# Patient Record
Sex: Male | Born: 1940 | Race: White | Hispanic: No | Marital: Single | Smoking: Former smoker
Health system: Southern US, Community
[De-identification: ages and names within clinical notes are randomized; demographics above are authoritative.]

## PROBLEM LIST (undated history)

## (undated) DIAGNOSIS — N189 Chronic kidney disease, unspecified: Secondary | ICD-10-CM

## (undated) DIAGNOSIS — E1122 Type 2 diabetes mellitus with diabetic chronic kidney disease: Secondary | ICD-10-CM

## (undated) DIAGNOSIS — M109 Gout, unspecified: Secondary | ICD-10-CM

## (undated) DIAGNOSIS — I129 Hypertensive chronic kidney disease with stage 1 through stage 4 chronic kidney disease, or unspecified chronic kidney disease: Secondary | ICD-10-CM

## (undated) DIAGNOSIS — E119 Type 2 diabetes mellitus without complications: Secondary | ICD-10-CM

## (undated) DIAGNOSIS — I10 Essential (primary) hypertension: Secondary | ICD-10-CM

## (undated) HISTORY — DX: Chronic kidney disease, unspecified: N18.9

## (undated) HISTORY — PX: OTHER SURGICAL HISTORY: SHX169

## (undated) HISTORY — DX: Type 2 diabetes mellitus without complications: E11.9

## (undated) HISTORY — DX: Gout, unspecified: M10.9

## (undated) HISTORY — DX: Essential (primary) hypertension: I10

## (undated) HISTORY — PX: APPENDECTOMY: SHX54

---

## 2004-11-01 ENCOUNTER — Ambulatory Visit: Payer: Self-pay | Admitting: Internal Medicine

## 2008-07-30 ENCOUNTER — Inpatient Hospital Stay: Payer: Self-pay | Admitting: Internal Medicine

## 2011-05-31 DIAGNOSIS — E119 Type 2 diabetes mellitus without complications: Secondary | ICD-10-CM | POA: Diagnosis not present

## 2011-05-31 DIAGNOSIS — I10 Essential (primary) hypertension: Secondary | ICD-10-CM | POA: Diagnosis not present

## 2011-05-31 DIAGNOSIS — E7800 Pure hypercholesterolemia, unspecified: Secondary | ICD-10-CM | POA: Diagnosis not present

## 2011-05-31 DIAGNOSIS — M109 Gout, unspecified: Secondary | ICD-10-CM | POA: Diagnosis not present

## 2011-05-31 DIAGNOSIS — N189 Chronic kidney disease, unspecified: Secondary | ICD-10-CM | POA: Diagnosis not present

## 2011-05-31 DIAGNOSIS — Z125 Encounter for screening for malignant neoplasm of prostate: Secondary | ICD-10-CM | POA: Diagnosis not present

## 2011-06-29 DIAGNOSIS — H1030 Unspecified acute conjunctivitis, unspecified eye: Secondary | ICD-10-CM | POA: Diagnosis not present

## 2011-08-13 DIAGNOSIS — E119 Type 2 diabetes mellitus without complications: Secondary | ICD-10-CM | POA: Diagnosis not present

## 2011-08-13 DIAGNOSIS — E7800 Pure hypercholesterolemia, unspecified: Secondary | ICD-10-CM | POA: Diagnosis not present

## 2011-08-13 DIAGNOSIS — Z125 Encounter for screening for malignant neoplasm of prostate: Secondary | ICD-10-CM | POA: Diagnosis not present

## 2011-08-13 DIAGNOSIS — Z0000 Encounter for general adult medical examination without abnormal findings: Secondary | ICD-10-CM | POA: Diagnosis not present

## 2011-08-13 DIAGNOSIS — I10 Essential (primary) hypertension: Secondary | ICD-10-CM | POA: Diagnosis not present

## 2011-08-13 DIAGNOSIS — N189 Chronic kidney disease, unspecified: Secondary | ICD-10-CM | POA: Diagnosis not present

## 2012-02-12 DIAGNOSIS — I10 Essential (primary) hypertension: Secondary | ICD-10-CM | POA: Diagnosis not present

## 2012-02-12 DIAGNOSIS — E785 Hyperlipidemia, unspecified: Secondary | ICD-10-CM | POA: Diagnosis not present

## 2012-02-12 DIAGNOSIS — E7800 Pure hypercholesterolemia, unspecified: Secondary | ICD-10-CM | POA: Diagnosis not present

## 2012-02-12 DIAGNOSIS — E119 Type 2 diabetes mellitus without complications: Secondary | ICD-10-CM | POA: Diagnosis not present

## 2012-03-12 DIAGNOSIS — E7800 Pure hypercholesterolemia, unspecified: Secondary | ICD-10-CM | POA: Diagnosis not present

## 2012-03-12 DIAGNOSIS — I10 Essential (primary) hypertension: Secondary | ICD-10-CM | POA: Diagnosis not present

## 2014-11-01 DIAGNOSIS — E7800 Pure hypercholesterolemia, unspecified: Secondary | ICD-10-CM

## 2014-11-01 DIAGNOSIS — E118 Type 2 diabetes mellitus with unspecified complications: Secondary | ICD-10-CM

## 2014-11-01 DIAGNOSIS — I129 Hypertensive chronic kidney disease with stage 1 through stage 4 chronic kidney disease, or unspecified chronic kidney disease: Secondary | ICD-10-CM

## 2014-11-01 DIAGNOSIS — E1122 Type 2 diabetes mellitus with diabetic chronic kidney disease: Secondary | ICD-10-CM | POA: Insufficient documentation

## 2014-11-01 DIAGNOSIS — I10 Essential (primary) hypertension: Secondary | ICD-10-CM

## 2014-11-01 DIAGNOSIS — M109 Gout, unspecified: Secondary | ICD-10-CM

## 2014-11-01 DIAGNOSIS — N181 Chronic kidney disease, stage 1: Secondary | ICD-10-CM

## 2014-11-01 DIAGNOSIS — N182 Chronic kidney disease, stage 2 (mild): Secondary | ICD-10-CM

## 2014-11-01 DIAGNOSIS — Z7984 Long term (current) use of oral hypoglycemic drugs: Secondary | ICD-10-CM | POA: Insufficient documentation

## 2014-11-01 DIAGNOSIS — Z7985 Long-term (current) use of injectable non-insulin antidiabetic drugs: Secondary | ICD-10-CM | POA: Insufficient documentation

## 2014-11-01 DIAGNOSIS — E1159 Type 2 diabetes mellitus with other circulatory complications: Secondary | ICD-10-CM | POA: Insufficient documentation

## 2014-11-02 ENCOUNTER — Ambulatory Visit (INDEPENDENT_AMBULATORY_CARE_PROVIDER_SITE_OTHER): Payer: Medicare PPO | Admitting: Family Medicine

## 2014-11-02 ENCOUNTER — Encounter: Payer: Self-pay | Admitting: Family Medicine

## 2014-11-02 VITALS — BP 117/71 | HR 62 | Temp 97.8°F | Ht 68.0 in | Wt 220.0 lb

## 2014-11-02 DIAGNOSIS — I10 Essential (primary) hypertension: Secondary | ICD-10-CM | POA: Diagnosis not present

## 2014-11-02 DIAGNOSIS — E7800 Pure hypercholesterolemia, unspecified: Secondary | ICD-10-CM

## 2014-11-02 DIAGNOSIS — Z125 Encounter for screening for malignant neoplasm of prostate: Secondary | ICD-10-CM

## 2014-11-02 DIAGNOSIS — Z0000 Encounter for general adult medical examination without abnormal findings: Secondary | ICD-10-CM

## 2014-11-02 DIAGNOSIS — N182 Chronic kidney disease, stage 2 (mild): Secondary | ICD-10-CM

## 2014-11-02 DIAGNOSIS — M1000 Idiopathic gout, unspecified site: Secondary | ICD-10-CM

## 2014-11-02 DIAGNOSIS — E118 Type 2 diabetes mellitus with unspecified complications: Secondary | ICD-10-CM | POA: Diagnosis not present

## 2014-11-02 LAB — URINALYSIS, ROUTINE W REFLEX MICROSCOPIC
Bilirubin, UA: NEGATIVE
Leukocytes, UA: NEGATIVE
Nitrite, UA: NEGATIVE
PH UA: 6.5 (ref 5.0–7.5)
RBC, UA: NEGATIVE
Specific Gravity, UA: 1.02 (ref 1.005–1.030)
Urobilinogen, Ur: 1 mg/dL (ref 0.2–1.0)

## 2014-11-02 LAB — BAYER DCA HB A1C WAIVED: HB A1C (BAYER DCA - WAIVED): 7.7 % — ABNORMAL HIGH (ref <7.0)

## 2014-11-02 MED ORDER — METFORMIN HCL 500 MG PO TABS: 500.0000 mg | ORAL_TABLET | Freq: Two times a day (BID) | ORAL | Status: DC

## 2014-11-02 NOTE — Progress Notes (Signed)
BP 117/71 mmHg  Pulse 62  Temp(Src) 97.8 F (36.6 C)  Ht 5\' 8"  (1.727 m)  Wt 220 lb (99.791 kg)  BMI 33.46 kg/m2  SpO2 99%   Subjective:    [name], male    DOB: [birth year], 74 y.o.   MRN: [id number]  HPI: Jack Crane is a 74 y.o. male  Chief Complaint  Patient presents with  . Annual Exam  doing well No BP meds as has done well after stopped drinking Lipid meds only meds taken no side effects No DM meds diet controled but home checks AM fasting low 100s In PM high 100s with some excursions into low 200s  Relevant past medical, surgical, family and social history reviewed and updated as indicated. Interim medical history since our last visit reviewed. Allergies and medications reviewed and updated.  Review of Systems  Constitutional: Negative.   HENT: Negative.   Eyes: Negative.   Respiratory: Negative.   Cardiovascular: Negative.   Endocrine: Negative.   Musculoskeletal: Negative.   Skin: Negative.   Allergic/Immunologic: Negative.   Neurological: Negative.   Hematological: Negative.   Psychiatric/Behavioral: Negative.     Per HPI unless specifically indicated above     Objective:    BP 117/71 mmHg  Pulse 62  Temp(Src) 97.8 F (36.6 C)  Ht 5\' 8"  (1.727 m)  Wt 220 lb (99.791 kg)  BMI 33.46 kg/m2  SpO2 99%  Wt Readings from Last 3 Encounters:  11/02/14 220 lb (99.791 kg)  02/24/14 221 lb (100.245 kg)    Physical Exam  Constitutional: He is oriented to person, place, and time. He appears well-developed and well-nourished.  HENT:  Head: Normocephalic and atraumatic.  Right Ear: External ear normal.  Left Ear: External ear normal.  Eyes: Conjunctivae and EOM are normal. Pupils are equal, round, and reactive to light.  Neck: Normal range of motion. Neck supple.  Cardiovascular: Normal rate, regular rhythm, normal heart sounds and intact distal pulses.   Pulmonary/Chest: Effort normal and breath sounds normal.  Abdominal:  Soft. Bowel sounds are normal. There is no splenomegaly or hepatomegaly.  Genitourinary: Rectum normal, prostate normal and penis normal.  Musculoskeletal: Normal range of motion.  Neurological: He is alert and oriented to person, place, and time. He has normal reflexes.  Skin: No rash noted. No erythema.  Psychiatric: He has a normal mood and affect. His behavior is normal. Judgment and thought content normal.    No results found for this or any previous visit.    Assessment & Plan:   Problem List Items Addressed This Visit      Cardiovascular and Mediastinum   Hypertension - Primary    Diet and lifestyle controled      Relevant Orders   Comprehensive metabolic panel   CBC with Differential/Platelet   Urinalysis, Routine w reflex microscopic (not at [facility])   TSH   PSA     Endocrine   Type 2 diabetes mellitus   Relevant Medications   metFORMIN (GLUCOPHAGE) 500 MG tablet   Other Relevant Orders   Comprehensive metabolic panel   CBC with Differential/Platelet   Bayer DCA Hb A1c Waived   Urinalysis, Routine w reflex microscopic (not at [facility])   TSH   PSA     Genitourinary   RESOLVED: CKD (chronic kidney disease), stage II    resolved      Relevant Orders   Comprehensive metabolic panel   CBC with Differential/Platelet   Urinalysis, Routine w reflex  microscopic (not at [facility]   TSH   PSA     Other   Gout    No sx no meds      Relevant Orders   Comprehensive metabolic panel   CBC with Differential/Platelet   Urinalysis, Routine w reflex microscopic (not at [facility])   Uric acid   TSH   PSA   Pure hypercholesterolemia    The current medical regimen is effective;  continue present plan and medications.       Relevant Orders   Comprehensive metabolic panel   CBC with Differential/Platelet   Urinalysis, Routine w reflex microscopic (not at [facility])   TSH   PSA    Other Visit Diagnoses    PE (physical exam), annual        Relevant Orders    Comprehensive  metabolic panel    CBC with Differential/Platelet    Urinalysis, Routine w reflex microscopic (not at [facility])    TSH    PSA        Follow up plan: Return in about 3 months (around 02/02/2015) for DM check and A1C.

## 2014-11-02 NOTE — Assessment & Plan Note (Signed)
Diet and lifestyle controled

## 2014-11-02 NOTE — Assessment & Plan Note (Signed)
No sx no meds

## 2014-11-02 NOTE — Assessment & Plan Note (Signed)
The current medical regimen is effective;  continue present plan and medications.  

## 2014-11-02 NOTE — Assessment & Plan Note (Signed)
resolved 

## 2014-11-03 LAB — PSA: Prostate Specific Ag, Serum: 2.3 ng/mL (ref 0.0–4.0)

## 2014-11-03 LAB — COMPREHENSIVE METABOLIC PANEL
ALBUMIN: 4.1 g/dL (ref 3.5–4.8)
ALT: 13 IU/L (ref 0–44)
AST: 16 IU/L (ref 0–40)
Albumin/Globulin Ratio: 1.7 (ref 1.1–2.5)
Alkaline Phosphatase: 82 IU/L (ref 39–117)
BUN/Creatinine Ratio: 17 (ref 10–22)
BUN: 15 mg/dL (ref 8–27)
Bilirubin Total: 0.6 mg/dL (ref 0.0–1.2)
CALCIUM: 9 mg/dL (ref 8.6–10.2)
CO2: 23 mmol/L (ref 18–29)
Chloride: 98 mmol/L (ref 97–108)
Creatinine, Ser: 0.87 mg/dL (ref 0.76–1.27)
GFR, EST AFRICAN AMERICAN: 99 mL/min/{1.73_m2} (ref >59)
GFR, EST NON AFRICAN AMERICAN: 86 mL/min/{1.73_m2} (ref >59)
Globulin, Total: 2.4 g/dL (ref 1.5–4.5)
Glucose: 179 mg/dL — ABNORMAL HIGH (ref 65–99)
Potassium: 4.7 mmol/L (ref 3.5–5.2)
Sodium: 139 mmol/L (ref 134–144)
Total Protein: 6.5 g/dL (ref 6.0–8.5)

## 2014-11-04 LAB — CBC WITH DIFFERENTIAL/PLATELET
BASOS ABS: 0 10*3/uL (ref 0.0–0.2)
Basos: 0 %
EOS (ABSOLUTE): 0.1 10*3/uL (ref 0.0–0.4)
Eos: 2 %
Hematocrit: 44.5 % (ref 37.5–51.0)
Hemoglobin: 14.5 g/dL (ref 12.6–17.7)
Immature Grans (Abs): 0 10*3/uL (ref 0.0–0.1)
Immature Granulocytes: 0 %
Lymphocytes Absolute: 1.5 10*3/uL (ref 0.7–3.1)
Lymphs: 27 %
MCH: 30.8 pg (ref 26.6–33.0)
MCHC: 32.6 g/dL (ref 31.5–35.7)
MCV: 95 fL (ref 79–97)
MONOCYTES: 10 %
Monocytes Absolute: 0.5 10*3/uL (ref 0.1–0.9)
NEUTROS ABS: 3.3 10*3/uL (ref 1.4–7.0)
Neutrophils: 61 %
PLATELETS: 203 10*3/uL (ref 150–379)
RBC: 4.71 x10E6/uL (ref 4.14–5.80)
RDW: 13.3 % (ref 12.3–15.4)
WBC: 5.5 10*3/uL (ref 3.4–10.8)

## 2014-11-04 LAB — TSH: TSH: 2.41 u[IU]/mL (ref 0.450–4.500)

## 2014-11-04 LAB — URIC ACID: Uric Acid: 5.8 mg/dL (ref 3.7–8.6)

## 2014-12-16 DIAGNOSIS — L570 Actinic keratosis: Secondary | ICD-10-CM | POA: Diagnosis not present

## 2014-12-16 DIAGNOSIS — L578 Other skin changes due to chronic exposure to nonionizing radiation: Secondary | ICD-10-CM | POA: Diagnosis not present

## 2015-02-02 ENCOUNTER — Encounter: Payer: Self-pay | Admitting: Family Medicine

## 2015-02-02 ENCOUNTER — Ambulatory Visit (INDEPENDENT_AMBULATORY_CARE_PROVIDER_SITE_OTHER): Payer: Medicare PPO | Admitting: Family Medicine

## 2015-02-02 VITALS — BP 122/75 | HR 65 | Temp 97.8°F | Ht 68.1 in | Wt 215.0 lb

## 2015-02-02 DIAGNOSIS — I10 Essential (primary) hypertension: Secondary | ICD-10-CM | POA: Diagnosis not present

## 2015-02-02 DIAGNOSIS — E119 Type 2 diabetes mellitus without complications: Secondary | ICD-10-CM

## 2015-02-02 DIAGNOSIS — E7800 Pure hypercholesterolemia, unspecified: Secondary | ICD-10-CM | POA: Diagnosis not present

## 2015-02-02 LAB — MICROALBUMIN, URINE WAIVED
CREATININE, URINE WAIVED: 200 mg/dL (ref 10–300)
Microalb, Ur Waived: 30 mg/L — ABNORMAL HIGH (ref 0–19)

## 2015-02-02 LAB — BAYER DCA HB A1C WAIVED: HB A1C (BAYER DCA - WAIVED): 6.1 % (ref <7.0)

## 2015-02-02 MED ORDER — METFORMIN HCL 500 MG PO TABS: 500.0000 mg | ORAL_TABLET | Freq: Two times a day (BID) | ORAL | Status: DC

## 2015-02-02 NOTE — Assessment & Plan Note (Signed)
The current medical regimen is effective;  continue present plan and medications.  

## 2015-02-02 NOTE — Progress Notes (Signed)
BP 122/75 mmHg  Pulse 65  Temp(Src) 97.8 F (36.6 C)  Ht 5' 8.1" (1.73 m)  Wt 215 lb (97.523 kg)  BMI 32.58 kg/m2  SpO2 99%   Subjective:    [name], male    DOB: [birth year], 74 y.o.   MRN: [id number]  HPI: Jack Crane is a 74 y.o. male  Chief Complaint  Patient presents with  . Diabetes   patient's diabetes home glucose monitoring indicating excellent control A1c today of 6.1 patient with no low blood sugar spells doing very well. Taking Lipitor without problems no complaints. Takes medications every day without fail  Relevant past medical, surgical, family and social history reviewed and updated as indicated. Interim medical history since our last visit reviewed. Allergies and medications reviewed and updated.  Review of Systems  Constitutional: Negative.   Respiratory: Negative.   Cardiovascular: Negative.     Per HPI unless specifically indicated above     Objective:    BP 122/75 mmHg  Pulse 65  Temp(Src) 97.8 F (36.6 C)  Ht 5' 8.1" (1.73 m)  Wt 215 lb (97.523 kg)  BMI 32.58 kg/m2  SpO2 99%  Wt Readings from Last 3 Encounters:  02/02/15 215 lb (97.523 kg)  11/02/14 220 lb (99.791 kg)  02/24/14 221 lb (100.245 kg)    Physical Exam  Constitutional: He is oriented to person, place, and time. He appears well-developed and well-nourished. No distress.  HENT:  Head: Normocephalic and atraumatic.  Right Ear: Hearing normal.  Left Ear: Hearing normal.  Nose: Nose normal.  Eyes: Conjunctivae and lids are normal. Right eye exhibits no discharge. Left eye exhibits no discharge. No scleral icterus.  Cardiovascular: Normal rate, regular rhythm and normal heart sounds.   Pulmonary/Chest: Effort normal and breath sounds normal. No respiratory distress.  Musculoskeletal: Normal range of motion.  Neurological: He is alert and oriented to person, place, and time.  Skin: Skin is intact. No rash noted.  Psychiatric: He has a normal mood and  affect. His speech is normal and behavior is normal. Judgment and thought content normal. Cognition and memory are normal.    Results for orders placed or performed in visit on 11/02/14  Comprehensive metabolic panel  Result Value Ref Range   Glucose 179 (H) 65 - 99 mg/dL   BUN 15 8 - 27 mg/dL   Creatinine, Ser 0.87 0.76 - 1.27 mg/dL   GFR calc non Af Amer 86 >59 mL/min/1.73   GFR calc Af Amer 99 >59 mL/min/1.73   BUN/Creatinine Ratio 17 10 - 22   Sodium 139 134 - 144 mmol/L   Potassium 4.7 3.5 - 5.2 mmol/L   Chloride 98 97 - 108 mmol/L   CO2 23 18 - 29 mmol/L   Calcium 9.0 8.6 - 10.2 mg/dL   Total Protein 6.5 6.0 - 8.5 g/dL   Albumin 4.1 3.5 - 4.8 g/dL   Globulin, Total 2.4 1.5 - 4.5 g/dL   Albumin/Globulin Ratio 1.7 1.1 - 2.5   Bilirubin Total 0.6 0.0 - 1.2 mg/dL   Alkaline Phosphatase 82 39 - 117 IU/L   AST 16 0 - 40 IU/L   ALT 13 0 - 44 IU/L  CBC with Differential/Platelet  Result Value Ref Range   WBC 5.5 3.4 - 10.8 x10E3/uL   RBC 4.71 4.14 - 5.80 x10E6/uL   Hemoglobin 14.5 12.6 - 17.7 g/dL   Hematocrit 44.5 37.5 - 51.0 %   MCV 95 79 - 97 fL  MCH 30.8 26.6 - 33.0 pg   MCHC 32.6 31.5 - 35.7 g/dL   RDW 13.3 12.3 - 15.4 %   Platelets 203 150 - 379 x10E3/uL   Neutrophils 61 %   Lymphs 27 %   Monocytes 10 %   Eos 2 %   Basos 0 %   Neutrophils Absolute 3.3 1.4 - 7.0 x10E3/uL   Lymphocytes Absolute 1.5 0.7 - 3.1 x10E3/uL   Monocytes Absolute 0.5 0.1 - 0.9 x10E3/uL   EOS (ABSOLUTE) 0.1 0.0 - 0.4 x10E3/uL   Basophils Absolute 0.0 0.0 - 0.2 x10E3/uL   Immature Granulocytes 0 %   Immature Grans (Abs) 0.0 0.0 - 0.1 x10E3/uL  Bayer DCA Hb A1c Waived  Result Value Ref Range   Bayer DCA Hb A1c Waived 7.7 (H) <7.0 %  Urinalysis, Routine w reflex microscopic (not at [facility])  Result Value Ref Range   Specific Gravity, UA 1.020 1.005 - 1.030   pH, UA 6.5 5.0 - 7.5   Color, UA Yellow Yellow   Appearance Ur Clear Clear   Leukocytes, UA Negative Negative   Protein, UA Trace  Negative/Trace   Glucose, UA Trace (A) Negative   Ketones, UA Trace (A) Negative   RBC, UA Negative Negative   Bilirubin, UA Negative Negative   Urobilinogen, Ur 1.0 0.2 - 1.0 mg/dL   Nitrite, UA Negative Negative  Uric acid  Result Value Ref Range   Uric Acid 5.8 3.7 - 8.6 mg/dL  TSH  Result Value Ref Range   TSH 2.410 0.450 - 4.500 uIU/mL  PSA  Result Value Ref Range   Prostate Specific Ag, Serum 2.3 0.0 - 4.0 ng/mL      Assessment & Plan:   Problem List Items Addressed This Visit      Cardiovascular and Mediastinum   Hypertension    The current medical regimen is effective;  continue present plan and medications.         Endocrine   Type 2 diabetes mellitus ([place])    The current medical regimen is effective;  continue present plan and medications.       Relevant Medications   metFORMIN (GLUCOPHAGE) 500 MG tablet     Other   Pure hypercholesterolemia    The current medical regimen is effective;  continue present plan and medications.        Other Visit Diagnoses    Diabetes mellitus without complication ([place])    -  Primary    Relevant Medications    metFORMIN (GLUCOPHAGE) 500 MG tablet    Other Relevant Orders    Bayer DCA Hb A1c Waived    Microalbumin, Urine Waived        Follow up plan: Return in about 3 months (around 05/05/2015) for Recheck diabetes A1c, BMP, lipid panel, ALT, AST.

## 2015-02-03 ENCOUNTER — Telehealth: Payer: Self-pay | Admitting: Family Medicine

## 2015-02-03 NOTE — Telephone Encounter (Signed)
Documented in chart.

## 2015-02-03 NOTE — Telephone Encounter (Signed)
Pt came in stated he does not need a colonoscopy done. He had one 10/16/12 with Vella Kohler, MD. Pt brought record a copy of discharge instructions. Please call if this is still needed. Thanks.

## 2015-02-04 DIAGNOSIS — E119 Type 2 diabetes mellitus without complications: Secondary | ICD-10-CM | POA: Diagnosis not present

## 2015-02-04 LAB — HM DIABETES EYE EXAM

## 2015-04-22 DIAGNOSIS — J209 Acute bronchitis, unspecified: Secondary | ICD-10-CM | POA: Diagnosis not present

## 2015-05-26 ENCOUNTER — Ambulatory Visit (INDEPENDENT_AMBULATORY_CARE_PROVIDER_SITE_OTHER): Payer: Medicare PPO | Admitting: Family Medicine

## 2015-05-26 ENCOUNTER — Encounter: Payer: Self-pay | Admitting: Family Medicine

## 2015-05-26 VITALS — BP 107/70 | HR 66 | Temp 97.4°F | Ht 68.7 in | Wt 215.0 lb

## 2015-05-26 DIAGNOSIS — E119 Type 2 diabetes mellitus without complications: Secondary | ICD-10-CM | POA: Diagnosis not present

## 2015-05-26 DIAGNOSIS — I10 Essential (primary) hypertension: Secondary | ICD-10-CM

## 2015-05-26 DIAGNOSIS — E7800 Pure hypercholesterolemia, unspecified: Secondary | ICD-10-CM

## 2015-05-26 LAB — LP+ALT+AST PICCOLO, WAIVED
ALT (SGPT) Piccolo, Waived: 23 U/L (ref 10–47)
AST (SGOT) Piccolo, Waived: 26 U/L (ref 11–38)
CHOLESTEROL PICCOLO, WAIVED: 114 mg/dL (ref <200)
Chol/HDL Ratio Piccolo,Waive: 3.2 mg/dL
HDL CHOL PICCOLO, WAIVED: 36 mg/dL — AB (ref >59)
LDL CHOL CALC PICCOLO WAIVED: 55 mg/dL (ref <100)
TRIGLYCERIDES PICCOLO,WAIVED: 117 mg/dL (ref <150)
VLDL CHOL CALC PICCOLO,WAIVE: 23 mg/dL (ref <30)

## 2015-05-26 LAB — BAYER DCA HB A1C WAIVED: HB A1C (BAYER DCA - WAIVED): 6.4 % (ref <7.0)

## 2015-05-26 MED ORDER — TRIAMCINOLONE ACETONIDE 55 MCG/ACT NA AERO: 2.0000 | INHALATION_SPRAY | Freq: Every day | NASAL | Status: DC

## 2015-05-26 NOTE — Assessment & Plan Note (Signed)
The current medical regimen is effective;  continue present plan and medications.  

## 2015-05-26 NOTE — Assessment & Plan Note (Signed)
Diet controled 

## 2015-05-26 NOTE — Progress Notes (Signed)
BP 107/70 mmHg  Pulse 66  Temp(Src) 97.4 F (36.3 C)  Ht 5' 8.7" (1.745 m)  Wt 215 lb (97.523 kg)  BMI 32.03 kg/m2  SpO2 98%   Subjective:    [name], male    DOB: August 10, 1940, 76 y.o.   MRN: [id number]  HPI: Jack Crane is a 75 y.o. male  Chief Complaint  Patient presents with  . Diabetes  . Hypertension  . Hyperlipidemia   Patient all in all doing well had a cold earlier this year which is resolved but still has some nasal drip which is bothersome. Blood pressures remained good not taking medications Blood sugars doing well with no low blood sugar spells Taking cholesterol medicine without problems or issues No side effects and taking medications faithfully No gout signs or symptoms.  Relevant past medical, surgical, family and social history reviewed and updated as indicated. Interim medical history since our last visit reviewed. Allergies and medications reviewed and updated.  Review of Systems  Constitutional: Negative.   Respiratory: Negative.   Cardiovascular: Negative.     Per HPI unless specifically indicated above     Objective:    BP 107/70 mmHg  Pulse 66  Temp(Src) 97.4 F (36.3 C)  Ht 5' 8.7" (1.745 m)  Wt 215 lb (97.523 kg)  BMI 32.03 kg/m2  SpO2 98%  Wt Readings from Last 3 Encounters:  05/26/15 215 lb (97.523 kg)  02/02/15 215 lb (97.523 kg)  11/02/14 220 lb (99.791 kg)    Physical Exam  Constitutional: He is oriented to person, place, and time. He appears well-developed and well-nourished. No distress.  HENT:  Head: Normocephalic and atraumatic.  Right Ear: Hearing normal.  Left Ear: Hearing normal.  Nose: Nose normal.  Eyes: Conjunctivae and lids are normal. Right eye exhibits no discharge. Left eye exhibits no discharge. No scleral icterus.  Cardiovascular: Normal rate, regular rhythm and normal heart sounds.   Pulmonary/Chest: Effort normal and breath sounds normal. No respiratory distress.   Musculoskeletal: Normal range of motion.  Neurological: He is alert and oriented to person, place, and time.  Skin: Skin is intact. No rash noted.  Psychiatric: He has a normal mood and affect. His speech is normal and behavior is normal. Judgment and thought content normal. Cognition and memory are normal.    Results for orders placed or performed in visit on 02/02/15  Bayer DCA Hb A1c Waived  Result Value Ref Range   Bayer DCA Hb A1c Waived 6.1 <7.0 %  Microalbumin, Urine Waived  Result Value Ref Range   Microalb, Ur Waived 30 (H) 0 - 19 mg/L   Creatinine, Urine Waived 200 10 - 300 mg/dL   Microalb/Creat Ratio <30 <30 mg/g      Assessment & Plan:   Problem List Items Addressed This Visit      Cardiovascular and Mediastinum   Hypertension    Diet controled      Relevant Orders   Basic metabolic panel     Endocrine   Type 2 diabetes mellitus ([name]) - Primary    The current medical regimen is effective;  continue present plan and medications.       Relevant Orders   Bayer DCA Hb A1c Waived     Other   Pure hypercholesterolemia    The current medical regimen is effective;  continue present plan and medications.       Relevant Orders   [address], Waived  Follow up plan: Return in about 3 months (around 08/23/2015) for a1c.

## 2015-05-27 LAB — BASIC METABOLIC PANEL
BUN/Creatinine Ratio: 15 (ref 10–22)
BUN: 15 mg/dL (ref 8–27)
CALCIUM: 9.3 mg/dL (ref 8.6–10.2)
CO2: 25 mmol/L (ref 18–29)
Chloride: 103 mmol/L (ref 96–106)
Creatinine, Ser: 0.99 mg/dL (ref 0.76–1.27)
GFR, EST AFRICAN AMERICAN: 86 mL/min/{1.73_m2} (ref >59)
GFR, EST NON AFRICAN AMERICAN: 75 mL/min/{1.73_m2} (ref >59)
Glucose: 183 mg/dL — ABNORMAL HIGH (ref 65–99)
Potassium: 4.7 mmol/L (ref 3.5–5.2)
Sodium: 145 mmol/L — ABNORMAL HIGH (ref 134–144)

## 2015-05-28 ENCOUNTER — Encounter: Payer: Self-pay | Admitting: Family Medicine

## 2015-06-29 DIAGNOSIS — L820 Inflamed seborrheic keratosis: Secondary | ICD-10-CM | POA: Diagnosis not present

## 2015-06-29 DIAGNOSIS — L821 Other seborrheic keratosis: Secondary | ICD-10-CM | POA: Diagnosis not present

## 2015-06-29 DIAGNOSIS — L578 Other skin changes due to chronic exposure to nonionizing radiation: Secondary | ICD-10-CM | POA: Diagnosis not present

## 2015-06-29 DIAGNOSIS — D692 Other nonthrombocytopenic purpura: Secondary | ICD-10-CM | POA: Diagnosis not present

## 2015-06-29 DIAGNOSIS — L570 Actinic keratosis: Secondary | ICD-10-CM | POA: Diagnosis not present

## 2015-08-24 ENCOUNTER — Encounter: Payer: Self-pay | Admitting: Family Medicine

## 2015-08-24 ENCOUNTER — Ambulatory Visit (INDEPENDENT_AMBULATORY_CARE_PROVIDER_SITE_OTHER): Payer: Medicare PPO | Admitting: Family Medicine

## 2015-08-24 VITALS — BP 128/66 | HR 59 | Temp 97.7°F | Ht 68.7 in | Wt 222.0 lb

## 2015-08-24 DIAGNOSIS — M1000 Idiopathic gout, unspecified site: Secondary | ICD-10-CM

## 2015-08-24 DIAGNOSIS — E7800 Pure hypercholesterolemia, unspecified: Secondary | ICD-10-CM

## 2015-08-24 DIAGNOSIS — I10 Essential (primary) hypertension: Secondary | ICD-10-CM | POA: Diagnosis not present

## 2015-08-24 DIAGNOSIS — E119 Type 2 diabetes mellitus without complications: Secondary | ICD-10-CM | POA: Diagnosis not present

## 2015-08-24 LAB — HEMOGLOBIN A1C: HEMOGLOBIN A1C: 6

## 2015-08-24 LAB — BAYER DCA HB A1C WAIVED: HB A1C (BAYER DCA - WAIVED): 6.6 % (ref <7.0)

## 2015-08-24 NOTE — Progress Notes (Signed)
   BP 128/66 mmHg  Pulse 59  Temp(Src) 97.7 F (36.5 C)  Ht 5' 8.7" (1.745 m)  Wt 222 lb (100.699 kg)  BMI 33.07 kg/m2  SpO2 96%   Subjective:    [name], male    DOB: [birth year], 75 y.o.   MRN: [id number]  HPI: Jack Crane is a 75 y.o. male  Chief Complaint  Patient presents with  . Diabetes   Patient recheck doing well noted low blood sugar spells no issues with metformin taking faithfully without side effects Cholesterol doing well no complaints from medication taken faithfully Blood pressure remains to be well controlled not taking any medications Relevant past medical, surgical, family and social history reviewed and updated as indicated. Interim medical history since our last visit reviewed. Allergies and medications reviewed and updated.  Review of Systems  Constitutional: Negative.   Respiratory: Negative.   Cardiovascular: Negative.     Per HPI unless specifically indicated above     Objective:    BP 128/66 mmHg  Pulse 59  Temp(Src) 97.7 F (36.5 C)  Ht 5' 8.7" (1.745 m)  Wt 222 lb (100.699 kg)  BMI 33.07 kg/m2  SpO2 96%  Wt Readings from Last 3 Encounters:  08/24/15 222 lb (100.699 kg)  05/26/15 215 lb (97.523 kg)  02/02/15 215 lb (97.523 kg)    Physical Exam  Constitutional: He is oriented to person, place, and time. He appears well-developed and well-nourished. No distress.  HENT:  Head: Normocephalic and atraumatic.  Right Ear: Hearing normal.  Left Ear: Hearing normal.  Nose: Nose normal.  Eyes: Conjunctivae and lids are normal. Right eye exhibits no discharge. Left eye exhibits no discharge. No scleral icterus.  Cardiovascular: Normal rate, regular rhythm and normal heart sounds.   Pulmonary/Chest: Effort normal and breath sounds normal. No respiratory distress.  Musculoskeletal: Normal range of motion.  Neurological: He is alert and oriented to person, place, and time.  Skin: Skin is intact. No rash noted.   Psychiatric: He has a normal mood and affect. His speech is normal and behavior is normal. Judgment and thought content normal. Cognition and memory are normal.    Results for orders placed or performed in visit on 08/24/15  HM DIABETES EYE EXAM  Result Value Ref Range   HM Diabetic Eye Exam No Retinopathy No Retinopathy      Assessment & Plan:   Problem List Items Addressed This Visit      Cardiovascular and Mediastinum   Hypertension    controled        Endocrine   Type 2 diabetes mellitus ([place]) - Primary    The current medical regimen is effective;  continue present plan and medications.       Relevant Orders   HM DIABETES EYE EXAM (Completed)   Bayer DCA Hb A1c Waived     Other   Gout    No gout symptoms      Pure hypercholesterolemia    The current medical regimen is effective;  continue present plan and medications.           Follow up plan: Return in about 3 months (around 11/24/2015) for Physical Exam a1c, PE labs, uric acid.

## 2015-08-24 NOTE — Assessment & Plan Note (Signed)
The current medical regimen is effective;  continue present plan and medications.  

## 2015-08-24 NOTE — Assessment & Plan Note (Signed)
controled

## 2015-08-24 NOTE — Assessment & Plan Note (Signed)
No gout symptoms 

## 2015-10-29 ENCOUNTER — Other Ambulatory Visit: Payer: Self-pay | Admitting: Family Medicine

## 2015-11-03 DIAGNOSIS — L719 Rosacea, unspecified: Secondary | ICD-10-CM | POA: Diagnosis not present

## 2015-11-03 DIAGNOSIS — L578 Other skin changes due to chronic exposure to nonionizing radiation: Secondary | ICD-10-CM | POA: Diagnosis not present

## 2015-11-03 DIAGNOSIS — L820 Inflamed seborrheic keratosis: Secondary | ICD-10-CM | POA: Diagnosis not present

## 2015-11-03 DIAGNOSIS — L570 Actinic keratosis: Secondary | ICD-10-CM | POA: Diagnosis not present

## 2015-12-01 ENCOUNTER — Encounter: Payer: Self-pay | Admitting: Family Medicine

## 2015-12-01 ENCOUNTER — Ambulatory Visit (INDEPENDENT_AMBULATORY_CARE_PROVIDER_SITE_OTHER): Payer: Medicare PPO | Admitting: Family Medicine

## 2015-12-01 VITALS — BP 122/68 | HR 61 | Temp 97.8°F | Ht 68.7 in | Wt 222.0 lb

## 2015-12-01 DIAGNOSIS — Z0000 Encounter for general adult medical examination without abnormal findings: Secondary | ICD-10-CM | POA: Diagnosis not present

## 2015-12-01 DIAGNOSIS — E7800 Pure hypercholesterolemia, unspecified: Secondary | ICD-10-CM | POA: Diagnosis not present

## 2015-12-01 DIAGNOSIS — E119 Type 2 diabetes mellitus without complications: Secondary | ICD-10-CM

## 2015-12-01 DIAGNOSIS — I10 Essential (primary) hypertension: Secondary | ICD-10-CM

## 2015-12-01 DIAGNOSIS — M1000 Idiopathic gout, unspecified site: Secondary | ICD-10-CM | POA: Diagnosis not present

## 2015-12-01 LAB — URINALYSIS, ROUTINE W REFLEX MICROSCOPIC
Bilirubin, UA: NEGATIVE
Glucose, UA: NEGATIVE
Ketones, UA: NEGATIVE
LEUKOCYTES UA: NEGATIVE
NITRITE UA: NEGATIVE
PH UA: 5.5 (ref 5.0–7.5)
Specific Gravity, UA: 1.02 (ref 1.005–1.030)
Urobilinogen, Ur: 0.2 mg/dL (ref 0.2–1.0)

## 2015-12-01 LAB — MICROALBUMIN, URINE WAIVED
CREATININE, URINE WAIVED: 200 mg/dL (ref 10–300)
Microalb, Ur Waived: 30 mg/L — ABNORMAL HIGH (ref 0–19)

## 2015-12-01 LAB — MICROSCOPIC EXAMINATION
EPITHELIAL CELLS (NON RENAL): NONE SEEN /HPF (ref 0–10)
WBC, UA: NONE SEEN /hpf (ref 0–?)

## 2015-12-01 LAB — HEMOGLOBIN A1C: HEMOGLOBIN A1C: 7.4

## 2015-12-01 LAB — MICROALBUMIN, URINE: MICROALB UR: 30

## 2015-12-01 LAB — BAYER DCA HB A1C WAIVED: HB A1C (BAYER DCA - WAIVED): 7.4 % — ABNORMAL HIGH (ref <7.0)

## 2015-12-01 MED ORDER — ATORVASTATIN CALCIUM 40 MG PO TABS: 40.0000 mg | ORAL_TABLET | Freq: Every day | ORAL | 4 refills | Status: DC

## 2015-12-01 MED ORDER — METFORMIN HCL 500 MG PO TABS: 500.0000 mg | ORAL_TABLET | Freq: Two times a day (BID) | ORAL | 4 refills | Status: DC

## 2015-12-01 NOTE — Assessment & Plan Note (Signed)
The current medical regimen is effective;  continue present plan and medications.  

## 2015-12-01 NOTE — Progress Notes (Signed)
BP 122/68 (BP Location: Right Arm, Patient Position: Sitting, Cuff Size: Small)   Pulse 61   Temp 97.8 F (36.6 C)   Ht 5' 8.7" (1.745 m)   Wt 222 lb (100.7 kg)   SpO2 95%   BMI 33.07 kg/m    Subjective:    [name], male    DOB: 1940/07/11, 75 y.o.   MRN: [id number]  HPI: Jack Crane is a 75 y.o. male  Chief Complaint  Patient presents with  . Annual Exam  . Diabetes  . Gout   Patient all in all doing well noted low blood sugar spells no issues with medications. Home glucose checks generally in the evening sometimes of been as high as 200 mostly in the mid 100s. All in all doing well Cholesterol no issues with Lipitor No further gout symptoms Not taking any gout medicines Relevant past medical, surgical, family and social history reviewed and updated as indicated. Interim medical history since our last visit reviewed. Allergies and medications reviewed and updated.  Review of Systems  Constitutional: Negative.   HENT: Negative.   Eyes: Negative.   Respiratory: Negative.   Cardiovascular: Negative.   Gastrointestinal: Negative.   Endocrine: Negative.   Genitourinary: Negative.   Musculoskeletal: Negative.   Skin: Negative.   Allergic/Immunologic: Negative.   Neurological: Negative.   Hematological: Negative.   Psychiatric/Behavioral: Negative.     Per HPI unless specifically indicated above     Objective:    BP 122/68 (BP Location: Right Arm, Patient Position: Sitting, Cuff Size: Small)   Pulse 61   Temp 97.8 F (36.6 C)   Ht 5' 8.7" (1.745 m)   Wt 222 lb (100.7 kg)   SpO2 95%   BMI 33.07 kg/m   Wt Readings from Last 3 Encounters:  12/01/15 222 lb (100.7 kg)  08/24/15 222 lb (100.7 kg)  05/26/15 215 lb (97.5 kg)    Physical Exam  Constitutional: He is oriented to person, place, and time. He appears well-developed and well-nourished.  HENT:  Head: Normocephalic and atraumatic.  Right Ear: External ear normal.  Left Ear:  External ear normal.  Eyes: Conjunctivae and EOM are normal. Pupils are equal, round, and reactive to light.  Neck: Normal range of motion. Neck supple.  Cardiovascular: Normal rate, regular rhythm, normal heart sounds and intact distal pulses.   Pulmonary/Chest: Effort normal and breath sounds normal.  Abdominal: Soft. Bowel sounds are normal. There is no splenomegaly or hepatomegaly.  Genitourinary: Rectum normal, prostate normal and penis normal.  Musculoskeletal: Normal range of motion.  Neurological: He is alert and oriented to person, place, and time. He has normal reflexes.  Skin: No rash noted. No erythema.  Psychiatric: He has a normal mood and affect. His behavior is normal. Judgment and thought content normal.    Results for orders placed or performed in visit on 12/01/15  Microalbumin, urine  Result Value Ref Range   Microalb, Ur 30   Hemoglobin A1c  Result Value Ref Range   Hemoglobin A1C 7.4       Assessment & Plan:   Problem List Items Addressed This Visit      Cardiovascular and Mediastinum   Hypertension    The current medical regimen is effective;  continue present plan and medications.       Relevant Medications   atorvastatin (LIPITOR) 40 MG tablet     Endocrine   Type 2 diabetes mellitus ([facility])    A1c slightly elevated but  all in all doing well      Relevant Medications   metFORMIN (GLUCOPHAGE) 500 MG tablet   atorvastatin (LIPITOR) 40 MG tablet   Other Relevant Orders   Microalbumin, Urine Waived   Bayer DCA Hb A1c Waived     Other   Gout    No symptoms uric acid level pending      Relevant Orders   Uric acid   Pure hypercholesterolemia    The current medical regimen is effective;  continue present plan and medications.       Relevant Medications   atorvastatin (LIPITOR) 40 MG tablet    Other Visit Diagnoses    Well adult exam    -  Primary   Relevant Orders   CBC with Differential/Platelet   Comprehensive metabolic panel   Lipid  Panel w/o Chol/HDL Ratio   PSA   TSH   Urinalysis, Routine w reflex microscopic (not at [facility])       Follow up plan: Return in about 3 months (around 03/02/2016) for Hemoglobin A1c.

## 2015-12-01 NOTE — Assessment & Plan Note (Signed)
No symptoms uric acid level pending

## 2015-12-01 NOTE — Assessment & Plan Note (Signed)
A1c slightly elevated but all in all doing well

## 2015-12-02 LAB — CBC WITH DIFFERENTIAL/PLATELET
BASOS ABS: 0 10*3/uL (ref 0.0–0.2)
BASOS: 1 %
EOS (ABSOLUTE): 0.2 10*3/uL (ref 0.0–0.4)
EOS: 3 %
HEMATOCRIT: 44.1 % (ref 37.5–51.0)
HEMOGLOBIN: 14.8 g/dL (ref 12.6–17.7)
IMMATURE GRANS (ABS): 0 10*3/uL (ref 0.0–0.1)
Immature Granulocytes: 0 %
LYMPHS: 25 %
Lymphocytes Absolute: 1.3 10*3/uL (ref 0.7–3.1)
MCH: 31 pg (ref 26.6–33.0)
MCHC: 33.6 g/dL (ref 31.5–35.7)
MCV: 92 fL (ref 79–97)
MONOCYTES: 11 %
Monocytes Absolute: 0.6 10*3/uL (ref 0.1–0.9)
NEUTROS ABS: 3 10*3/uL (ref 1.4–7.0)
Neutrophils: 60 %
Platelets: 182 10*3/uL (ref 150–379)
RBC: 4.78 x10E6/uL (ref 4.14–5.80)
RDW: 13.8 % (ref 12.3–15.4)
WBC: 5.1 10*3/uL (ref 3.4–10.8)

## 2015-12-02 LAB — URIC ACID: URIC ACID: 6.8 mg/dL (ref 3.7–8.6)

## 2015-12-02 LAB — COMPREHENSIVE METABOLIC PANEL
ALBUMIN: 4.2 g/dL (ref 3.5–4.8)
ALK PHOS: 78 IU/L (ref 39–117)
ALT: 22 IU/L (ref 0–44)
AST: 26 IU/L (ref 0–40)
Albumin/Globulin Ratio: 1.5 (ref 1.2–2.2)
BUN / CREAT RATIO: 13 (ref 10–24)
BUN: 13 mg/dL (ref 8–27)
Bilirubin Total: 0.6 mg/dL (ref 0.0–1.2)
CO2: 28 mmol/L (ref 18–29)
CREATININE: 1 mg/dL (ref 0.76–1.27)
Calcium: 9.5 mg/dL (ref 8.6–10.2)
Chloride: 103 mmol/L (ref 96–106)
GFR calc Af Amer: 85 mL/min/{1.73_m2} (ref >59)
GFR, EST NON AFRICAN AMERICAN: 74 mL/min/{1.73_m2} (ref >59)
GLUCOSE: 115 mg/dL — AB (ref 65–99)
Globulin, Total: 2.8 g/dL (ref 1.5–4.5)
Potassium: 5.7 mmol/L — ABNORMAL HIGH (ref 3.5–5.2)
SODIUM: 145 mmol/L — AB (ref 134–144)
TOTAL PROTEIN: 7 g/dL (ref 6.0–8.5)

## 2015-12-02 LAB — LIPID PANEL W/O CHOL/HDL RATIO
CHOLESTEROL TOTAL: 127 mg/dL (ref 100–199)
HDL: 34 mg/dL — ABNORMAL LOW (ref >39)
LDL CALC: 71 mg/dL (ref 0–99)
TRIGLYCERIDES: 110 mg/dL (ref 0–149)
VLDL CHOLESTEROL CAL: 22 mg/dL (ref 5–40)

## 2015-12-02 LAB — TSH: TSH: 2.95 u[IU]/mL (ref 0.450–4.500)

## 2015-12-02 LAB — PSA: PROSTATE SPECIFIC AG, SERUM: 2.5 ng/mL (ref 0.0–4.0)

## 2015-12-05 ENCOUNTER — Telehealth: Payer: Self-pay | Admitting: Family Medicine

## 2015-12-05 DIAGNOSIS — E875 Hyperkalemia: Secondary | ICD-10-CM

## 2015-12-05 NOTE — Telephone Encounter (Signed)
Phone call Discussed with patient elevated potassium will recheck BMP at some point in the next week or so patient not doing next her potassium supplements.

## 2015-12-06 ENCOUNTER — Other Ambulatory Visit: Payer: Medicare PPO

## 2015-12-06 DIAGNOSIS — E875 Hyperkalemia: Secondary | ICD-10-CM | POA: Diagnosis not present

## 2015-12-07 ENCOUNTER — Encounter: Payer: Self-pay | Admitting: Family Medicine

## 2015-12-07 LAB — BASIC METABOLIC PANEL
BUN/Creatinine Ratio: 12 (ref 10–24)
BUN: 13 mg/dL (ref 8–27)
CALCIUM: 8.7 mg/dL (ref 8.6–10.2)
CHLORIDE: 102 mmol/L (ref 96–106)
CO2: 24 mmol/L (ref 18–29)
Creatinine, Ser: 1.06 mg/dL (ref 0.76–1.27)
GFR calc non Af Amer: 69 mL/min/{1.73_m2} (ref >59)
GFR, EST AFRICAN AMERICAN: 80 mL/min/{1.73_m2} (ref >59)
GLUCOSE: 162 mg/dL — AB (ref 65–99)
POTASSIUM: 4.2 mmol/L (ref 3.5–5.2)
Sodium: 143 mmol/L (ref 134–144)

## 2016-02-09 DIAGNOSIS — L718 Other rosacea: Secondary | ICD-10-CM | POA: Diagnosis not present

## 2016-02-09 DIAGNOSIS — L578 Other skin changes due to chronic exposure to nonionizing radiation: Secondary | ICD-10-CM | POA: Diagnosis not present

## 2016-02-09 DIAGNOSIS — L820 Inflamed seborrheic keratosis: Secondary | ICD-10-CM | POA: Diagnosis not present

## 2016-02-09 DIAGNOSIS — L570 Actinic keratosis: Secondary | ICD-10-CM | POA: Diagnosis not present

## 2016-02-09 DIAGNOSIS — L821 Other seborrheic keratosis: Secondary | ICD-10-CM | POA: Diagnosis not present

## 2016-03-07 ENCOUNTER — Ambulatory Visit (INDEPENDENT_AMBULATORY_CARE_PROVIDER_SITE_OTHER): Payer: Medicare PPO | Admitting: Family Medicine

## 2016-03-07 ENCOUNTER — Encounter: Payer: Self-pay | Admitting: Family Medicine

## 2016-03-07 VITALS — BP 128/80 | HR 66 | Temp 97.4°F | Wt 224.4 lb

## 2016-03-07 DIAGNOSIS — M1000 Idiopathic gout, unspecified site: Secondary | ICD-10-CM | POA: Diagnosis not present

## 2016-03-07 DIAGNOSIS — E7800 Pure hypercholesterolemia, unspecified: Secondary | ICD-10-CM | POA: Diagnosis not present

## 2016-03-07 DIAGNOSIS — E118 Type 2 diabetes mellitus with unspecified complications: Secondary | ICD-10-CM

## 2016-03-07 DIAGNOSIS — I10 Essential (primary) hypertension: Secondary | ICD-10-CM

## 2016-03-07 LAB — BAYER DCA HB A1C WAIVED: HB A1C (BAYER DCA - WAIVED): 7.7 % — ABNORMAL HIGH (ref <7.0)

## 2016-03-07 MED ORDER — BENAZEPRIL HCL 10 MG PO TABS: 10.0000 mg | ORAL_TABLET | Freq: Every day | ORAL | 1 refills | Status: DC

## 2016-03-07 NOTE — Addendum Note (Signed)
Addended byGolden Pop on: 03/07/2016 02:53 PM   Modules accepted: Orders

## 2016-03-07 NOTE — Assessment & Plan Note (Signed)
The current medical regimen is effective;  continue present plan and medications.  

## 2016-03-07 NOTE — Assessment & Plan Note (Signed)
Discuss renal function essentially normal but declining and with no intervention would certainly become abnormal. Will start Benzapril 10 mg reviewed with patient. Recheck BMP 1 month no office visit needed doesn't need to be fasting.

## 2016-03-07 NOTE — Progress Notes (Signed)
BP 128/80 (BP Location: Left Arm, Patient Position: Sitting, Cuff Size: Large)   Pulse 66   Temp 97.4 F (36.3 C)   Wt 224 lb 6.4 oz (101.8 kg)   SpO2 97%   BMI 33.43 kg/m    Subjective:    [name], male    DOB: 01-Jul-1940, 75 y.o.   MRN: [id number]  HPI: Jack Crane is a 75 y.o. male  Chief Complaint  Patient presents with  . Diabetes   Patient follow-up medical problems doing well diabetes well controlled no low blood sugar spells or issues with medications. Gout no symptoms not taking medicines  Hypertension, hypercholesterol doing well no issues with medications taken faithfully. Relevant past medical, surgical, family and social history reviewed and updated as indicated. Interim medical history since our last visit reviewed. Allergies and medications reviewed and updated.  Review of Systems  Constitutional: Negative.   Respiratory: Negative.   Cardiovascular: Negative.     Per HPI unless specifically indicated above     Objective:    BP 128/80 (BP Location: Left Arm, Patient Position: Sitting, Cuff Size: Large)   Pulse 66   Temp 97.4 F (36.3 C)   Wt 224 lb 6.4 oz (101.8 kg)   SpO2 97%   BMI 33.43 kg/m   Wt Readings from Last 3 Encounters:  03/07/16 224 lb 6.4 oz (101.8 kg)  12/01/15 222 lb (100.7 kg)  08/24/15 222 lb (100.7 kg)    Physical Exam  Constitutional: He is oriented to person, place, and time. He appears well-developed and well-nourished. No distress.  HENT:  Head: Normocephalic and atraumatic.  Right Ear: Hearing normal.  Left Ear: Hearing normal.  Nose: Nose normal.  Eyes: Conjunctivae and lids are normal. Right eye exhibits no discharge. Left eye exhibits no discharge. No scleral icterus.  Cardiovascular: Normal rate, regular rhythm and normal heart sounds.   Pulmonary/Chest: Effort normal and breath sounds normal. No respiratory distress.  Musculoskeletal: Normal range of motion.  Neurological: He is alert and  oriented to person, place, and time.  Skin: Skin is intact. No rash noted.  Psychiatric: He has a normal mood and affect. His speech is normal and behavior is normal. Judgment and thought content normal. Cognition and memory are normal.    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 Will start Benzapril 10 mg reviewed with patient. Recheck BMP 1 month no office visit needed doesn't need to be fasting.      Hypertension    The current medical regimen is effective;  continue  present plan and medications.         Other   Gout    The current medical regimen is effective;  continue present plan and medications.       Pure hypercholesterolemia    The current medical regimen is effective;  continue present plan and medications.           Follow up plan: Return in about 3 months (around 06/07/2016) for Hemoglobin A1c, BMP,  Lipids, ALT, AST.

## 2016-03-26 DIAGNOSIS — E119 Type 2 diabetes mellitus without complications: Secondary | ICD-10-CM | POA: Diagnosis not present

## 2016-03-26 LAB — HM DIABETES EYE EXAM

## 2016-04-09 ENCOUNTER — Telehealth: Payer: Self-pay | Admitting: Family Medicine

## 2016-04-09 ENCOUNTER — Other Ambulatory Visit: Payer: Medicare PPO

## 2016-04-09 DIAGNOSIS — E118 Type 2 diabetes mellitus with unspecified complications: Secondary | ICD-10-CM

## 2016-04-09 NOTE — Telephone Encounter (Signed)
Pt came in for labs stated he never received a call or letter about his test results from his last visit. Please call pt ASAP to follow up. Thanks.

## 2016-04-09 NOTE — Telephone Encounter (Signed)
Called and left a message letting patient know that his labs went to his my chart account.

## 2016-04-09 NOTE — Telephone Encounter (Signed)
Pt would like a call back

## 2016-04-10 ENCOUNTER — Encounter: Payer: Self-pay | Admitting: Family Medicine

## 2016-04-10 LAB — BASIC METABOLIC PANEL
BUN / CREAT RATIO: 13 (ref 10–24)
BUN: 13 mg/dL (ref 8–27)
CO2: 28 mmol/L (ref 18–29)
CREATININE: 0.98 mg/dL (ref 0.76–1.27)
Calcium: 9.2 mg/dL (ref 8.6–10.2)
Chloride: 102 mmol/L (ref 96–106)
GFR calc Af Amer: 87 mL/min/{1.73_m2} (ref >59)
GFR calc non Af Amer: 75 mL/min/{1.73_m2} (ref >59)
GLUCOSE: 126 mg/dL — AB (ref 65–99)
Potassium: 4.9 mmol/L (ref 3.5–5.2)
Sodium: 143 mmol/L (ref 134–144)

## 2016-06-07 ENCOUNTER — Ambulatory Visit (INDEPENDENT_AMBULATORY_CARE_PROVIDER_SITE_OTHER): Payer: Medicare Other | Admitting: Family Medicine

## 2016-06-07 ENCOUNTER — Encounter: Payer: Self-pay | Admitting: Family Medicine

## 2016-06-07 VITALS — BP 115/70 | HR 74 | Ht 69.0 in | Wt 222.0 lb

## 2016-06-07 DIAGNOSIS — N181 Chronic kidney disease, stage 1: Secondary | ICD-10-CM | POA: Diagnosis not present

## 2016-06-07 DIAGNOSIS — I129 Hypertensive chronic kidney disease with stage 1 through stage 4 chronic kidney disease, or unspecified chronic kidney disease: Secondary | ICD-10-CM | POA: Diagnosis not present

## 2016-06-07 DIAGNOSIS — I10 Essential (primary) hypertension: Secondary | ICD-10-CM

## 2016-06-07 DIAGNOSIS — E7800 Pure hypercholesterolemia, unspecified: Secondary | ICD-10-CM | POA: Diagnosis not present

## 2016-06-07 DIAGNOSIS — E1122 Type 2 diabetes mellitus with diabetic chronic kidney disease: Secondary | ICD-10-CM | POA: Diagnosis not present

## 2016-06-07 DIAGNOSIS — Z23 Encounter for immunization: Secondary | ICD-10-CM | POA: Diagnosis not present

## 2016-06-07 NOTE — Assessment & Plan Note (Signed)
The current medical regimen is effective;  continue present plan and medications.  

## 2016-06-07 NOTE — Progress Notes (Signed)
BP 115/70   Pulse 74   Ht 5\' 9"  (1.753 m)   Wt 222 lb (100.7 kg)   SpO2 98%   BMI 32.78 kg/m    Subjective:    [name], male    DOB: 1941/01/19, 76 y.o.   MRN: [id number]  HPI: Jack Crane is a 76 y.o. male  Chief Complaint  Patient presents with  . Follow-up  . Diabetes  . Hypertension  Patient follow-up doing well with medications cholesterol no issues takes faithfully same with blood pressure and good control. Takes metformin for diabetes without problems or issues good control no low blood sugar spells.  Relevant past medical, surgical, family and social history reviewed and updated as indicated. Interim medical history since our last visit reviewed. Allergies and medications reviewed and updated.  Review of Systems  Constitutional: Negative.   Respiratory: Negative.   Cardiovascular: Negative.     Per HPI unless specifically indicated above     Objective:    BP 115/70   Pulse 74   Ht 5\' 9"  (1.753 m)   Wt 222 lb (100.7 kg)   SpO2 98%   BMI 32.78 kg/m   Wt Readings from Last 3 Encounters:  06/07/16 222 lb (100.7 kg)  03/07/16 224 lb 6.4 oz (101.8 kg)  12/01/15 222 lb (100.7 kg)    Physical Exam  Constitutional: He is oriented to person, place, and time. He appears well-developed and well-nourished. No distress.  HENT:  Head: Normocephalic and atraumatic.  Right Ear: Hearing normal.  Left Ear: Hearing normal.  Nose: Nose normal.  Eyes: Conjunctivae and lids are normal. Right eye exhibits no discharge. Left eye exhibits no discharge. No scleral icterus.  Cardiovascular: Normal rate, regular rhythm and normal heart sounds.   Pulmonary/Chest: Effort normal and breath sounds normal. No respiratory distress.  Musculoskeletal: Normal range of motion.  Neurological: He is alert and oriented to person, place, and time.  Skin: Skin is intact. No rash noted.  Psychiatric: He has a normal mood and affect. His speech is normal and behavior  is normal. Judgment and thought content normal. Cognition and memory are normal.    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       Relevant Orders   Basic metabolic panel   Bayer DCA Hb A1c Waived   LP+ALT+AST Piccolo, Waived   Hypertension    The current medical regimen is effective;  continue present plan and medications.       Relevant Orders   Basic metabolic panel   Bayer DCA Hb  A1c Waived   LP+ALT+AST Piccolo, [place]     Other   Pure hypercholesterolemia    The current medical regimen is effective;  continue present plan and medications.       Relevant Orders   Basic metabolic panel   Bayer DCA Hb A1c Waived   LP+ALT+AST Piccolo, Waived       Follow up plan: Return in about 3 months (around 09/04/2016) for Hemoglobin A1c.

## 2016-06-07 NOTE — Addendum Note (Signed)
Addended by: Gerda Diss A on: 06/07/2016 03:05 PM   Modules accepted: Orders

## 2016-06-08 LAB — LP+ALT+AST PICCOLO, WAIVED
ALT (SGPT) Piccolo, Waived: 20 U/L (ref 10–47)
AST (SGOT) PICCOLO, WAIVED: 31 U/L (ref 11–38)
Chol/HDL Ratio Piccolo,Waive: 3.6 mg/dL
Cholesterol Piccolo, Waived: 125 mg/dL (ref <200)
HDL CHOL PICCOLO, WAIVED: 35 mg/dL — AB (ref >59)
LDL Chol Calc Piccolo Waived: 70 mg/dL (ref <100)
TRIGLYCERIDES PICCOLO,WAIVED: 103 mg/dL (ref <150)
VLDL Chol Calc Piccolo,Waive: 21 mg/dL (ref <30)

## 2016-06-08 LAB — BASIC METABOLIC PANEL
BUN/Creatinine Ratio: 13 (ref 10–24)
BUN: 12 mg/dL (ref 8–27)
CALCIUM: 9.2 mg/dL (ref 8.6–10.2)
CHLORIDE: 103 mmol/L (ref 96–106)
CO2: 26 mmol/L (ref 18–29)
Creatinine, Ser: 0.94 mg/dL (ref 0.76–1.27)
GFR calc Af Amer: 91 mL/min/{1.73_m2} (ref >59)
GFR calc non Af Amer: 79 mL/min/{1.73_m2} (ref >59)
GLUCOSE: 99 mg/dL (ref 65–99)
Potassium: 5.3 mmol/L — ABNORMAL HIGH (ref 3.5–5.2)
Sodium: 146 mmol/L — ABNORMAL HIGH (ref 134–144)

## 2016-06-08 LAB — BAYER DCA HB A1C WAIVED: HB A1C (BAYER DCA - WAIVED): 6.7 % (ref <7.0)

## 2016-06-11 ENCOUNTER — Telehealth: Payer: Self-pay | Admitting: Family Medicine

## 2016-06-11 ENCOUNTER — Encounter: Payer: Self-pay | Admitting: Family Medicine

## 2016-06-11 NOTE — Telephone Encounter (Signed)
Printed and placed in out-going mail.

## 2016-06-11 NOTE — Telephone Encounter (Signed)
Patient would like a copy of his labs from last week mailed out to him  Thanks

## 2016-08-23 ENCOUNTER — Other Ambulatory Visit: Payer: Self-pay | Admitting: Family Medicine

## 2016-08-23 NOTE — Telephone Encounter (Signed)
Last OV: 06/07/16 Next OV: 09/10/16  BMP Latest Ref Rng & Units 06/07/2016 04/09/2016 12/06/2015  Glucose 65 - 99 mg/dL 99 126(H) 162(H)  BUN 8 - 27 mg/dL 12 13 13   Creatinine 0.76 - 1.27 mg/dL 0.94 0.98 1.06  BUN/Creat Ratio 10 - 24 13 13 12   Sodium 134 - 144 mmol/L 146(H) 143 143  Potassium 3.5 - 5.2 mmol/L 5.3(H) 4.9 4.2  Chloride 96 - 106 mmol/L 103 102 102  CO2 18 - 29 mmol/L 26 28 24   Calcium 8.6 - 10.2 mg/dL 9.2 9.2 8.7

## 2016-09-10 ENCOUNTER — Ambulatory Visit (INDEPENDENT_AMBULATORY_CARE_PROVIDER_SITE_OTHER): Payer: Medicare Other | Admitting: Family Medicine

## 2016-09-10 ENCOUNTER — Encounter: Payer: Self-pay | Admitting: Family Medicine

## 2016-09-10 VITALS — BP 128/80 | HR 59 | Wt 221.0 lb

## 2016-09-10 DIAGNOSIS — D692 Other nonthrombocytopenic purpura: Secondary | ICD-10-CM | POA: Diagnosis not present

## 2016-09-10 DIAGNOSIS — N181 Chronic kidney disease, stage 1: Secondary | ICD-10-CM

## 2016-09-10 DIAGNOSIS — I129 Hypertensive chronic kidney disease with stage 1 through stage 4 chronic kidney disease, or unspecified chronic kidney disease: Secondary | ICD-10-CM

## 2016-09-10 DIAGNOSIS — I10 Essential (primary) hypertension: Secondary | ICD-10-CM

## 2016-09-10 DIAGNOSIS — E1122 Type 2 diabetes mellitus with diabetic chronic kidney disease: Secondary | ICD-10-CM

## 2016-09-10 DIAGNOSIS — E7800 Pure hypercholesterolemia, unspecified: Secondary | ICD-10-CM

## 2016-09-10 LAB — BAYER DCA HB A1C WAIVED: HB A1C: 6.5 % (ref <7.0)

## 2016-09-10 NOTE — Assessment & Plan Note (Signed)
Mostly on arms and stable

## 2016-09-10 NOTE — Progress Notes (Signed)
BP 128/80   Pulse (!) 59   Wt 221 lb (100.2 kg)   SpO2 98%   BMI 32.64 kg/m    Subjective:    [name], male    DOB: [birth year], 76 y.o.   MRN: [id number]  HPI: Jack Crane is a 76 y.o. male  Chief Complaint  Patient presents with  . Follow-up   Patient follow-up diabetes noted low blood sugar spells or issues taking metformin without problems and good control. Blood pressure good control taking Benzapril without problems. Cholesterol no issues no complaints. Relevant past medical, surgical, family and social history reviewed and updated as indicated. Interim medical history since our last visit reviewed. Allergies and medications reviewed and updated.  Review of Systems  Constitutional: Negative.   Respiratory: Negative.   Cardiovascular: Negative.     Per HPI unless specifically indicated above     Objective:    BP 128/80   Pulse (!) 59   Wt 221 lb (100.2 kg)   SpO2 98%   BMI 32.64 kg/m   Wt Readings from Last 3 Encounters:  09/10/16 221 lb (100.2 kg)  06/07/16 222 lb (100.7 kg)  03/07/16 224 lb 6.4 oz (101.8 kg)    Physical Exam  Constitutional: He is oriented to person, place, and time. He appears well-developed and well-nourished.  HENT:  Head: Normocephalic and atraumatic.  Eyes: Conjunctivae and EOM are normal.  Neck: Normal range of motion.  Cardiovascular: Normal rate, regular rhythm and normal heart sounds.   Pulmonary/Chest: Effort normal and breath sounds normal.  Musculoskeletal: Normal range of motion.  Neurological: He is alert and oriented to person, place, and time.  Skin: No erythema.  Psychiatric: He has a normal mood and affect. His behavior is normal. Judgment and thought content normal.    Results for orders placed or performed in visit on 35/59/74  Basic metabolic panel  Result Value Ref Range   Glucose 99 65 - 99 mg/dL   BUN 12 8 - 27 mg/dL   Creatinine, Ser 0.94 0.76 - 1.27 mg/dL   GFR calc non Af  Amer 79 >59 mL/min/1.73   GFR calc Af Amer 91 >59 mL/min/1.73   BUN/Creatinine Ratio 13 10 - 24   Sodium 146 (H) 134 - 144 mmol/L   Potassium 5.3 (H) 3.5 - 5.2 mmol/L   Chloride 103 96 - 106 mmol/L   CO2 26 18 - 29 mmol/L   Calcium 9.2 8.6 - 10.2 mg/dL  Bayer DCA Hb A1c Waived  Result Value Ref Range   Bayer DCA Hb A1c Waived 6.7 <7.0 %  LP+ALT+AST Piccolo, Waived  Result Value Ref Range   ALT (SGPT) Piccolo, Waived 20 10 - 47 U/L   AST (SGOT) Piccolo, Waived 31 11 - 38 U/L   Cholesterol Piccolo, Waived 125 <200 mg/dL   HDL Chol Piccolo, Waived 35 (L) >59 mg/dL   Triglycerides Piccolo,Waived 103 <150 mg/dL   Chol/HDL Ratio Piccolo,Waive 3.6 mg/dL   LDL Chol Calc Piccolo Waived 70 <100 mg/dL   VLDL Chol Calc Piccolo,Waive 21 <30 mg/dL      Assessment & Plan:   Problem List Items Addressed This Visit      Cardiovascular and Mediastinum   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

## 2016-09-27 DIAGNOSIS — L719 Rosacea, unspecified: Secondary | ICD-10-CM | POA: Diagnosis not present

## 2016-09-27 DIAGNOSIS — D692 Other nonthrombocytopenic purpura: Secondary | ICD-10-CM | POA: Diagnosis not present

## 2016-09-27 DIAGNOSIS — L570 Actinic keratosis: Secondary | ICD-10-CM | POA: Diagnosis not present

## 2016-09-27 DIAGNOSIS — L578 Other skin changes due to chronic exposure to nonionizing radiation: Secondary | ICD-10-CM | POA: Diagnosis not present

## 2016-09-27 DIAGNOSIS — L821 Other seborrheic keratosis: Secondary | ICD-10-CM | POA: Diagnosis not present

## 2016-10-04 ENCOUNTER — Telehealth: Payer: Self-pay | Admitting: Family Medicine

## 2016-10-04 NOTE — Telephone Encounter (Signed)
Called pt to schedule Annual Wellness Visit with [facility]  - knb  °

## 2016-10-05 NOTE — Telephone Encounter (Signed)
Pt returned call did not lm, c/b and left direct # knb

## 2016-11-14 ENCOUNTER — Telehealth: Payer: Self-pay | Admitting: Family Medicine

## 2016-11-14 NOTE — Telephone Encounter (Signed)
Pt cb sched for 8/10

## 2016-11-18 ENCOUNTER — Other Ambulatory Visit: Payer: Self-pay | Admitting: Family Medicine

## 2016-11-19 NOTE — Telephone Encounter (Signed)
Last OV: 09/10/16 Next OV: 11/30/16  BMP Latest Ref Rng & Units 06/07/2016 04/09/2016 12/06/2015  Glucose 65 - 99 mg/dL 99 126(H) 162(H)  BUN 8 - 27 mg/dL 12 13 13   Creatinine 0.76 - 1.27 mg/dL 0.94 0.98 1.06  BUN/Creat Ratio 10 - 24 13 13 12   Sodium 134 - 144 mmol/L 146(H) 143 143  Potassium 3.5 - 5.2 mmol/L 5.3(H) 4.9 4.2  Chloride 96 - 106 mmol/L 103 102 102  CO2 18 - 29 mmol/L 26 28 24   Calcium 8.6 - 10.2 mg/dL 9.2 9.2 8.7

## 2016-11-30 ENCOUNTER — Ambulatory Visit (INDEPENDENT_AMBULATORY_CARE_PROVIDER_SITE_OTHER): Payer: Medicare Other

## 2016-11-30 VITALS — BP 108/71 | HR 89 | Temp 97.5°F | Resp 16 | Ht 69.0 in | Wt 208.5 lb

## 2016-11-30 DIAGNOSIS — Z0000 Encounter for general adult medical examination without abnormal findings: Secondary | ICD-10-CM

## 2016-11-30 NOTE — Patient Instructions (Signed)
[name] , Thank you for taking time to come for your Medicare Wellness Visit. I appreciate your ongoing commitment to your health goals. Please review the following plan we discussed and let me know if I can assist you in the future.   Screening recommendations/referrals: Colonoscopy: completed 10/16/2012, no longer required Recommended yearly ophthalmology/optometry visit for glaucoma screening and checkup Recommended yearly dental visit for hygiene and checkup  Vaccinations: Influenza vaccine: up to date Pneumococcal vaccine: up to date Tdap vaccine: up to date, due 01/2017 Shingles vaccine: up to date  Advanced directives: Advance directive discussed with you today. I have provided a copy for you to complete at home and have notarized. Once this is complete please bring a copy in to our office so we can scan it into your chart.  Conditions/risks identified: discussed tobacco cessation   Next appointment: Follow up on 12/12/2016 at 8:30am with Jack Crane. Follow up in one year for your annual wellness exam.   Preventive Care 65 Years and Older, Male Preventive care refers to lifestyle choices and visits with your health care provider that can promote health and wellness. What does preventive care include?  A yearly physical exam. This is also called an annual well check.  Dental exams once or twice a year.  Routine eye exams. Ask your health care provider how often you should have your eyes checked.  Personal lifestyle choices, including:  Daily care of your teeth and gums.  Regular physical activity.  Eating a healthy diet.  Avoiding tobacco and drug use.  Limiting alcohol use.  Practicing safe sex.  Taking low doses of aspirin every day.  Taking vitamin and mineral supplements as recommended by your health care provider. What happens during an annual well check? The services and screenings done by your health care provider during your annual well check will  depend on your age, overall health, lifestyle risk factors, and family history of disease. Counseling  Your health care provider may ask you questions about your:  Alcohol use.  Tobacco use.  Drug use.  Emotional well-being.  Home and relationship well-being.  Sexual activity.  Eating habits.  History of falls.  Memory and ability to understand (cognition).  Work and work Statistician. Screening  You may have the following tests or measurements:  Height, weight, and BMI.  Blood pressure.  Lipid and cholesterol levels. These may be checked every 5 years, or more frequently if you are over 49 years old.  Skin check.  Lung cancer screening. You may have this screening every year starting at age 64 if you have a 30-pack-year history of smoking and currently smoke or have quit within the past 15 years.  Fecal occult blood test (FOBT) of the stool. You may have this test every year starting at age 51.  Flexible sigmoidoscopy or colonoscopy. You may have a sigmoidoscopy every 5 years or a colonoscopy every 10 years starting at age 46.  Prostate cancer screening. Recommendations will vary depending on your family history and other risks.  Hepatitis C blood test.  Hepatitis B blood test.  Sexually transmitted disease (STD) testing.  Diabetes screening. This is done by checking your blood sugar (glucose) after you have not eaten for a while (fasting). You may have this done every 1-3 years.  Abdominal aortic aneurysm (AAA) screening. You may need this if you are a current or former smoker.  Osteoporosis. You may be screened starting at age 25 if you are at high risk. Talk with your health  care provider about your test results, treatment options, and if necessary, the need for more tests. Vaccines  Your health care provider may recommend certain vaccines, such as:  Influenza vaccine. This is recommended every year.  Tetanus, diphtheria, and acellular pertussis (Tdap,  Td) vaccine. You may need a Td booster every 10 years.  Zoster vaccine. You may need this after age 65.  Pneumococcal 13-valent conjugate (PCV13) vaccine. One dose is recommended after age 63.  Pneumococcal polysaccharide (PPSV23) vaccine. One dose is recommended after age 46. Talk to your health care provider about which screenings and vaccines you need and how often you need them. This information is not intended to replace advice given to you by your health care provider. Make sure you discuss any questions you have with your health care provider. Document Released: 05/06/2015 Document Revised: 12/28/2015 Document Reviewed: 02/08/2015 Elsevier Interactive Patient Education  2017 [place] Prevention in the Home Falls can cause injuries. They can happen to people of all ages. There are many things you can do to make your home safe and to help prevent falls. What can I do on the outside of my home?  Regularly fix the edges of walkways and driveways and fix any cracks.  Remove anything that might make you trip as you walk through a door, such as a raised step or threshold.  Trim any bushes or trees on the path to your home.  Use bright outdoor lighting.  Clear any walking paths of anything that might make someone trip, such as rocks or tools.  Regularly check to see if handrails are loose or broken. Make sure that both sides of any steps have handrails.  Any raised decks and porches should have guardrails on the edges.  Have any leaves, snow, or ice cleared regularly.  Use sand or salt on walking paths during winter.  Clean up any spills in your garage right away. This includes oil or grease spills. What can I do in the bathroom?  Use night lights.  Install grab bars by the toilet and in the tub and shower. Do not use towel bars as grab bars.  Use non-skid mats or decals in the tub or shower.  If you need to sit down in the shower, use a plastic, non-slip  stool.  Keep the floor dry. Clean up any water that spills on the floor as soon as it happens.  Remove soap buildup in the tub or shower regularly.  Attach bath mats securely with double-sided non-slip rug tape.  Do not have throw rugs and other things on the floor that can make you trip. What can I do in the bedroom?  Use night lights.  Make sure that you have a light by your bed that is easy to reach.  Do not use any sheets or blankets that are too big for your bed. They should not hang down onto the floor.  Have a firm chair that has side arms. You can use this for support while you get dressed.  Do not have throw rugs and other things on the floor that can make you trip. What can I do in the kitchen?  Clean up any spills right away.  Avoid walking on wet floors.  Keep items that you use a lot in easy-to-reach places.  If you need to reach something above you, use a strong step stool that has a grab bar.  Keep electrical cords out of the way.  Do not use floor  polish or wax that makes floors slippery. If you must use wax, use non-skid floor wax.  Do not have throw rugs and other things on the floor that can make you trip. What can I do with my stairs?  Do not leave any items on the stairs.  Make sure that there are handrails on both sides of the stairs and use them. Fix handrails that are broken or loose. Make sure that handrails are as long as the stairways.  Check any carpeting to make sure that it is firmly attached to the stairs. Fix any carpet that is loose or worn.  Avoid having throw rugs at the top or bottom of the stairs. If you do have throw rugs, attach them to the floor with carpet tape.  Make sure that you have a light switch at the top of the stairs and the bottom of the stairs. If you do not have them, ask someone to add them for you. What else can I do to help prevent falls?  Wear shoes that:  Do not have high heels.  Have rubber bottoms.  Are  comfortable and fit you well.  Are closed at the toe. Do not wear sandals.  If you use a stepladder:  Make sure that it is fully opened. Do not climb a closed stepladder.  Make sure that both sides of the stepladder are locked into place.  Ask someone to hold it for you, if possible.  Clearly mark and make sure that you can see:  Any grab bars or handrails.  First and last steps.  Where the edge of each step is.  Use tools that help you move around (mobility aids) if they are needed. These include:  Canes.  Walkers.  Scooters.  Crutches.  Turn on the lights when you go into a dark area. Replace any light bulbs as soon as they burn out.  Set up your furniture so you have a clear path. Avoid moving your furniture around.  If any of your floors are uneven, fix them.  If there are any pets around you, be aware of where they are.  Review your medicines with your doctor. Some medicines can make you feel dizzy. This can increase your chance of falling. Ask your doctor what other things that you can do to help prevent falls. This information is not intended to replace advice given to you by your health care provider. Make sure you discuss any questions you have with your health care provider. Document Released: 02/03/2009 Document Revised: 09/15/2015 Document Reviewed: 05/14/2014 Elsevier Interactive Patient Education  2017 [employer].

## 2016-11-30 NOTE — Progress Notes (Signed)
Subjective:   Jack Crane is a 76 y.o. male who presents for Medicare Annual/Subsequent preventive examination.  Review of Systems:   Cardiac Risk Factors include: male gender;advanced age (>23men, >79 women);hypertension;diabetes mellitus;obesity (BMI >30kg/m2);dyslipidemia     Objective:    Vitals: BP 108/71 (BP Location: Left Arm, Patient Position: Sitting)   Pulse 89   Temp (!) 97.5 F (36.4 C)   Resp 16   Ht 5\' 9"  (1.753 m)   Wt 208 lb 8 oz (94.6 kg)   BMI 30.79 kg/m   Body mass index is 30.79 kg/m.  Tobacco History  Smoking Status  . Former Smoker  . Types: Cigarettes  . Quit date: 11/02/1974  Smokeless Tobacco  . Current User  . Types: Chew     Ready to quit: Not Answered Counseling given: Not Answered   Past Medical History:  Diagnosis Date  . Chronic kidney disease   . Diabetes mellitus without complication ([place])   . Gout   . Hypertension    Past Surgical History:  Procedure Laterality Date  . APPENDECTOMY    . stomach abcess     Family History  Problem Relation Age of Onset  . Diabetes Mother   . Cancer Sister        lung  . Diabetes Brother   . Cancer Brother        lung  . Diabetes Brother   . Diabetes Brother    History  Sexual Activity  . Sexual activity: Not on file    Outpatient Encounter Prescriptions as of 11/30/2016  Medication Sig  . aspirin 81 MG tablet Take 81 mg by mouth daily.  Marland Kitchen atorvastatin (LIPITOR) 40 MG tablet Take 1 tablet (40 mg total) by mouth daily.  . benazepril (LOTENSIN) 10 MG tablet TAKE 1 TABLET BY MOUTH EVERY DAY  . glucose blood test strip 1 each by Other route as needed for other. Use as instructed  . metFORMIN (GLUCOPHAGE) 500 MG tablet Take 1 tablet (500 mg total) by mouth 2 (two) times daily with a meal. 1st week take 1 in PM then BID   No facility-administered encounter medications on file as of 11/30/2016.     Activities of Daily Living In your present state of health, do you have any  difficulty performing the following activities: 11/30/2016 12/01/2015  Hearing? N N  Vision? N N  Difficulty concentrating or making decisions? Y N  Walking or climbing stairs? N N  Dressing or bathing? N N  Doing errands, shopping? N N  Preparing Food and eating ? N -  Using the Toilet? N -  In the past six months, have you accidently leaked urine? N -  Do you have problems with loss of bowel control? N -  Managing your Medications? N -  Managing your Finances? N -  Housekeeping or managing your Housekeeping? N -  Some recent data might be hidden    Patient Care Team: Guadalupe Maple, MD as PCP - General (Family Medicine) Ralene Bathe, MD (Dermatology) Leandrew Koyanagi, MD as Referring Physician (Ophthalmology)   Assessment:     Exercise Activities and Dietary recommendations Current Exercise Habits: The patient does not participate in regular exercise at present, Exercise limited by: None identified  Goals    . Quit smoking / using tobacco      Fall Risk Fall Risk  11/30/2016 09/10/2016 06/07/2016 12/01/2015 08/24/2015  Falls in the past year? No No No No No   Depression Screen  PHQ 2/9 Scores 11/30/2016 09/10/2016 06/07/2016 12/01/2015  PHQ - 2 Score 0 0 0 0    Cognitive Function     6CIT Screen 11/30/2016  What Year? 0 points  What month? 0 points  What time? 0 points  Count back from 20 0 points  Months in reverse 0 points  Repeat phrase 2 points  Total Score 2    Immunization History  Administered Date(s) Administered  . Influenza,inj,Quad PF,36+ Mos 06/07/2016  . Pneumococcal Conjugate-13 10/26/2013  . Pneumococcal Polysaccharide-23 01/16/2007  . Td 02/13/2007  . Zoster 06/16/2009   Screening Tests Health Maintenance  Topic Date Due  . FOOT EXAM  05/25/2016  . INFLUENZA VACCINE  11/21/2016  . TETANUS/TDAP  02/12/2017  . HEMOGLOBIN A1C  03/13/2017  . OPHTHALMOLOGY EXAM  03/26/2017  . COLONOSCOPY  10/17/2022  . PNA vac Low Risk Adult  Completed        Plan:     I have personally reviewed and addressed the Medicare Annual Wellness questionnaire and have noted the following in the patient's chart:  A. Medical and social history B. Use of alcohol, tobacco or illicit drugs  C. Current medications and supplements D. Functional ability and status E.  Nutritional status F.  Physical activity G. Advance directives H. List of other physicians I.  Hospitalizations, surgeries, and ER visits in previous 12 months J.  [place] such as hearing and vision if needed, cognitive and depression L. Referrals and appointments   In addition, I have reviewed and discussed with patient certain preventive protocols, quality metrics, and best practice recommendations. A written personalized care plan for preventive services as well as general preventive health recommendations were provided to patient.   Signed,  Tyler Aas, LPN Nurse Health Advisor   MD Recommendations: due for diabetic foot exam at next visit.

## 2016-12-12 ENCOUNTER — Encounter: Payer: Self-pay | Admitting: Family Medicine

## 2016-12-12 ENCOUNTER — Ambulatory Visit (INDEPENDENT_AMBULATORY_CARE_PROVIDER_SITE_OTHER): Payer: Medicare Other | Admitting: Family Medicine

## 2016-12-12 VITALS — BP 103/69 | HR 66 | Wt 211.0 lb

## 2016-12-12 DIAGNOSIS — D692 Other nonthrombocytopenic purpura: Secondary | ICD-10-CM | POA: Diagnosis not present

## 2016-12-12 DIAGNOSIS — E7800 Pure hypercholesterolemia, unspecified: Secondary | ICD-10-CM

## 2016-12-12 DIAGNOSIS — I10 Essential (primary) hypertension: Secondary | ICD-10-CM

## 2016-12-12 DIAGNOSIS — M1000 Idiopathic gout, unspecified site: Secondary | ICD-10-CM

## 2016-12-12 DIAGNOSIS — E119 Type 2 diabetes mellitus without complications: Secondary | ICD-10-CM | POA: Diagnosis not present

## 2016-12-12 DIAGNOSIS — K4020 Bilateral inguinal hernia, without obstruction or gangrene, not specified as recurrent: Secondary | ICD-10-CM

## 2016-12-12 DIAGNOSIS — N181 Chronic kidney disease, stage 1: Secondary | ICD-10-CM

## 2016-12-12 DIAGNOSIS — Z7189 Other specified counseling: Secondary | ICD-10-CM | POA: Diagnosis not present

## 2016-12-12 DIAGNOSIS — Z0000 Encounter for general adult medical examination without abnormal findings: Secondary | ICD-10-CM | POA: Diagnosis not present

## 2016-12-12 DIAGNOSIS — Z1329 Encounter for screening for other suspected endocrine disorder: Secondary | ICD-10-CM

## 2016-12-12 DIAGNOSIS — I129 Hypertensive chronic kidney disease with stage 1 through stage 4 chronic kidney disease, or unspecified chronic kidney disease: Secondary | ICD-10-CM

## 2016-12-12 DIAGNOSIS — K4090 Unilateral inguinal hernia, without obstruction or gangrene, not specified as recurrent: Secondary | ICD-10-CM | POA: Insufficient documentation

## 2016-12-12 DIAGNOSIS — N400 Enlarged prostate without lower urinary tract symptoms: Secondary | ICD-10-CM

## 2016-12-12 DIAGNOSIS — E1122 Type 2 diabetes mellitus with diabetic chronic kidney disease: Secondary | ICD-10-CM

## 2016-12-12 DIAGNOSIS — Z125 Encounter for screening for malignant neoplasm of prostate: Secondary | ICD-10-CM | POA: Diagnosis not present

## 2016-12-12 LAB — MICROSCOPIC EXAMINATION
Bacteria, UA: NONE SEEN
RBC MICROSCOPIC, UA: NONE SEEN /HPF (ref 0–?)

## 2016-12-12 LAB — URINALYSIS, ROUTINE W REFLEX MICROSCOPIC
Bilirubin, UA: NEGATIVE
GLUCOSE, UA: NEGATIVE
Ketones, UA: NEGATIVE
Leukocytes, UA: NEGATIVE
NITRITE UA: NEGATIVE
RBC UA: NEGATIVE
SPEC GRAV UA: 1.025 (ref 1.005–1.030)
UUROB: 0.2 mg/dL (ref 0.2–1.0)
pH, UA: 5 (ref 5.0–7.5)

## 2016-12-12 LAB — BAYER DCA HB A1C WAIVED: HB A1C: 6.4 % (ref <7.0)

## 2016-12-12 MED ORDER — BENAZEPRIL HCL 10 MG PO TABS: 10.0000 mg | ORAL_TABLET | Freq: Every day | ORAL | 4 refills | Status: DC

## 2016-12-12 MED ORDER — METFORMIN HCL 500 MG PO TABS: 500.0000 mg | ORAL_TABLET | Freq: Two times a day (BID) | ORAL | 4 refills | Status: DC

## 2016-12-12 MED ORDER — ATORVASTATIN CALCIUM 40 MG PO TABS: 40.0000 mg | ORAL_TABLET | Freq: Every day | ORAL | 4 refills | Status: DC

## 2016-12-12 NOTE — Assessment & Plan Note (Signed)
No tx needed

## 2016-12-12 NOTE — Progress Notes (Signed)
BP 103/69   Pulse 66   Wt 211 lb (95.7 kg)   SpO2 98%   BMI 31.16 kg/m    Subjective:    [name], male    DOB: [birth year], 76 y.o.   MRN: [id number]  HPI: Jack Crane is a 76 y.o. male  Annual Exam  Patient all in all doing well from diabetes noted low blood sugar spells taking medicines without problems on glucose monitoring in the low 100s Blood pressure cluster all also doing well with medications no real issues taking medicines faithfully Patient also has some occasional cramps in hands and feet but they come and go without much problems. Relevant past medical, surgical, family and social history reviewed and updated as indicated. Interim medical history since our last visit reviewed. Allergies and medications reviewed and updated.  Review of Systems  Constitutional: Negative.   HENT: Negative.   Eyes: Negative.   Respiratory: Negative.   Cardiovascular: Negative.   Gastrointestinal: Negative.   Endocrine: Negative.   Genitourinary: Negative.   Musculoskeletal: Negative.   Skin: Negative.   Allergic/Immunologic: Negative.   Neurological: Negative.   Hematological: Negative.   Psychiatric/Behavioral: Negative.     Per HPI unless specifically indicated above     Objective:    BP 103/69   Pulse 66   Wt 211 lb (95.7 kg)   SpO2 98%   BMI 31.16 kg/m   Wt Readings from Last 3 Encounters:  12/12/16 211 lb (95.7 kg)  11/30/16 208 lb 8 oz (94.6 kg)  09/10/16 221 lb (100.2 kg)    Physical Exam  Constitutional: He is oriented to person, place, and time. He appears well-developed and well-nourished.  HENT:  Head: Normocephalic and atraumatic.  Right Ear: External ear normal.  Left Ear: External ear normal.  Eyes: Pupils are equal, round, and reactive to light. Conjunctivae and EOM are normal.  Neck: Normal range of motion. Neck supple.  Cardiovascular: Normal rate, regular rhythm, normal heart sounds and intact distal pulses.     Pulmonary/Chest: Effort normal and breath sounds normal.  Abdominal: Soft. Bowel sounds are normal. There is no splenomegaly or hepatomegaly.  Genitourinary: Rectum normal and penis normal.  Genitourinary Comments: BPH changes Bilateral large hernias  Musculoskeletal: Normal range of motion.  Neurological: He is alert and oriented to person, place, and time. He has normal reflexes.  Skin: No rash noted. No erythema.  Psychiatric: He has a normal mood and affect. His behavior is normal. Judgment and thought content normal.    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       Relevant Medications   atorvastatin (LIPITOR) 40 MG tablet   benazepril (LOTENSIN) 10 MG tablet    Other Relevant Orders   CBC with Differential/Platelet   Lipid panel   Urinalysis, Routine w reflex microscopic   Bayer DCA Hb A1c Waived   Advanced care planning/counseling discussion    A voluntary discussion about advance care planning including the explanation and discussion of advance directives was extensively discussed  with the patient.  Explanation about the health care proxy and Living will was reviewed and packet with forms with explanation of how to fill them out was given. Will bring in paper work.       Inguinal hernia    Stable not presenting any problem to patient       Other Visit Diagnoses    Annual physical exam       Prostate cancer screening       Thyroid disorder screen       Relevant Orders   TSH   Type 2 diabetes mellitus without complication, without long-term current use of insulin ([facility])       Relevant Medications   atorvastatin (LIPITOR) 40 MG tablet   benazepril (LOTENSIN) 10 MG tablet   metFORMIN (GLUCOPHAGE) 500 MG tablet      Discussed and patient wants to have PSA testing done  Follow up plan: Return in about 6 months (around 06/14/2017) for Hemoglobin A1c, BMP,  Lipids, ALT, AST.

## 2016-12-12 NOTE — Assessment & Plan Note (Signed)
The current medical regimen is effective;  continue present plan and medications.  

## 2016-12-12 NOTE — Assessment & Plan Note (Signed)
A voluntary discussion about advance care planning including the explanation and discussion of advance directives was extensively discussed  with the patient.  Explanation about the health care proxy and Living will was reviewed and packet with forms with explanation of how to fill them out was given. Will bring in paper work.

## 2016-12-12 NOTE — Assessment & Plan Note (Signed)
No meds no sx

## 2016-12-12 NOTE — Assessment & Plan Note (Signed)
Stable not presenting any problem to patient

## 2016-12-12 NOTE — Assessment & Plan Note (Signed)
Extensive spots on arms but otherwise stable

## 2016-12-13 ENCOUNTER — Encounter: Payer: Self-pay | Admitting: Family Medicine

## 2016-12-13 ENCOUNTER — Ambulatory Visit: Payer: Self-pay

## 2016-12-13 LAB — CBC WITH DIFFERENTIAL/PLATELET
BASOS ABS: 0 10*3/uL (ref 0.0–0.2)
Basos: 0 %
EOS (ABSOLUTE): 0.2 10*3/uL (ref 0.0–0.4)
Eos: 2 %
HEMOGLOBIN: 13.7 g/dL (ref 13.0–17.7)
Hematocrit: 39.4 % (ref 37.5–51.0)
Immature Grans (Abs): 0 10*3/uL (ref 0.0–0.1)
Immature Granulocytes: 0 %
LYMPHS ABS: 1.4 10*3/uL (ref 0.7–3.1)
Lymphs: 19 %
MCH: 32 pg (ref 26.6–33.0)
MCHC: 34.8 g/dL (ref 31.5–35.7)
MCV: 92 fL (ref 79–97)
MONOS ABS: 0.8 10*3/uL (ref 0.1–0.9)
Monocytes: 10 %
NEUTROS ABS: 5.3 10*3/uL (ref 1.4–7.0)
Neutrophils: 69 %
PLATELETS: 225 10*3/uL (ref 150–379)
RBC: 4.28 x10E6/uL (ref 4.14–5.80)
RDW: 13.2 % (ref 12.3–15.4)
WBC: 7.7 10*3/uL (ref 3.4–10.8)

## 2016-12-13 LAB — COMPREHENSIVE METABOLIC PANEL
A/G RATIO: 1.5 (ref 1.2–2.2)
ALK PHOS: 72 IU/L (ref 39–117)
ALT: 13 IU/L (ref 0–44)
AST: 22 IU/L (ref 0–40)
Albumin: 4.1 g/dL (ref 3.5–4.8)
BUN / CREAT RATIO: 16 (ref 10–24)
BUN: 19 mg/dL (ref 8–27)
Bilirubin Total: 0.7 mg/dL (ref 0.0–1.2)
CALCIUM: 9 mg/dL (ref 8.6–10.2)
CHLORIDE: 102 mmol/L (ref 96–106)
CO2: 23 mmol/L (ref 20–29)
Creatinine, Ser: 1.16 mg/dL (ref 0.76–1.27)
GFR calc non Af Amer: 61 mL/min/{1.73_m2} (ref >59)
GFR, EST AFRICAN AMERICAN: 71 mL/min/{1.73_m2} (ref >59)
GLOBULIN, TOTAL: 2.8 g/dL (ref 1.5–4.5)
Glucose: 109 mg/dL — ABNORMAL HIGH (ref 65–99)
POTASSIUM: 4.8 mmol/L (ref 3.5–5.2)
SODIUM: 140 mmol/L (ref 134–144)
Total Protein: 6.9 g/dL (ref 6.0–8.5)

## 2016-12-13 LAB — TSH: TSH: 3.04 u[IU]/mL (ref 0.450–4.500)

## 2016-12-13 LAB — LIPID PANEL
CHOLESTEROL TOTAL: 115 mg/dL (ref 100–199)
Chol/HDL Ratio: 3.3 ratio (ref 0.0–5.0)
HDL: 35 mg/dL — ABNORMAL LOW (ref >39)
LDL CALC: 66 mg/dL (ref 0–99)
Triglycerides: 70 mg/dL (ref 0–149)
VLDL Cholesterol Cal: 14 mg/dL (ref 5–40)

## 2016-12-13 LAB — PSA: PROSTATE SPECIFIC AG, SERUM: 3 ng/mL (ref 0.0–4.0)

## 2017-01-08 ENCOUNTER — Other Ambulatory Visit: Payer: Self-pay | Admitting: Family Medicine

## 2017-01-08 DIAGNOSIS — E119 Type 2 diabetes mellitus without complications: Secondary | ICD-10-CM

## 2017-02-13 ENCOUNTER — Other Ambulatory Visit: Payer: Self-pay | Admitting: Family Medicine

## 2017-02-14 ENCOUNTER — Other Ambulatory Visit: Payer: Self-pay | Admitting: Family Medicine

## 2017-02-14 DIAGNOSIS — E7800 Pure hypercholesterolemia, unspecified: Secondary | ICD-10-CM

## 2017-02-22 ENCOUNTER — Other Ambulatory Visit: Payer: Self-pay | Admitting: Family Medicine

## 2017-03-25 ENCOUNTER — Ambulatory Visit (INDEPENDENT_AMBULATORY_CARE_PROVIDER_SITE_OTHER): Payer: Medicare Other

## 2017-03-25 DIAGNOSIS — Z23 Encounter for immunization: Secondary | ICD-10-CM | POA: Diagnosis not present

## 2017-05-08 LAB — HM DIABETES EYE EXAM

## 2017-05-14 ENCOUNTER — Other Ambulatory Visit: Payer: Self-pay | Admitting: Family Medicine

## 2017-06-11 ENCOUNTER — Ambulatory Visit: Payer: Medicare Other | Admitting: Family Medicine

## 2017-06-11 ENCOUNTER — Encounter: Payer: Self-pay | Admitting: Family Medicine

## 2017-06-11 VITALS — BP 125/80 | HR 62 | Wt 225.0 lb

## 2017-06-11 DIAGNOSIS — E7800 Pure hypercholesterolemia, unspecified: Secondary | ICD-10-CM | POA: Diagnosis not present

## 2017-06-11 DIAGNOSIS — N181 Chronic kidney disease, stage 1: Secondary | ICD-10-CM | POA: Diagnosis not present

## 2017-06-11 DIAGNOSIS — E1122 Type 2 diabetes mellitus with diabetic chronic kidney disease: Secondary | ICD-10-CM | POA: Diagnosis not present

## 2017-06-11 DIAGNOSIS — I129 Hypertensive chronic kidney disease with stage 1 through stage 4 chronic kidney disease, or unspecified chronic kidney disease: Secondary | ICD-10-CM | POA: Diagnosis not present

## 2017-06-11 DIAGNOSIS — S61402A Unspecified open wound of left hand, initial encounter: Secondary | ICD-10-CM | POA: Diagnosis not present

## 2017-06-11 DIAGNOSIS — D692 Other nonthrombocytopenic purpura: Secondary | ICD-10-CM

## 2017-06-11 DIAGNOSIS — Z23 Encounter for immunization: Secondary | ICD-10-CM | POA: Diagnosis not present

## 2017-06-11 DIAGNOSIS — I10 Essential (primary) hypertension: Secondary | ICD-10-CM | POA: Diagnosis not present

## 2017-06-11 DIAGNOSIS — M1000 Idiopathic gout, unspecified site: Secondary | ICD-10-CM

## 2017-06-11 LAB — BAYER DCA HB A1C WAIVED: HB A1C: 6.4 % (ref <7.0)

## 2017-06-11 LAB — LP+ALT+AST PICCOLO, WAIVED
ALT (SGPT) PICCOLO, WAIVED: 21 U/L (ref 10–47)
AST (SGOT) Piccolo, Waived: 23 U/L (ref 11–38)
Chol/HDL Ratio Piccolo,Waive: 3.3 mg/dL
Cholesterol Piccolo, Waived: 137 mg/dL (ref <200)
HDL Chol Piccolo, Waived: 41 mg/dL — ABNORMAL LOW (ref >59)
LDL CHOL CALC PICCOLO WAIVED: 80 mg/dL (ref <100)
Triglycerides Piccolo,Waived: 80 mg/dL (ref <150)
VLDL CHOL CALC PICCOLO,WAIVE: 16 mg/dL (ref <30)

## 2017-06-11 NOTE — Assessment & Plan Note (Signed)
stable °

## 2017-06-11 NOTE — Progress Notes (Signed)
   BP 125/80   Pulse 62   Wt 225 lb (102.1 kg)   SpO2 98%   BMI 33.23 kg/m    Subjective:    [name], male    DOB: May 09, 1940, 77 y.o.   MRN: [id number]  HPI: Jack Crane is a 77 y.o. male  Chief Complaint  Patient presents with  . Follow-up  . Diabetes  . Hypertension  . Hyperlipidemia  Wound lt hand and rt hand healing well will give Td. Patient all in all doing well is been to dermatology had multiple skin cryo sessions. Diabetes doing well no low blood sugar spells or issues medications. Blood pressure cholesterol also well doing well taking medications faithfully with no issues.   Relevant past medical, surgical, family and social history reviewed and updated as indicated. Interim medical history since our last visit reviewed. Allergies and medications reviewed and updated.  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       Relevant Orders   Bayer DCA Hb A1c Waived   Basic metabolic panel   LP+ALT+AST Piccolo, Waived   Hypertension    The current medical regimen is effective;  continue present plan and medications.       Relevant Orders   Bayer DCA Hb A1c Waived   Basic metabolic panel   LP+ALT+AST Piccolo, Waived   [name] ([place])    stable        Other   Gout    The current medical regimen is effective;  continue present plan and medications.       Pure hypercholesterolemia    The current medical regimen is effective;  continue present plan and medications.       Relevant Orders   Bayer DCA Hb A1c Waived   Basic metabolic panel   LP+ALT+AST Piccolo, [place]    Other Visit Diagnoses    Need for Tdap vaccination       Relevant Orders   Td : Tetanus/diphtheria >7yo Preservative  free (Completed)   Open wound of left hand without foreign body, unspecified wound type, initial encounter       Dt given healing well       Follow up plan: Return in about 6 months (around 12/09/2017) for Physical Exam, Hemoglobin A1c, uric acid.

## 2017-06-11 NOTE — Assessment & Plan Note (Signed)
The current medical regimen is effective;  continue present plan and medications.  

## 2017-06-12 ENCOUNTER — Encounter: Payer: Self-pay | Admitting: Family Medicine

## 2017-06-12 LAB — BASIC METABOLIC PANEL
BUN / CREAT RATIO: 14 (ref 10–24)
BUN: 13 mg/dL (ref 8–27)
CALCIUM: 9 mg/dL (ref 8.6–10.2)
CO2: 24 mmol/L (ref 20–29)
CREATININE: 0.96 mg/dL (ref 0.76–1.27)
Chloride: 103 mmol/L (ref 96–106)
GFR calc Af Amer: 88 mL/min/{1.73_m2} (ref >59)
GFR calc non Af Amer: 76 mL/min/{1.73_m2} (ref >59)
GLUCOSE: 147 mg/dL — AB (ref 65–99)
Potassium: 4.2 mmol/L (ref 3.5–5.2)
Sodium: 143 mmol/L (ref 134–144)

## 2017-06-18 ENCOUNTER — Ambulatory Visit: Payer: Medicare Other | Admitting: Family Medicine

## 2017-08-06 ENCOUNTER — Other Ambulatory Visit: Payer: Self-pay | Admitting: Family Medicine

## 2017-09-22 ENCOUNTER — Other Ambulatory Visit: Payer: Self-pay | Admitting: Family Medicine

## 2017-09-22 DIAGNOSIS — E119 Type 2 diabetes mellitus without complications: Secondary | ICD-10-CM

## 2017-09-24 NOTE — Telephone Encounter (Signed)
Metformin refill Last Refill:12/12/16  # 180 4 RF Last OV: 06/11/17 PCP: Dr. Jeananne Rama Pharmacy:[address]  HgbA1C: 06/11/17 = 6.4

## 2017-10-15 ENCOUNTER — Encounter: Payer: Self-pay | Admitting: Family Medicine

## 2017-10-29 ENCOUNTER — Other Ambulatory Visit: Payer: Self-pay | Admitting: Unknown Physician Specialty

## 2017-10-30 NOTE — Telephone Encounter (Signed)
Benazepril refill Last Refill:07/27/17 # 90 Last OV: 06/11/17 PCP: Dr Jeananne Rama Pharmacy:[facility] [address]

## 2017-12-06 ENCOUNTER — Ambulatory Visit (INDEPENDENT_AMBULATORY_CARE_PROVIDER_SITE_OTHER): Payer: Medicare Other

## 2017-12-06 VITALS — BP 118/60 | HR 68 | Temp 98.8°F | Resp 16 | Ht 69.0 in | Wt 213.6 lb

## 2017-12-06 DIAGNOSIS — Z0000 Encounter for general adult medical examination without abnormal findings: Secondary | ICD-10-CM

## 2017-12-06 NOTE — Progress Notes (Signed)
Subjective:   Jack Crane is a 77 y.o. male who presents for Medicare Annual/Subsequent preventive examination.  Review of Systems:   Cardiac Risk Factors include: advanced age (>69men, >76 women);dyslipidemia;diabetes mellitus;hypertension;obesity (BMI >30kg/m2)     Objective:    Vitals: BP 118/60 (BP Location: Left Arm, Patient Position: Sitting)   Pulse 68   Temp 98.8 F (37.1 C) (Temporal)   Resp 16   Ht 5\' 9"  (1.753 m)   Wt 213 lb 9.6 oz (96.9 kg)   BMI 31.54 kg/m   Body mass index is 31.54 kg/m.  Advanced Directives 12/06/2017 11/30/2016  Does Patient Have a Medical Advance Directive? Yes No  Type of Advance Directive Living will;Healthcare Power of Attorney -  [place] [facility] in Chart? No - copy requested -  Would patient like information on creating a medical advance directive? - Yes (MAU/Ambulatory/Procedural Areas - Information given)    Tobacco Social History   Tobacco Use  Smoking Status Former Smoker  . Types: Cigarettes  . Last attempt to quit: 11/02/1974  . Years since quitting: 43.1  Smokeless Tobacco Current User  . Types: Chew     Ready to quit: No Counseling given: Yes   Clinical Intake:  Pre-visit preparation completed: Yes  Pain : No/denies pain     Nutritional Status: BMI > 30  Obese Nutritional Risks: None Diabetes: Yes CBG done?: No Did pt. bring in CBG monitor from home?: No  How often do you need to have someone help you when you read instructions, pamphlets, or other written materials from your doctor or pharmacy?: 1 - Never What is the last grade level you completed in school?: 9th grade  Interpreter Needed?: No  Information entered by :: Tiffany Hill,LPN   Past Medical History:  Diagnosis Date  . Chronic kidney disease   . Diabetes mellitus without complication ([place])   . Gout   . Hypertension    Past Surgical History:  Procedure Laterality Date  . APPENDECTOMY    . stomach abcess      Family History  Problem Relation Age of Onset  . Diabetes Mother   . Cancer Sister        lung  . Diabetes Brother   . Cancer Brother        lung  . Diabetes Brother   . Diabetes Brother    Social History   Socioeconomic History  . Marital status: Single    Spouse name: Not on file  . Number of children: Not on file  . Years of education: Not on file  . Highest education level: 9th grade  Occupational History  . Not on file  Social Needs  . Financial resource strain: Not hard at all  . Food insecurity:    Worry: Never true    Inability: Never true  . Transportation needs:    Medical: No    Non-medical: No  Tobacco Use  . Smoking status: Former Smoker    Types: Cigarettes    Last attempt to quit: 11/02/1974    Years since quitting: 43.1  . Smokeless tobacco: Current User    Types: Chew  Substance and Sexual Activity  . Alcohol use: No    Alcohol/week: 0.0 standard drinks  . Drug use: No  . Sexual activity: Not on file  Lifestyle  . Physical activity:    Days per week: 0 days    Minutes per session: 0 min  . Stress: Not at all  Relationships  . Social connections:    Talks on phone: Once a week    Gets together: More than three times a week    Attends religious service: Never    Active member of club or organization: No    Attends meetings of clubs or organizations: Never    Relationship status: Never married  Other Topics Concern  . Not on file  Social History Narrative  . Not on file    Outpatient Encounter Medications as of 12/06/2017  Medication Sig  . aspirin 81 MG tablet Take 81 mg by mouth daily.  Marland Kitchen atorvastatin (LIPITOR) 40 MG tablet Take 1 tablet (40 mg total) by mouth daily.  . benazepril (LOTENSIN) 10 MG tablet Take 1 tablet (10 mg total) by mouth daily.  Marland Kitchen glucose blood test strip 1 each by Other route as needed for other. Use as instructed  . metFORMIN (GLUCOPHAGE) 500 MG tablet Take 1 tablet (500 mg total) by mouth 2 (two) times daily  with a meal. 1st week take 1 in PM then BID  . benazepril (LOTENSIN) 10 MG tablet TAKE 1 TABLET BY MOUTH EVERY DAY (Patient not taking: Reported on 12/06/2017)  . metFORMIN (GLUCOPHAGE) 500 MG tablet TAKE 1 TABLET BY MOUTH EVERY EVENING FOR 1 WEEK THEN INCREASE TO 2 TIMES A DAY WITH MEALS (Patient not taking: Reported on 12/06/2017)   No facility-administered encounter medications on file as of 12/06/2017.     Activities of Daily Living In your present state of health, do you have any difficulty performing the following activities: 12/06/2017  Hearing? N  Vision? N  Difficulty concentrating or making decisions? N  Walking or climbing stairs? N  Dressing or bathing? N  Doing errands, shopping? N  Preparing Food and eating ? N  Using the Toilet? N  In the past six months, have you accidently leaked urine? N  Do you have problems with loss of bowel control? N  Managing your Medications? N  Managing your Finances? N  Housekeeping or managing your Housekeeping? N  Some recent data might be hidden    Patient Care Team: Guadalupe Maple, MD as PCP - General (Family Medicine) Ralene Bathe, MD (Dermatology) Leandrew Koyanagi, MD as Referring Physician (Ophthalmology)   Assessment:   This is a routine wellness examination for Chas.  Exercise Activities and Dietary recommendations Current Exercise Habits: The patient does not participate in regular exercise at present, Exercise limited by: None identified  Goals    . DIET - INCREASE WATER INTAKE     Recommend drinking at least 6-8 glasses of water a day     . Quit smoking / using tobacco       Fall Risk Fall Risk  12/06/2017 06/11/2017 12/12/2016 11/30/2016 09/10/2016  Falls in the past year? No No No No No   Is the patient's home free of loose throw rugs in walkways, pet beds, electrical cords, etc?   yes      Grab bars in the bathroom? no      Handrails on the stairs?   yes      Adequate lighting?   yes  Timed Get Up and Go  Performed: Completed in 8 seconds with no use of assistive devices, steady gait. No intervention needed at this time.   Depression Screen PHQ 2/9 Scores 12/06/2017 06/11/2017 12/12/2016 11/30/2016  PHQ - 2 Score 0 0 0 0    Cognitive Function     6CIT Screen 12/06/2017 11/30/2016  What Year?  0 points 0 points  What month? 0 points 0 points  What time? 0 points 0 points  Count back from 20 0 points 0 points  Months in reverse 0 points 0 points  Repeat phrase 0 points 2 points  Total Score 0 2    Immunization History  Administered Date(s) Administered  . Influenza, High Dose Seasonal PF 03/25/2017  . Influenza,inj,Quad PF,6+ Mos 06/07/2016  . Pneumococcal Conjugate-13 10/26/2013  . Pneumococcal Polysaccharide-23 01/16/2007  . Td 02/13/2007, 06/11/2017  . Zoster 06/16/2009    Qualifies for Shingles Vaccine? Yes, discussed shingrix vaccine   Screening Tests Health Maintenance  Topic Date Due  . FOOT EXAM  05/25/2016  . INFLUENZA VACCINE  11/21/2017  . HEMOGLOBIN A1C  12/09/2017  . OPHTHALMOLOGY EXAM  05/08/2018  . TETANUS/TDAP  06/12/2027  . PNA vac Low Risk Adult  Completed   Cancer Screenings: Lung: Low Dose CT Chest recommended if Age 7-80 years, 30 pack-year currently smoking OR have quit w/in 15years. Patient does not qualify. Colorectal: no longer required  Additional Screenings:  Hepatitis C Screening: not indicated       Plan:    I have personally reviewed and addressed the Medicare Annual Wellness questionnaire and have noted the following in the patient's chart:  A. Medical and social history B. Use of alcohol, tobacco or illicit drugs  C. Current medications and supplements D. Functional ability and status E.  Nutritional status F.  Physical activity G. Advance directives H. List of other physicians I.  Hospitalizations, surgeries, and ER visits in previous 12 months J.  [place] such as hearing and vision if needed, cognitive and  depression L. Referrals and appointments   In addition, I have reviewed and discussed with patient certain preventive protocols, quality metrics, and best practice recommendations. A written personalized care plan for preventive services as well as general preventive health recommendations were provided to patient.   Signed,  Tyler Aas, LPN Nurse Health Advisor   Nurse Notes: due for diabetic foot exam, CPE on 03/13/2018 with Dr.Crissman.

## 2017-12-06 NOTE — Patient Instructions (Addendum)
Jack Crane , Thank you for taking time to come for your Medicare Wellness Visit. I appreciate your ongoing commitment to your health goals. Please review the following plan we discussed and let me know if I can assist you in the future.   Screening recommendations/referrals: Colonoscopy: no longer required  Recommended yearly ophthalmology/optometry visit for glaucoma screening and checkup Recommended yearly dental visit for hygiene and checkup  Vaccinations: Influenza vaccine: due 12/2017 Pneumococcal vaccine: completed series Tdap vaccine: up to date  Shingles vaccine: shingrix eligible, check with your insurance company for coverage     Advanced directives: Please bring a copy of your health care power of attorney and living will to the office at your convenience.  Conditions/risks identified: Recommend drinking at least 6-8 glasses of water a day   Next appointment:  Follow up in on 03/13/2018 at 10:00am with Dr.Crissman. Follow up in one year for your annual wellness exam.   Preventive Care 65 Years and Older, Male Preventive care refers to lifestyle choices and visits with your health care provider that can promote health and wellness. What does preventive care include?  A yearly physical exam. This is also called an annual well check.  Dental exams once or twice a year.  Routine eye exams. Ask your health care provider how often you should have your eyes checked.  Personal lifestyle choices, including:  Daily care of your teeth and gums.  Regular physical activity.  Eating a healthy diet.  Avoiding tobacco and drug use.  Limiting alcohol use.  Practicing safe sex.  Taking low doses of aspirin every day.  Taking vitamin and mineral supplements as recommended by your health care provider. What happens during an annual well check? The services and screenings done by your health care provider during your annual well check will depend on your age, overall health,  lifestyle risk factors, and family history of disease. Counseling  Your health care provider may ask you questions about your:  Alcohol use.  Tobacco use.  Drug use.  Emotional well-being.  Home and relationship well-being.  Sexual activity.  Eating habits.  History of falls.  Memory and ability to understand (cognition).  Work and work Statistician. Screening  You may have the following tests or measurements:  Height, weight, and BMI.  Blood pressure.  Lipid and cholesterol levels. These may be checked every 5 years, or more frequently if you are over 49 years old.  Skin check.  Lung cancer screening. You may have this screening every year starting at age 77 if you have a 30-pack-year history of smoking and currently smoke or have quit within the past 15 years.  Fecal occult blood test (FOBT) of the stool. You may have this test every year starting at age 77.  Flexible sigmoidoscopy or colonoscopy. You may have a sigmoidoscopy every 5 years or a colonoscopy every 10 years starting at age 77.  Prostate cancer screening. Recommendations will vary depending on your family history and other risks.  Hepatitis C blood test.  Hepatitis B blood test.  Sexually transmitted disease (STD) testing.  Diabetes screening. This is done by checking your blood sugar (glucose) after you have not eaten for a while (fasting). You may have this done every 1-3 years.  Abdominal aortic aneurysm (AAA) screening. You may need this if you are a current or former smoker.  Osteoporosis. You may be screened starting at age 5 if you are at high risk. Talk with your health care provider about your test results,  treatment options, and if necessary, the need for more tests. Vaccines  Your health care provider may recommend certain vaccines, such as:  Influenza vaccine. This is recommended every year.  Tetanus, diphtheria, and acellular pertussis (Tdap, Td) vaccine. You may need a Td booster  every 10 years.  Zoster vaccine. You may need this after age 77.  Pneumococcal 13-valent conjugate (PCV13) vaccine. One dose is recommended after age 77.  Pneumococcal polysaccharide (PPSV23) vaccine. One dose is recommended after age 61. Talk to your health care provider about which screenings and vaccines you need and how often you need them. This information is not intended to replace advice given to you by your health care provider. Make sure you discuss any questions you have with your health care provider. Document Released: 05/06/2015 Document Revised: 12/28/2015 Document Reviewed: 02/08/2015 Elsevier Interactive Patient Education  2017 [place] Prevention in the Home Falls can cause injuries. They can happen to people of all ages. There are many things you can do to make your home safe and to help prevent falls. What can I do on the outside of my home?  Regularly fix the edges of walkways and driveways and fix any cracks.  Remove anything that might make you trip as you walk through a door, such as a raised step or threshold.  Trim any bushes or trees on the path to your home.  Use bright outdoor lighting.  Clear any walking paths of anything that might make someone trip, such as rocks or tools.  Regularly check to see if handrails are loose or broken. Make sure that both sides of any steps have handrails.  Any raised decks and porches should have guardrails on the edges.  Have any leaves, snow, or ice cleared regularly.  Use sand or salt on walking paths during winter.  Clean up any spills in your garage right away. This includes oil or grease spills. What can I do in the bathroom?  Use night lights.  Install grab bars by the toilet and in the tub and shower. Do not use towel bars as grab bars.  Use non-skid mats or decals in the tub or shower.  If you need to sit down in the shower, use a plastic, non-slip stool.  Keep the floor dry. Clean up any  water that spills on the floor as soon as it happens.  Remove soap buildup in the tub or shower regularly.  Attach bath mats securely with double-sided non-slip rug tape.  Do not have throw rugs and other things on the floor that can make you trip. What can I do in the bedroom?  Use night lights.  Make sure that you have a light by your bed that is easy to reach.  Do not use any sheets or blankets that are too big for your bed. They should not hang down onto the floor.  Have a firm chair that has side arms. You can use this for support while you get dressed.  Do not have throw rugs and other things on the floor that can make you trip. What can I do in the kitchen?  Clean up any spills right away.  Avoid walking on wet floors.  Keep items that you use a lot in easy-to-reach places.  If you need to reach something above you, use a strong step stool that has a grab bar.  Keep electrical cords out of the way.  Do not use floor polish or wax that makes floors  slippery. If you must use wax, use non-skid floor wax.  Do not have throw rugs and other things on the floor that can make you trip. What can I do with my stairs?  Do not leave any items on the stairs.  Make sure that there are handrails on both sides of the stairs and use them. Fix handrails that are broken or loose. Make sure that handrails are as long as the stairways.  Check any carpeting to make sure that it is firmly attached to the stairs. Fix any carpet that is loose or worn.  Avoid having throw rugs at the top or bottom of the stairs. If you do have throw rugs, attach them to the floor with carpet tape.  Make sure that you have a light switch at the top of the stairs and the bottom of the stairs. If you do not have them, ask someone to add them for you. What else can I do to help prevent falls?  Wear shoes that:  Do not have high heels.  Have rubber bottoms.  Are comfortable and fit you well.  Are closed  at the toe. Do not wear sandals.  If you use a stepladder:  Make sure that it is fully opened. Do not climb a closed stepladder.  Make sure that both sides of the stepladder are locked into place.  Ask someone to hold it for you, if possible.  Clearly mark and make sure that you can see:  Any grab bars or handrails.  First and last steps.  Where the edge of each step is.  Use tools that help you move around (mobility aids) if they are needed. These include:  Canes.  Walkers.  Scooters.  Crutches.  Turn on the lights when you go into a dark area. Replace any light bulbs as soon as they burn out.  Set up your furniture so you have a clear path. Avoid moving your furniture around.  If any of your floors are uneven, fix them.  If there are any pets around you, be aware of where they are.  Review your medicines with your doctor. Some medicines can make you feel dizzy. This can increase your chance of falling. Ask your doctor what other things that you can do to help prevent falls. This information is not intended to replace advice given to you by your health care provider. Make sure you discuss any questions you have with your health care provider. Document Released: 02/03/2009 Document Revised: 09/15/2015 Document Reviewed: 05/14/2014 Elsevier Interactive Patient Education  2017 [employer].

## 2017-12-19 ENCOUNTER — Encounter: Payer: Medicare Other | Admitting: Family Medicine

## 2018-01-08 ENCOUNTER — Other Ambulatory Visit: Payer: Self-pay | Admitting: Family Medicine

## 2018-01-08 DIAGNOSIS — E7800 Pure hypercholesterolemia, unspecified: Secondary | ICD-10-CM

## 2018-01-23 ENCOUNTER — Other Ambulatory Visit: Payer: Self-pay | Admitting: Family Medicine

## 2018-01-23 NOTE — Telephone Encounter (Signed)
Requested medication (s) are due for refill today:   Not sure.   Can't tell if he is still taking this.  Reported he is not taking on 12/06/17.  Requested medication (s) are on the active medication list:   Yes  Future visit scheduled:   Yes   Last ordered: 10/30/17   Requested Prescriptions  Pending Prescriptions Disp Refills   benazepril (LOTENSIN) 10 MG tablet [Pharmacy Med Name: BENAZEPRIL HCL 10 MG TABLET] 90 tablet 0    Sig: TAKE 1 TABLET BY MOUTH EVERY DAY     Cardiovascular:  ACE Inhibitors Failed - 01/23/2018  2:07 AM      Failed - Cr in normal range and within 180 days    Creatinine, Ser  Date Value Ref Range Status  06/11/2017 0.96 0.76 - 1.27 mg/dL Final         Failed - K in normal range and within 180 days    Potassium  Date Value Ref Range Status  06/11/2017 4.2 3.5 - 5.2 mmol/L Final         Failed - Valid encounter within last 6 months    Recent Outpatient Visits          7 months ago 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 ([place])   [facility] Crissman, Jeannette How, MD   1 year ago Essential hypertension   [place] Crissman, Jeannette How, MD   1 year ago 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

## 2018-03-13 ENCOUNTER — Ambulatory Visit (INDEPENDENT_AMBULATORY_CARE_PROVIDER_SITE_OTHER): Payer: Medicare Other | Admitting: Family Medicine

## 2018-03-13 ENCOUNTER — Encounter: Payer: Self-pay | Admitting: Family Medicine

## 2018-03-13 VITALS — BP 116/75 | HR 65 | Temp 97.3°F | Ht 69.69 in | Wt 213.2 lb

## 2018-03-13 DIAGNOSIS — N181 Chronic kidney disease, stage 1: Secondary | ICD-10-CM

## 2018-03-13 DIAGNOSIS — Z7189 Other specified counseling: Secondary | ICD-10-CM

## 2018-03-13 DIAGNOSIS — E7800 Pure hypercholesterolemia, unspecified: Secondary | ICD-10-CM

## 2018-03-13 DIAGNOSIS — I10 Essential (primary) hypertension: Secondary | ICD-10-CM | POA: Diagnosis not present

## 2018-03-13 DIAGNOSIS — I129 Hypertensive chronic kidney disease with stage 1 through stage 4 chronic kidney disease, or unspecified chronic kidney disease: Secondary | ICD-10-CM

## 2018-03-13 DIAGNOSIS — E1122 Type 2 diabetes mellitus with diabetic chronic kidney disease: Secondary | ICD-10-CM | POA: Diagnosis not present

## 2018-03-13 DIAGNOSIS — M1000 Idiopathic gout, unspecified site: Secondary | ICD-10-CM

## 2018-03-13 DIAGNOSIS — E119 Type 2 diabetes mellitus without complications: Secondary | ICD-10-CM

## 2018-03-13 DIAGNOSIS — Z1329 Encounter for screening for other suspected endocrine disorder: Secondary | ICD-10-CM

## 2018-03-13 DIAGNOSIS — N400 Enlarged prostate without lower urinary tract symptoms: Secondary | ICD-10-CM | POA: Diagnosis not present

## 2018-03-13 LAB — MICROSCOPIC EXAMINATION: BACTERIA UA: NONE SEEN

## 2018-03-13 LAB — URINALYSIS, ROUTINE W REFLEX MICROSCOPIC
BILIRUBIN UA: NEGATIVE
Glucose, UA: NEGATIVE
Ketones, UA: NEGATIVE
Leukocytes, UA: NEGATIVE
NITRITE UA: NEGATIVE
PH UA: 5.5 (ref 5.0–7.5)
Specific Gravity, UA: 1.02 (ref 1.005–1.030)
UUROB: 1 mg/dL (ref 0.2–1.0)

## 2018-03-13 LAB — BAYER DCA HB A1C WAIVED: HB A1C: 6 % (ref <7.0)

## 2018-03-13 MED ORDER — BENAZEPRIL HCL 10 MG PO TABS: 10.0000 mg | ORAL_TABLET | Freq: Every day | ORAL | 4 refills | Status: DC

## 2018-03-13 MED ORDER — ATORVASTATIN CALCIUM 40 MG PO TABS: 40.0000 mg | ORAL_TABLET | Freq: Every day | ORAL | 4 refills | Status: DC

## 2018-03-13 MED ORDER — METFORMIN HCL 500 MG PO TABS: 500.0000 mg | ORAL_TABLET | Freq: Two times a day (BID) | ORAL | 4 refills | Status: DC

## 2018-03-13 NOTE — Assessment & Plan Note (Signed)
The current medical regimen is effective;  continue present plan and medications.  

## 2018-03-13 NOTE — Progress Notes (Signed)
BP 116/75 (BP Location: Left Arm, Patient Position: Sitting, Cuff Size: Normal)   Pulse 65   Temp (!) 97.3 F (36.3 C)   Ht 5' 9.69" (1.77 m)   Wt 213 lb 4 oz (96.7 kg)   SpO2 96%   BMI 30.88 kg/m    Subjective:    [name], male    DOB: 12-13-1940, 77 y.o.   MRN: [id number]  HPI: Jack Crane is a 77 y.o. male  Chief Complaint  Patient presents with  . Annual Exam  Patient all in all doing well maybe had some low blood sugar spells has done a little better with snacking and trying to eat a little something from time to time. Blood pressure cholesterol medicines all doing well without problems or issues taking medications faithfully with good control. Hand cramping issues have resolved.  Relevant past medical, surgical, family and social history reviewed and updated as indicated. Interim medical history since our last visit reviewed. Allergies and medications reviewed and updated.  Review of Systems  Constitutional: Negative.   HENT: Negative.   Eyes: Negative.   Respiratory: Negative.   Cardiovascular: Negative.   Gastrointestinal: Negative.   Endocrine: Negative.   Genitourinary: Negative.   Musculoskeletal: Negative.   Skin: Negative.   Allergic/Immunologic: Negative.   Neurological: Negative.   Hematological: Negative.   Psychiatric/Behavioral: Negative.     Per HPI unless specifically indicated above     Objective:    BP 116/75 (BP Location: Left Arm, Patient Position: Sitting, Cuff Size: Normal)   Pulse 65   Temp (!) 97.3 F (36.3 C)   Ht 5' 9.69" (1.77 m)   Wt 213 lb 4 oz (96.7 kg)   SpO2 96%   BMI 30.88 kg/m   Wt Readings from Last 3 Encounters:  03/13/18 213 lb 4 oz (96.7 kg)  12/06/17 213 lb 9.6 oz (96.9 kg)  06/11/17 225 lb (102.1 kg)    Physical Exam  Constitutional: He is oriented to person, place, and time. He appears well-developed and well-nourished.  HENT:  Head: Normocephalic and atraumatic.  Right Ear:  External ear normal.  Left Ear: External ear normal.  Eyes: Pupils are equal, round, and reactive to light. Conjunctivae and EOM are normal.  Neck: Normal range of motion. Neck supple.  Cardiovascular: Normal rate, regular rhythm, normal heart sounds and intact distal pulses.  Pulmonary/Chest: Effort normal and breath sounds normal.  Abdominal: Soft. Bowel sounds are normal. There is no splenomegaly or hepatomegaly.  Genitourinary: Rectum normal and penis normal.  Genitourinary Comments: BPH changes   Musculoskeletal: Normal range of motion.  Neurological: He is alert and oriented to person, place, and time. He has normal reflexes.  Skin: No rash noted. No erythema.  Psychiatric: He has a normal mood and affect. His behavior is normal. Judgment and thought content normal.    Results for orders placed or performed in visit on 06/11/17  Bayer DCA Hb A1c Waived  Result Value Ref Range   HB A1C (BAYER DCA - WAIVED) 6.4 <1.0 %  Basic metabolic panel  Result Value Ref Range   Glucose 147 (H) 65 - 99 mg/dL   BUN 13 8 - 27 mg/dL   Creatinine, Ser 0.96 0.76 - 1.27 mg/dL   GFR calc non Af Amer 76 >59 mL/min/1.73   GFR calc Af Amer 88 >59 mL/min/1.73   BUN/Creatinine Ratio 14 10 - 24   Sodium 143 134 - 144 mmol/L   Potassium 4.2 3.5 -  5.2 mmol/L   Chloride 103 96 - 106 mmol/L   CO2 24 20 - 29 mmol/L   Calcium 9.0 8.6 - 10.2 mg/dL  LP+ALT+AST Piccolo, Waived  Result Value Ref Range   ALT (SGPT) Piccolo, Waived 21 10 - 47 U/L   AST (SGOT) Piccolo, Waived 23 11 - 38 U/L   Cholesterol Piccolo, Waived 137 <200 mg/dL   HDL Chol Piccolo, Waived 41 (L) >59 mg/dL   Triglycerides Piccolo,Waived 80 <150 mg/dL   Chol/HDL Ratio Piccolo,Waive 3.3 mg/dL   LDL Chol Calc Piccolo Waived 80 <100 mg/dL   VLDL Chol Calc Piccolo,Waive 16 <30 mg/dL      Assessment & Plan:   Problem List Items Addressed This Visit      Cardiovascular and Mediastinum   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       Relevant Medications   metFORMIN (GLUCOPHAGE) 500 MG tablet   benazepril (LOTENSIN) 10 MG tablet   atorvastatin (LIPITOR) 40 MG tablet   Other Relevant Orders   Bayer DCA Hb A1c Waived   Hypertension    The current medical regimen is effective;  continue present plan and medications.       Relevant Medications   benazepril (LOTENSIN) 10 MG tablet   atorvastatin (LIPITOR) 40 MG tablet   Other Relevant Orders   CBC with Differential/Platelet   Comprehensive metabolic panel   Urinalysis, Routine w reflex microscopic     Genitourinary   BPH (benign prostatic hyperplasia)    Stable       Relevant Orders   PSA     Other   Gout   Relevant Orders   Uric acid   Pure hypercholesterolemia    The current medical regimen is effective;  continue present plan and medications.       Relevant Medications   benazepril (LOTENSIN) 10 MG tablet   atorvastatin (LIPITOR) 40 MG tablet   Other Relevant Orders   Lipid Panel w/o Chol/HDL Ratio   Advanced care planning/counseling discussion    A voluntary discussion about advanced care planning including explanation and discussion of advanced directives was extentively discussed with the patient.  Explained about the healthcare proxy and living will was reviewed and packet with forms with expiration of how to fill them out was given.  Time spent: Encounter 16+ min individuals present: Patient       Other Visit Diagnoses    Thyroid disorder screen       Relevant Orders   TSH   Type 2 diabetes mellitus without complication, without long-term current use of insulin ([facility])       Relevant Medications   metFORMIN (GLUCOPHAGE) 500 MG tablet   benazepril (LOTENSIN) 10 MG tablet   atorvastatin (LIPITOR) 40 MG tablet       Follow up plan: Return in about 6 months (around 09/11/2018) for Hemoglobin A1c, BMP,  Lipids, ALT, AST.

## 2018-03-13 NOTE — Assessment & Plan Note (Signed)
A voluntary discussion about advanced care planning including explanation and discussion of advanced directives was extentively discussed with the patient.  Explained about the healthcare proxy and living will was reviewed and packet with forms with expiration of how to fill them out was given.  Time spent: Encounter 16+ min individuals present: Patient 

## 2018-03-13 NOTE — Assessment & Plan Note (Signed)
Stable

## 2018-03-14 LAB — CBC WITH DIFFERENTIAL/PLATELET
BASOS ABS: 0.1 10*3/uL (ref 0.0–0.2)
Basos: 1 %
EOS (ABSOLUTE): 0.2 10*3/uL (ref 0.0–0.4)
Eos: 3 %
HEMOGLOBIN: 14.2 g/dL (ref 13.0–17.7)
Hematocrit: 42.4 % (ref 37.5–51.0)
IMMATURE GRANS (ABS): 0 10*3/uL (ref 0.0–0.1)
Immature Granulocytes: 0 %
Lymphocytes Absolute: 1.2 10*3/uL (ref 0.7–3.1)
Lymphs: 19 %
MCH: 31.6 pg (ref 26.6–33.0)
MCHC: 33.5 g/dL (ref 31.5–35.7)
MCV: 94 fL (ref 79–97)
Monocytes Absolute: 0.5 10*3/uL (ref 0.1–0.9)
Monocytes: 9 %
Neutrophils Absolute: 4.2 10*3/uL (ref 1.4–7.0)
Neutrophils: 68 %
PLATELETS: 208 10*3/uL (ref 150–450)
RBC: 4.49 x10E6/uL (ref 4.14–5.80)
RDW: 12 % — ABNORMAL LOW (ref 12.3–15.4)
WBC: 6.2 10*3/uL (ref 3.4–10.8)

## 2018-03-14 LAB — COMPREHENSIVE METABOLIC PANEL
A/G RATIO: 1.5 (ref 1.2–2.2)
ALT: 16 IU/L (ref 0–44)
AST: 20 IU/L (ref 0–40)
Albumin: 4.1 g/dL (ref 3.5–4.8)
Alkaline Phosphatase: 80 IU/L (ref 39–117)
BUN/Creatinine Ratio: 17 (ref 10–24)
BUN: 17 mg/dL (ref 8–27)
Bilirubin Total: 0.7 mg/dL (ref 0.0–1.2)
CALCIUM: 9.1 mg/dL (ref 8.6–10.2)
CO2: 27 mmol/L (ref 20–29)
Chloride: 103 mmol/L (ref 96–106)
Creatinine, Ser: 1.03 mg/dL (ref 0.76–1.27)
GFR calc Af Amer: 81 mL/min/{1.73_m2} (ref >59)
GFR, EST NON AFRICAN AMERICAN: 70 mL/min/{1.73_m2} (ref >59)
Globulin, Total: 2.7 g/dL (ref 1.5–4.5)
Glucose: 145 mg/dL — ABNORMAL HIGH (ref 65–99)
POTASSIUM: 4.6 mmol/L (ref 3.5–5.2)
Sodium: 143 mmol/L (ref 134–144)
Total Protein: 6.8 g/dL (ref 6.0–8.5)

## 2018-03-14 LAB — LIPID PANEL W/O CHOL/HDL RATIO
CHOLESTEROL TOTAL: 137 mg/dL (ref 100–199)
HDL: 38 mg/dL — AB (ref >39)
LDL CALC: 83 mg/dL (ref 0–99)
TRIGLYCERIDES: 78 mg/dL (ref 0–149)
VLDL Cholesterol Cal: 16 mg/dL (ref 5–40)

## 2018-03-14 LAB — TSH: TSH: 2.93 u[IU]/mL (ref 0.450–4.500)

## 2018-03-14 LAB — PSA: Prostate Specific Ag, Serum: 3.7 ng/mL (ref 0.0–4.0)

## 2018-03-14 LAB — URIC ACID: URIC ACID: 5.8 mg/dL (ref 3.7–8.6)

## 2018-03-17 ENCOUNTER — Encounter: Payer: Self-pay | Admitting: Family Medicine

## 2018-05-26 NOTE — Telephone Encounter (Signed)
closing encounter

## 2018-09-30 ENCOUNTER — Ambulatory Visit: Payer: Medicare Other | Admitting: Family Medicine

## 2018-09-30 ENCOUNTER — Other Ambulatory Visit: Payer: Self-pay

## 2018-09-30 DIAGNOSIS — I10 Essential (primary) hypertension: Secondary | ICD-10-CM

## 2018-09-30 DIAGNOSIS — E1122 Type 2 diabetes mellitus with diabetic chronic kidney disease: Secondary | ICD-10-CM

## 2018-09-30 DIAGNOSIS — E7800 Pure hypercholesterolemia, unspecified: Secondary | ICD-10-CM

## 2018-09-30 DIAGNOSIS — I129 Hypertensive chronic kidney disease with stage 1 through stage 4 chronic kidney disease, or unspecified chronic kidney disease: Secondary | ICD-10-CM

## 2018-10-01 ENCOUNTER — Encounter: Payer: Self-pay | Admitting: Family Medicine

## 2018-10-23 ENCOUNTER — Ambulatory Visit: Payer: Medicare Other | Admitting: Family Medicine

## 2018-10-23 ENCOUNTER — Encounter: Payer: Self-pay | Admitting: Family Medicine

## 2018-10-23 ENCOUNTER — Other Ambulatory Visit: Payer: Self-pay

## 2018-10-23 VITALS — BP 90/55 | HR 81 | Temp 97.8°F

## 2018-10-23 DIAGNOSIS — R42 Dizziness and giddiness: Secondary | ICD-10-CM

## 2018-10-23 DIAGNOSIS — I10 Essential (primary) hypertension: Secondary | ICD-10-CM | POA: Diagnosis not present

## 2018-10-23 MED ORDER — BENAZEPRIL HCL 5 MG PO TABS: 5.0000 mg | ORAL_TABLET | Freq: Every day | ORAL | 0 refills | Status: DC

## 2018-10-23 NOTE — Patient Instructions (Addendum)
Try to drink 6-8 glasses of water daily Stop taking 10 mg benazepril and start taking the 5 mg benazepril I just sent to the drugstore

## 2018-10-23 NOTE — Progress Notes (Signed)
BP (!) 90/55   Pulse 81   Temp 97.8 F (36.6 C) (Oral)   SpO2 94%    Subjective:    [name], male    DOB: [birth year], 78 y.o.   MRN: [id number]  HPI: Jack Crane is a 78 y.o. male  Chief Complaint  Patient presents with  . Hypotension    pt states he has been having low BP since he was seen at the dentist 3 months ago.   Here today for intermittent dizzy spells for 2-3 years now. Worse the last few months. Seems to mostly happen when he first gets up out of bed in the mornings or when he gets up to walk around. No syncope, falls, CP, SOB, sweats. Resolved after a few moments typically. Sxs worse the last few weeks. Went to the dentist and was told his BP was very low. Has not really been checking at home. Notes he doesn't drink water often. Currently on 10 mg benazepril for HTN. Does not check home BSs either, but does not feel like these spells are related to his BS.   Relevant past medical, surgical, family and social history reviewed and updated as indicated. Interim medical history since our last visit reviewed. Allergies and medications reviewed and updated.  Review of Systems  Per HPI unless specifically indicated above     Objective:    BP (!) 90/55   Pulse 81   Temp 97.8 F (36.6 C) (Oral)   SpO2 94%   Wt Readings from Last 3 Encounters:  03/13/18 213 lb 4 oz (96.7 kg)  12/06/17 213 lb 9.6 oz (96.9 kg)  06/11/17 225 lb (102.1 kg)    Physical Exam Vitals signs and nursing note reviewed.  Constitutional:      Appearance: Normal appearance.  HENT:     Head: Atraumatic.  Eyes:     Extraocular Movements: Extraocular movements intact.     Conjunctiva/sclera: Conjunctivae normal.  Neck:     Musculoskeletal: Normal range of motion and neck supple.  Cardiovascular:     Rate and Rhythm: Normal rate and regular rhythm.  Pulmonary:     Effort: Pulmonary effort is normal.     Breath sounds: Normal breath sounds.  Musculoskeletal: Normal  range of motion.  Skin:    General: Skin is warm and dry.  Neurological:     General: No focal deficit present.     Mental Status: He is oriented to person, place, and time.     Coordination: Coordination normal.     Gait: Gait normal.  Psychiatric:        Mood and Affect: Mood normal.        Thought Content: Thought content normal.        Judgment: Judgment normal.     Results for orders placed or performed in visit on 03/13/18  Microscopic Examination   BLD  Result Value Ref Range   WBC, UA 0-5 0 - 5 /hpf   RBC, UA 0-2 0 - 2 /hpf   Epithelial Cells (non renal) CANCELED    Bacteria, UA None seen None seen/Few  Bayer DCA Hb A1c Waived  Result Value Ref Range   HB A1C (BAYER DCA - WAIVED) 6.0 <7.0 %  Uric acid  Result Value Ref Range   Uric Acid 5.8 3.7 - 8.6 mg/dL  CBC with Differential/Platelet  Result Value Ref Range   WBC 6.2 3.4 - 10.8 x10E3/uL   RBC 4.49 4.14 - 5.80  x10E6/uL   Hemoglobin 14.2 13.0 - 17.7 g/dL   Hematocrit 42.4 37.5 - 51.0 %   MCV 94 79 - 97 fL   MCH 31.6 26.6 - 33.0 pg   MCHC 33.5 31.5 - 35.7 g/dL   RDW 12.0 (L) 12.3 - 15.4 %   Platelets 208 150 - 450 x10E3/uL   Neutrophils 68 Not Estab. %   Lymphs 19 Not Estab. %   Monocytes 9 Not Estab. %   Eos 3 Not Estab. %   Basos 1 Not Estab. %   Neutrophils Absolute 4.2 1.4 - 7.0 x10E3/uL   Lymphocytes Absolute 1.2 0.7 - 3.1 x10E3/uL   Monocytes Absolute 0.5 0.1 - 0.9 x10E3/uL   EOS (ABSOLUTE) 0.2 0.0 - 0.4 x10E3/uL   Basophils Absolute 0.1 0.0 - 0.2 x10E3/uL   Immature Granulocytes 0 Not Estab. %   Immature Grans (Abs) 0.0 0.0 - 0.1 x10E3/uL  Comprehensive metabolic panel  Result Value Ref Range   Glucose 145 (H) 65 - 99 mg/dL   BUN 17 8 - 27 mg/dL   Creatinine, Ser 1.03 0.76 - 1.27 mg/dL   GFR calc non Af Amer 70 >59 mL/min/1.73   GFR calc Af Amer 81 >59 mL/min/1.73   BUN/Creatinine Ratio 17 10 - 24   Sodium 143 134 - 144 mmol/L   Potassium 4.6 3.5 - 5.2 mmol/L   Chloride 103 96 - 106 mmol/L    CO2 27 20 - 29 mmol/L   Calcium 9.1 8.6 - 10.2 mg/dL   Total Protein 6.8 6.0 - 8.5 g/dL   Albumin 4.1 3.5 - 4.8 g/dL   Globulin, Total 2.7 1.5 - 4.5 g/dL   Albumin/Globulin Ratio 1.5 1.2 - 2.2   Bilirubin Total 0.7 0.0 - 1.2 mg/dL   Alkaline Phosphatase 80 39 - 117 IU/L   AST 20 0 - 40 IU/L   ALT 16 0 - 44 IU/L  Lipid Panel w/o Chol/HDL Ratio  Result Value Ref Range   Cholesterol, Total 137 100 - 199 mg/dL   Triglycerides 78 0 - 149 mg/dL   HDL 38 (L) >39 mg/dL   VLDL Cholesterol Cal 16 5 - 40 mg/dL   LDL Calculated 83 0 - 99 mg/dL  PSA  Result Value Ref Range   Prostate Specific Ag, Serum 3.7 0.0 - 4.0 ng/mL  TSH  Result Value Ref Range   TSH 2.930 0.450 - 4.500 uIU/mL  Urinalysis, Routine w reflex microscopic  Result Value Ref Range   Specific Gravity, UA 1.020 1.005 - 1.030   pH, UA 5.5 5.0 - 7.5   Color, UA Orange Yellow   Appearance Ur Clear Clear   Leukocytes, UA Negative Negative   Protein, UA Trace (A) Negative/Trace   Glucose, UA Negative Negative   Ketones, UA Negative Negative   RBC, UA Trace (A) Negative   Bilirubin, UA Negative Negative   Urobilinogen, Ur 1.0 0.2 - 1.0 mg/dL   Nitrite, UA Negative Negative   Microscopic Examination See below:       Assessment & Plan:   Problem List Items Addressed This Visit      Cardiovascular and Mediastinum   Hypertension    Will reduce benazepril to 5 mg given low BPs and suspected hypotensive dizziness. Orthostatic vital signs today without significant change positionally but BPs consistently low. Start monitoring pressures at home, call with continued sxs or persistent abnormal readings. Declines EKG today. Recheck in 1 weeks      Relevant Medications   benazepril (LOTENSIN)  5 MG tablet    Other Visit Diagnoses    Dizziness    -  Primary   Suspect related to hypotension. Reduce benazepril dose, drink plenty of fluids. F/u for persistent sxs       Follow up plan: Return in about 1 week (around 10/30/2018)  for BP f/u.

## 2018-10-28 NOTE — Assessment & Plan Note (Addendum)
Will reduce benazepril to 5 mg given low BPs and suspected hypotensive dizziness. Orthostatic vital signs today without significant change positionally but BPs consistently low. Start monitoring pressures at home, call with continued sxs or persistent abnormal readings. Declines EKG today. Recheck in 1 weeks

## 2018-10-30 ENCOUNTER — Ambulatory Visit (INDEPENDENT_AMBULATORY_CARE_PROVIDER_SITE_OTHER): Payer: Medicare Other | Admitting: Family Medicine

## 2018-10-30 ENCOUNTER — Encounter: Payer: Self-pay | Admitting: Family Medicine

## 2018-10-30 ENCOUNTER — Other Ambulatory Visit: Payer: Self-pay

## 2018-10-30 VITALS — BP 89/58 | HR 77 | Temp 97.8°F | Ht 69.6 in | Wt 207.0 lb

## 2018-10-30 DIAGNOSIS — E7800 Pure hypercholesterolemia, unspecified: Secondary | ICD-10-CM | POA: Diagnosis not present

## 2018-10-30 DIAGNOSIS — E119 Type 2 diabetes mellitus without complications: Secondary | ICD-10-CM | POA: Diagnosis not present

## 2018-10-30 DIAGNOSIS — I10 Essential (primary) hypertension: Secondary | ICD-10-CM | POA: Diagnosis not present

## 2018-10-30 LAB — LIPID PANEL PICCOLO, WAIVED
Chol/HDL Ratio Piccolo,Waive: 3.7 mg/dL
Cholesterol Piccolo, Waived: 137 mg/dL (ref <200)
HDL Chol Piccolo, Waived: 37 mg/dL — ABNORMAL LOW (ref >59)
LDL Chol Calc Piccolo Waived: 75 mg/dL (ref <100)
Triglycerides Piccolo,Waived: 129 mg/dL (ref <150)
VLDL Chol Calc Piccolo,Waive: 26 mg/dL (ref <30)

## 2018-10-30 LAB — BAYER DCA HB A1C WAIVED: HB A1C (BAYER DCA - WAIVED): 6.7 % (ref <7.0)

## 2018-10-30 NOTE — Progress Notes (Signed)
BP (!) 89/58   Pulse 77   Temp 97.8 F (36.6 C) (Oral)   Ht 5' 9.6" (1.768 m)   Wt 207 lb (93.9 kg)   BMI 30.04 kg/m    Subjective:    [name], male    DOB: 1940/10/17, 78 y.o.   MRN: [id number]  HPI: Jack Crane is a 78 y.o. male  Chief Complaint  Patient presents with  . Hypertension    1w f/u   Patient here today for 1 week f/u hypotension after cutting benazepril dose in half to 5 mg. Not checking home blood pressures as he does not have a monitor. Trying to drink more water. Does not tend to eat much sodium in his diet. Has not noticed an improvement in his dizziness with standing since cutting back his BP dose. No falls, syncope, visual changes, confusion, CP, SOB, palpitations.   Relevant past medical, surgical, family and social history reviewed and updated as indicated. Interim medical history since our last visit reviewed. Allergies and medications reviewed and updated.  Review of Systems  Per HPI unless specifically indicated above     Objective:    BP (!) 89/58   Pulse 77   Temp 97.8 F (36.6 C) (Oral)   Ht 5' 9.6" (1.768 m)   Wt 207 lb (93.9 kg)   BMI 30.04 kg/m   Wt Readings from Last 3 Encounters:  10/30/18 207 lb (93.9 kg)  03/13/18 213 lb 4 oz (96.7 kg)  12/06/17 213 lb 9.6 oz (96.9 kg)    Physical Exam Vitals signs and nursing note reviewed.  Constitutional:      Appearance: Normal appearance.  HENT:     Head: Atraumatic.  Eyes:     Extraocular Movements: Extraocular movements intact.     Conjunctiva/sclera: Conjunctivae normal.  Neck:     Musculoskeletal: Normal range of motion and neck supple.  Cardiovascular:     Rate and Rhythm: Normal rate and regular rhythm.  Pulmonary:     Effort: Pulmonary effort is normal.     Breath sounds: Normal breath sounds.  Musculoskeletal: Normal range of motion.  Skin:    General: Skin is warm and dry.  Neurological:     General: No focal deficit present.     Mental  Status: He is oriented to person, place, and time.     Motor: No weakness.     Gait: Gait normal.  Psychiatric:        Mood and Affect: Mood normal.        Thought Content: Thought content normal.        Judgment: Judgment normal.     Results for orders placed or performed in visit on 10/30/18  Lipid Panel Piccolo, [employer]  Result Value Ref Range   Cholesterol Piccolo, Waived 137 <200 mg/dL   HDL Chol Piccolo, Waived 37 (L) >59 mg/dL   Triglycerides Piccolo,Waived 129 <150 mg/dL   Chol/HDL Ratio Piccolo,Waive 3.7 mg/dL   LDL Chol Calc Piccolo Waived 75 <100 mg/dL   VLDL Chol Calc Piccolo,Waive 26 <30 mg/dL  Bayer DCA Hb A1c Waived  Result Value Ref Range   HB A1C (BAYER DCA - WAIVED) 6.7 <6.2 %  Basic metabolic panel  Result Value Ref Range   Glucose 126 (H) 65 - 99 mg/dL   BUN 68 (H) 8 - 27 mg/dL   Creatinine, Ser 1.79 (H) 0.76 - 1.27 mg/dL   GFR calc non Af Amer 36 (L) >59 mL/min/1.73  GFR calc Af Amer 41 (L) >59 mL/min/1.73   BUN/Creatinine Ratio 38 (H) 10 - 24   Sodium 140 134 - 144 mmol/L   Potassium 6.3 (H) 3.5 - 5.2 mmol/L   Chloride 106 96 - 106 mmol/L   CO2 18 (L) 20 - 29 mmol/L   Calcium 9.4 8.6 - 10.2 mg/dL      Assessment & Plan:   Problem List Items Addressed This Visit      Cardiovascular and Mediastinum   Hypertension - Primary    No improvement in BP or orthostatic hypotensive sxs since decreasing benazepril dose and increasing fluid intake. Will obtain BMP and d/c benazepril altogether. Script given for BP monitor and encouraged to start logging home readings. Return precautions given for worsening sxs. F/u in 1 week for recheck      Relevant Orders   Basic metabolic panel (Completed)     Other   Pure hypercholesterolemia   Relevant Orders   Lipid Panel Piccolo, Waived (Completed)    Other Visit Diagnoses    Type 2 diabetes mellitus without complication, without long-term current use of insulin ([place])       Relevant Orders   Bayer DCA Hb A1c  Waived (Completed)       Follow up plan: Return in about 1 week (around 11/06/2018) for BP f/u.

## 2018-10-30 NOTE — Patient Instructions (Addendum)
Stop taking benazepril - your blood pressure medicine Drink more water, eat more salt Call me if you're getting more dizzy You can take your prescription for a blood pressure monitor to a medical supply store so you can check your blood pressures at home.   Come back in about a week so we can recheck things

## 2018-10-31 ENCOUNTER — Emergency Department
Admission: EM | Admit: 2018-10-31 | Discharge: 2018-10-31 | Disposition: A | Discharge status: Home / Self Care | Payer: Medicare Other | Attending: Emergency Medicine | Admitting: Emergency Medicine

## 2018-10-31 ENCOUNTER — Other Ambulatory Visit: Payer: Self-pay

## 2018-10-31 ENCOUNTER — Encounter: Payer: Self-pay | Admitting: Emergency Medicine

## 2018-10-31 DIAGNOSIS — R799 Abnormal finding of blood chemistry, unspecified: Secondary | ICD-10-CM | POA: Diagnosis present

## 2018-10-31 DIAGNOSIS — Z7982 Long term (current) use of aspirin: Secondary | ICD-10-CM | POA: Diagnosis not present

## 2018-10-31 DIAGNOSIS — Z87891 Personal history of nicotine dependence: Secondary | ICD-10-CM | POA: Diagnosis not present

## 2018-10-31 DIAGNOSIS — E1122 Type 2 diabetes mellitus with diabetic chronic kidney disease: Secondary | ICD-10-CM | POA: Insufficient documentation

## 2018-10-31 DIAGNOSIS — N189 Chronic kidney disease, unspecified: Secondary | ICD-10-CM | POA: Diagnosis not present

## 2018-10-31 DIAGNOSIS — I129 Hypertensive chronic kidney disease with stage 1 through stage 4 chronic kidney disease, or unspecified chronic kidney disease: Secondary | ICD-10-CM | POA: Diagnosis not present

## 2018-10-31 DIAGNOSIS — Z79899 Other long term (current) drug therapy: Secondary | ICD-10-CM | POA: Insufficient documentation

## 2018-10-31 DIAGNOSIS — I10 Essential (primary) hypertension: Secondary | ICD-10-CM

## 2018-10-31 LAB — BASIC METABOLIC PANEL
Anion gap: 8 (ref 5–15)
BUN/Creatinine Ratio: 38 — ABNORMAL HIGH (ref 10–24)
BUN: 68 mg/dL — ABNORMAL HIGH (ref 8–27)
BUN: 68 mg/dL — ABNORMAL HIGH (ref 8–23)
CO2: 18 mmol/L — ABNORMAL LOW (ref 20–29)
CO2: 17 mmol/L — ABNORMAL LOW (ref 22–32)
Calcium: 9.4 mg/dL (ref 8.6–10.2)
Calcium: 8.6 mg/dL — ABNORMAL LOW (ref 8.9–10.3)
Chloride: 106 mmol/L (ref 96–106)
Chloride: 113 mmol/L — ABNORMAL HIGH (ref 98–111)
Creatinine, Ser: 1.79 mg/dL — ABNORMAL HIGH (ref 0.76–1.27)
Creatinine, Ser: 1.43 mg/dL — ABNORMAL HIGH (ref 0.61–1.24)
GFR calc Af Amer: 41 mL/min/{1.73_m2} — ABNORMAL LOW (ref >59)
GFR calc Af Amer: 54 mL/min — ABNORMAL LOW (ref >60)
GFR calc non Af Amer: 36 mL/min/{1.73_m2} — ABNORMAL LOW (ref >59)
GFR calc non Af Amer: 47 mL/min — ABNORMAL LOW (ref >60)
Glucose, Bld: 149 mg/dL — ABNORMAL HIGH (ref 70–99)
Glucose: 126 mg/dL — ABNORMAL HIGH (ref 65–99)
Potassium: 6.3 mmol/L — ABNORMAL HIGH (ref 3.5–5.2)
Potassium: 4.8 mmol/L (ref 3.5–5.1)
Sodium: 140 mmol/L (ref 134–144)
Sodium: 138 mmol/L (ref 135–145)

## 2018-10-31 LAB — CBC
HCT: 42.2 % (ref 39.0–52.0)
Hemoglobin: 13.9 g/dL (ref 13.0–17.0)
MCH: 30.4 pg (ref 26.0–34.0)
MCHC: 32.9 g/dL (ref 30.0–36.0)
MCV: 92.3 fL (ref 80.0–100.0)
Platelets: 185 10*3/uL (ref 150–400)
RBC: 4.57 MIL/uL (ref 4.22–5.81)
RDW: 12.5 % (ref 11.5–15.5)
WBC: 7.6 10*3/uL (ref 4.0–10.5)
nRBC: 0 % (ref 0.0–0.2)

## 2018-10-31 LAB — TROPONIN I (HIGH SENSITIVITY): Troponin I (High Sensitivity): 3 ng/L (ref <18)

## 2018-10-31 NOTE — Assessment & Plan Note (Signed)
No improvement in BP or orthostatic hypotensive sxs since decreasing benazepril dose and increasing fluid intake. Will obtain BMP and d/c benazepril altogether. Script given for BP monitor and encouraged to start logging home readings. Return precautions given for worsening sxs. F/u in 1 week for recheck

## 2018-10-31 NOTE — ED Triage Notes (Signed)
FIRST NURSE NOTE-here for K 6.3 from PCP.  Ambulatory. NAD.  Pulled next for EKG.

## 2018-10-31 NOTE — ED Provider Notes (Signed)
[facility] Emergency Department Provider Note   ____________________________________________   First MD Initiated Contact with Patient 10/31/18 1327     (approximate)  I have reviewed the triage vital signs and the nursing notes.   HISTORY  Chief Complaint Abnormal Labs   HPI Jack Crane is a 78 y.o. male presents to the ED stating that he was sent here by his PCP because he has abnormal potassium levels.  Patient states that he had labs drawn at his doctor's office in [place].  He states that his blood pressure medication was changed and he started the decreased dosage on his blood pressure medication today.  He denies any symptoms today.     Past Medical History:  Diagnosis Date  . Chronic kidney disease   . Diabetes mellitus without complication ([place])   . Gout   . Hypertension     Patient Active Problem List   Diagnosis Date Noted  . Advanced care planning/counseling discussion 12/12/2016  . BPH (benign prostatic hyperplasia) 12/12/2016  . Inguinal hernia 12/12/2016  . 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 Gout 11/01/2014  . 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 Hypertension 11/01/2014  . Pure hypercholesterolemia 11/01/2014    Past Surgical History:  Procedure Laterality Date  . APPENDECTOMY    . stomach abcess      Prior to Admission medications   Medication Sig Start Date End Date Taking? Authorizing Provider  aspirin 81 MG tablet Take 81 mg by mouth daily.    [provider]  atorvastatin (LIPITOR) 40 MG tablet Take 1 tablet (40 mg total) by mouth daily. 03/13/18   Guadalupe Maple, MD  glucose blood test strip 1 each by Other route as needed for other. Use as instructed    [provider]  metFORMIN (GLUCOPHAGE) 500 MG tablet Take 1 tablet (500 mg total) by mouth 2 (two) times daily with a meal. 03/13/18   Crissman, Jeannette How, MD    Allergies Patient has no known allergies.  Family  History  Problem Relation Age of Onset  . Diabetes Mother   . Cancer Sister        lung  . Diabetes Brother   . Cancer Brother        lung  . Diabetes Brother   . Diabetes Brother     Social History Social History   Tobacco Use  . Smoking status: Former Smoker    Types: Cigarettes    Quit date: 11/02/1974    Years since quitting: 44.0  . Smokeless tobacco: Current User    Types: Chew  Substance Use Topics  . Alcohol use: No    Alcohol/week: 0.0 standard drinks  . Drug use: No    Review of Systems Constitutional: No fever/chills Eyes: No visual changes. Cardiovascular: Denies chest pain. Respiratory: Denies shortness of breath. Gastrointestinal: No abdominal pain.  No nausea, no vomiting.  Genitourinary: Negative for dysuria. Musculoskeletal: Negative for muscle aches. Skin: Negative for rash. Neurological: Negative for headaches, focal weakness or numbness. ____________________________________________   PHYSICAL EXAM:  VITAL SIGNS: ED Triage Vitals  Enc Vitals Group     BP 10/31/18 1209 102/68     Pulse Rate 10/31/18 1208 81     Resp 10/31/18 1208 15     Temp 10/31/18 1208 99.1 F (37.3 C)     Temp Source 10/31/18 1208 Oral     SpO2 10/31/18 1208 96 %     Weight  10/31/18 1209 215 lb (97.5 kg)     Height 10/31/18 1209 5\' 6"  (1.676 m)     Head Circumference --      Peak Flow --      Pain Score 10/31/18 1208 0     Pain Loc --      Pain Edu? --      Excl. in [place]? --    Constitutional: Alert and oriented. Well appearing and in no acute distress. Eyes: Conjunctivae are normal.  Head: Atraumatic. Neck: No stridor.   Cardiovascular: Normal rate, regular rhythm. Grossly normal heart sounds.  Good peripheral circulation. Respiratory: Normal respiratory effort.  No retractions. Lungs CTAB. Musculoskeletal: Patient moves upper and lower extremities without any difficulty.  Patient was noted to be ambulatory in the hallway and in the room without any difficulty or  assistance. Neurologic:  Normal speech and language. No gross focal neurologic deficits are appreciated. No gait instability. Skin:  Skin is warm, dry and intact. No rash noted. Psychiatric: Mood and affect are normal. Speech and behavior are normal.  ____________________________________________   LABS (all labs ordered are listed, but only abnormal results are displayed)  Labs Reviewed  BASIC METABOLIC PANEL - Abnormal; Notable for the following components:      Result Value   Chloride 113 (*)    CO2 17 (*)    Glucose, Bld 149 (*)    BUN 68 (*)    Creatinine, Ser 1.43 (*)    Calcium 8.6 (*)    GFR calc non Af Amer 47 (*)    GFR calc Af Amer 54 (*)    All other components within normal limits  CBC  TROPONIN I (HIGH SENSITIVITY)  TROPONIN I (HIGH SENSITIVITY)   EKG Reviewed by Dr. Corky Downs Shows sinus rhythm with PVCs and a ventricular rate of 86.  Right bundle branch block.  PR interval 142, QRS duration 128 PROCEDURES  Procedure(s) performed (including Critical Care):  Procedures  ___________________________________________   INITIAL IMPRESSION / ASSESSMENT AND PLAN / ED COURSE  As part of my medical decision making, I reviewed the following data within the electronic MEDICAL RECORD NUMBER Notes from prior ED visits and [place] Controlled Substance Database  78 year old male presents to the ED for abnormal labs from his PCPs office.  He was called and told to come to the ED for a reported potassium of 6.3.  EKG was done and showed sinus rhythm with PVCs with a ventricular rate of 86 and a right bundle branch block.  Potassium in the ED was 4.3.  Patient has an appointment with his PCP Thursday of next week.  He states that his blood pressure medication was reduced and today is the first day of the reduced dose.  He denies any dizziness, chest pain, shortness of breath or any other symptoms.  ____________________________________________   FINAL CLINICAL IMPRESSION(S) / ED DIAGNOSES   Final diagnoses:  Essential hypertension     ED Discharge Orders    None       Note:  This document was prepared using Dragon voice recognition software and may include unintentional dictation errors.    Johnn Hai, PA-C 10/31/18 1527    Lavonia Drafts, MD 10/31/18 [phone]

## 2018-10-31 NOTE — ED Triage Notes (Signed)
Pt here from [facility] for abnormal potassium levels.

## 2018-10-31 NOTE — Discharge Instructions (Addendum)
Keep your appointment with Dr. Dwana Melena office next week.  Continue taking your medication as you were told with the 5 mg.  Remain out of the heat and stay hydrated.  Return to the emergency department if any urgent concerns.  Today in the lab at the hospital your potassium was 4.3.

## 2018-11-06 ENCOUNTER — Encounter: Payer: Self-pay | Admitting: Family Medicine

## 2018-11-06 ENCOUNTER — Ambulatory Visit: Payer: Medicare Other | Admitting: Family Medicine

## 2018-11-06 ENCOUNTER — Other Ambulatory Visit: Payer: Self-pay

## 2018-11-06 VITALS — BP 130/79 | HR 61 | Temp 98.0°F

## 2018-11-06 DIAGNOSIS — I10 Essential (primary) hypertension: Secondary | ICD-10-CM

## 2018-11-06 NOTE — Assessment & Plan Note (Signed)
BP significantly improved since d/c of benazepril. Continue off medication, drink plenty of fluids. Call if dizziness returns or if home BPs persistently abnormal

## 2018-11-06 NOTE — Progress Notes (Signed)
BP 130/79   Pulse 61   Temp 98 F (36.7 C) (Oral)   SpO2 90%    Subjective:    [name], male    DOB: [birth year], 78 y.o.   MRN: [id number]  HPI: Jack Crane is a 78 y.o. male  Chief Complaint  Patient presents with  . Hypertension   Here today for 1 week HTN f/u. D/c'd benazepril as decreasing dose did not improving his hypotension or dizziness. Also went to ER less than a week ago as BMP last done here showed a potassium of 6.3. Levels had normalized when checked in ER. Since d/c of benazepril patient has been feeling better without dizzy spells. Not checking home BPs. Has been drinking more fluids, 4-6 glasses per day and trying to eat a little more salt. Denies CP, SOB, HAs.   Relevant past medical, surgical, family and social history reviewed and updated as indicated. Interim medical history since our last visit reviewed. Allergies and medications reviewed and updated.  Review of Systems  Per HPI unless specifically indicated above     Objective:    BP 130/79   Pulse 61   Temp 98 F (36.7 C) (Oral)   SpO2 90%   Wt Readings from Last 3 Encounters:  10/31/18 215 lb (97.5 kg)  10/30/18 207 lb (93.9 kg)  03/13/18 213 lb 4 oz (96.7 kg)    Physical Exam Vitals signs and nursing note reviewed.  Constitutional:      Appearance: Normal appearance.  HENT:     Head: Atraumatic.  Eyes:     Extraocular Movements: Extraocular movements intact.     Conjunctiva/sclera: Conjunctivae normal.  Neck:     Musculoskeletal: Normal range of motion and neck supple.  Cardiovascular:     Rate and Rhythm: Normal rate and regular rhythm.  Pulmonary:     Effort: Pulmonary effort is normal.     Breath sounds: Normal breath sounds.  Musculoskeletal: Normal range of motion.  Skin:    General: Skin is warm and dry.  Neurological:     General: No focal deficit present.     Mental Status: He is oriented to person, place, and time.  Psychiatric:        Mood and  Affect: Mood normal.        Thought Content: Thought content normal.        Judgment: Judgment normal.     Results for orders placed or performed during the hospital encounter of 38/18/29  Basic metabolic panel  Result Value Ref Range   Sodium 138 135 - 145 mmol/L   Potassium 4.8 3.5 - 5.1 mmol/L   Chloride 113 (H) 98 - 111 mmol/L   CO2 17 (L) 22 - 32 mmol/L   Glucose, Bld 149 (H) 70 - 99 mg/dL   BUN 68 (H) 8 - 23 mg/dL   Creatinine, Ser 1.43 (H) 0.61 - 1.24 mg/dL   Calcium 8.6 (L) 8.9 - 10.3 mg/dL   GFR calc non Af Amer 47 (L) >60 mL/min   GFR calc Af Amer 54 (L) >60 mL/min   Anion gap 8 5 - 15  CBC  Result Value Ref Range   WBC 7.6 4.0 - 10.5 K/uL   RBC 4.57 4.22 - 5.81 MIL/uL   Hemoglobin 13.9 13.0 - 17.0 g/dL   HCT 42.2 39.0 - 52.0 %   MCV 92.3 80.0 - 100.0 fL   MCH 30.4 26.0 - 34.0 pg   MCHC 32.9  30.0 - 36.0 g/dL   RDW 12.5 11.5 - 15.5 %   Platelets 185 150 - 400 K/uL   nRBC 0.0 0.0 - 0.2 %  Troponin I (High Sensitivity)  Result Value Ref Range   Troponin I (High Sensitivity) 3 <18 ng/L      Assessment & Plan:   Problem List Items Addressed This Visit      Cardiovascular and Mediastinum   Hypertension - Primary    BP significantly improved since d/c of benazepril. Continue off medication, drink plenty of fluids. Call if dizziness returns or if home BPs persistently abnormal      Relevant Orders   Basic metabolic panel       Follow up plan: Return for as scheduled for 6 month f/u.

## 2018-11-07 LAB — BASIC METABOLIC PANEL
BUN/Creatinine Ratio: 16 (ref 10–24)
BUN: 16 mg/dL (ref 8–27)
CO2: 23 mmol/L (ref 20–29)
Calcium: 9.4 mg/dL (ref 8.6–10.2)
Chloride: 106 mmol/L (ref 96–106)
Creatinine, Ser: 1 mg/dL (ref 0.76–1.27)
GFR calc Af Amer: 84 mL/min/{1.73_m2} (ref >59)
GFR calc non Af Amer: 72 mL/min/{1.73_m2} (ref >59)
Glucose: 105 mg/dL — ABNORMAL HIGH (ref 65–99)
Potassium: 5.5 mmol/L — ABNORMAL HIGH (ref 3.5–5.2)
Sodium: 142 mmol/L (ref 134–144)

## 2018-11-11 ENCOUNTER — Telehealth: Payer: Self-pay | Admitting: Family Medicine

## 2018-11-11 DIAGNOSIS — E875 Hyperkalemia: Secondary | ICD-10-CM

## 2018-11-11 NOTE — Telephone Encounter (Signed)
Patient was notified earlier this morning of results per lab message  Copied from [place] [phone]. Topic: General - Other >> Nov 10, 2018  5:07 PM Percell Belt A wrote: Reason for CRM:  pt called back about this labs results, it was after 5.

## 2018-11-14 ENCOUNTER — Other Ambulatory Visit: Payer: Self-pay | Admitting: Family Medicine

## 2018-11-18 ENCOUNTER — Other Ambulatory Visit: Payer: Self-pay

## 2018-11-18 ENCOUNTER — Other Ambulatory Visit: Payer: Medicare Other

## 2018-11-18 DIAGNOSIS — E875 Hyperkalemia: Secondary | ICD-10-CM

## 2018-11-19 LAB — BASIC METABOLIC PANEL
BUN/Creatinine Ratio: 14 (ref 10–24)
BUN: 14 mg/dL (ref 8–27)
CO2: 25 mmol/L (ref 20–29)
Calcium: 8.8 mg/dL (ref 8.6–10.2)
Chloride: 103 mmol/L (ref 96–106)
Creatinine, Ser: 1.01 mg/dL (ref 0.76–1.27)
GFR calc Af Amer: 83 mL/min/{1.73_m2} (ref >59)
GFR calc non Af Amer: 71 mL/min/{1.73_m2} (ref >59)
Glucose: 124 mg/dL — ABNORMAL HIGH (ref 65–99)
Potassium: 4.3 mmol/L (ref 3.5–5.2)
Sodium: 143 mmol/L (ref 134–144)

## 2018-12-11 ENCOUNTER — Ambulatory Visit (INDEPENDENT_AMBULATORY_CARE_PROVIDER_SITE_OTHER): Payer: Medicare Other

## 2018-12-11 ENCOUNTER — Other Ambulatory Visit: Payer: Self-pay

## 2018-12-11 VITALS — BP 110/62 | HR 58 | Temp 97.6°F | Ht 69.0 in | Wt 212.7 lb

## 2018-12-11 DIAGNOSIS — Z0000 Encounter for general adult medical examination without abnormal findings: Secondary | ICD-10-CM | POA: Diagnosis not present

## 2018-12-11 NOTE — Progress Notes (Signed)
Subjective:   Jack Crane is a 78 y.o. male who presents for Medicare Annual/Subsequent preventive examination.  Review of Systems:   Cardiac Risk Factors include: advanced age (>22men, >59 women);male gender;dyslipidemia;diabetes mellitus;hypertension     Objective:    Vitals: BP 110/62 (BP Location: Right Arm, Patient Position: Sitting, Cuff Size: Normal)   Pulse (!) 58   Temp 97.6 F (36.4 C) (Temporal)   Ht 5\' 9"  (1.753 m)   Wt 212 lb 11.2 oz (96.5 kg)   BMI 31.41 kg/m   Body mass index is 31.41 kg/m.  Advanced Directives 10/31/2018 12/06/2017 11/30/2016  Does Patient Have a Medical Advance Directive? No Yes No  Type of Advance Directive - Living will;Healthcare Power of Attorney -  [place] in Chart? - No - copy requested -  Would patient like information on creating a medical advance directive? No - Patient declined - Yes (MAU/Ambulatory/Procedural Areas - Information given)    Tobacco Social History   Tobacco Use  Smoking Status Former Smoker  . Types: Cigarettes  . Quit date: 11/02/1974  . Years since quitting: 44.1  Smokeless Tobacco Current User  . Types: Chew     Ready to quit: Not Answered Counseling given: Not Answered   Clinical Intake:  Pre-visit preparation completed: Yes  Pain : No/denies pain     Nutritional Status: BMI > 30  Obese Nutritional Risks: None Diabetes: Yes CBG done?: No Did pt. bring in CBG monitor from home?: No  How often do you need to have someone help you when you read instructions, pamphlets, or other written materials from your doctor or pharmacy?: 1 - Never  Nutrition Risk Assessment:  Has the patient had any N/V/D within the last 2 months?  No  Does the patient have any non-healing wounds?  No  Has the patient had any unintentional weight loss or weight gain?  No   Diabetes:  Is the patient diabetic?  Yes  If diabetic, was a CBG obtained today?  No  Did the patient bring in  their glucometer from home?  No  How often do you monitor your CBG's? Every evening, 2 hours after eating    Financial Strains and Diabetes Management:  Are you having any financial strains with the device, your supplies or your medication? No .  Does the patient want to be seen by Chronic Care Management for management of their diabetes?  No  Would the patient like to be referred to a Nutritionist or for Diabetic Management?  No   Diabetic Exams:  Diabetic Eye Exam: Completed 05/08/2017, Overdue for diabetic eye exam. Pt has been advised about the importance in completing this exam. Patient will call and schedule with [facility]   Diabetic Foot Exam:  Pt has been advised about the importance in completing this exam.   Interpreter Needed?: No  Information entered by :: Tiffany Hill,LPN  Past Medical History:  Diagnosis Date  . Chronic kidney disease   . Diabetes mellitus without complication ([place])   . Gout   . Hypertension    Past Surgical History:  Procedure Laterality Date  . APPENDECTOMY    . stomach abcess     Family History  Problem Relation Age of Onset  . Diabetes Mother   . Cancer Sister        lung  . Diabetes Brother   . Cancer Brother        lung  . Diabetes Brother   .  Diabetes Brother    Social History   Socioeconomic History  . Marital status: Single    Spouse name: Not on file  . Number of children: Not on file  . Years of education: Not on file  . Highest education level: 9th grade  Occupational History  . Not on file  Social Needs  . Financial resource strain: Not hard at all  . Food insecurity    Worry: Never true    Inability: Never true  . Transportation needs    Medical: No    Non-medical: No  Tobacco Use  . Smoking status: Former Smoker    Types: Cigarettes    Quit date: 11/02/1974    Years since quitting: 44.1  . Smokeless tobacco: Current User    Types: Chew  Substance and Sexual Activity  . Alcohol use: No     Alcohol/week: 0.0 standard drinks  . Drug use: No  . Sexual activity: Not on file  Lifestyle  . Physical activity    Days per week: 0 days    Minutes per session: 0 min  . Stress: Not at all  Relationships  . Social Herbalist on phone: Once a week    Gets together: More than three times a week    Attends religious service: Never    Active member of club or organization: No    Attends meetings of clubs or organizations: Never    Relationship status: Never married  Other Topics Concern  . Not on file  Social History Narrative  . Not on file    Outpatient Encounter Medications as of 12/11/2018  Medication Sig  . aspirin 81 MG tablet Take 81 mg by mouth daily.  Marland Kitchen atorvastatin (LIPITOR) 40 MG tablet Take 1 tablet (40 mg total) by mouth daily.  Marland Kitchen glucose blood test strip 1 each by Other route as needed for other. Use as instructed  . metFORMIN (GLUCOPHAGE) 500 MG tablet Take 1 tablet (500 mg total) by mouth 2 (two) times daily with a meal.   No facility-administered encounter medications on file as of 12/11/2018.     Activities of Daily Living In your present state of health, do you have any difficulty performing the following activities: 12/11/2018  Hearing? N  Comment no hearing aids  Vision? N  Comment eyeglasses, [facility]  Difficulty concentrating or making decisions? Y  Walking or climbing stairs? N  Dressing or bathing? N  Doing errands, shopping? N  Preparing Food and eating ? N  Using the Toilet? N  In the past six months, have you accidently leaked urine? N  Do you have problems with loss of bowel control? N  Managing your Medications? N  Managing your Finances? N  Housekeeping or managing your Housekeeping? N  Some recent data might be hidden    Patient Care Team: Volney American, PA-C as PCP - General (Family Medicine) Ralene Bathe, MD (Dermatology) Leandrew Koyanagi, MD as Referring Physician (Ophthalmology)   Assessment:    This is a routine wellness examination for Jack Crane.  Exercise Activities and Dietary recommendations Current Exercise Habits: The patient does not participate in regular exercise at present, Type of exercise: walking  Goals    . DIET - INCREASE WATER INTAKE     Recommend drinking at least 6-8 glasses of water a day     . Quit smoking / using tobacco       Fall Risk Fall Risk  12/11/2018 12/06/2017 06/11/2017 12/12/2016  11/30/2016  Falls in the past year? 0 No No No No   FALL RISK PREVENTION PERTAINING TO THE HOME:  Any stairs in or around the home? Yes  If so, are there any without handrails? No   Home free of loose throw rugs in walkways, pet beds, electrical cords, etc? Yes  Adequate lighting in your home to reduce risk of falls? Yes   ASSISTIVE DEVICES UTILIZED TO PREVENT FALLS:  Life alert? No  Use of a cane, walker or w/c? No  Grab bars in the bathroom? No  Shower chair or bench in shower? No  Elevated toilet seat or a handicapped toilet? No    TIMED UP AND GO:  Was the test performed? Yes .  Length of time to ambulate 10 feet: 10 sec.   GAIT:  Appearance of gait: Gait steady and fast without the use of an assistive device.  Education: Fall risk prevention has been discussed.  Intervention(s) required? Yes   DME/home health order needed?  No    Depression Screen PHQ 2/9 Scores 12/11/2018 12/06/2017 06/11/2017 12/12/2016  PHQ - 2 Score 0 0 0 0    Cognitive Function     6CIT Screen 12/11/2018 12/06/2017 11/30/2016  What Year? 0 points 0 points 0 points  What month? 0 points 0 points 0 points  What time? 0 points 0 points 0 points  Count back from 20 0 points 0 points 0 points  Months in reverse 0 points 0 points 0 points  Repeat phrase 2 points 0 points 2 points  Total Score 2 0 2    Immunization History  Administered Date(s) Administered  . Influenza, High Dose Seasonal PF 03/25/2017  . Influenza,inj,Quad PF,6+ Mos 06/07/2016  . Influenza,inj,quad, With  Preservative 02/21/2018  . Pneumococcal Conjugate-13 10/26/2013  . Pneumococcal Polysaccharide-23 01/16/2007  . Td 02/13/2007, 06/11/2017  . Zoster 06/16/2009    Qualifies for Shingles Vaccine? Yes  Zostavax completed 2011. Due for Shingrix. Education has been provided regarding the importance of this vaccine. Pt has been advised to call insurance company to determine out of pocket expense. Advised may also receive vaccine at local pharmacy or Health Dept. Verbalized acceptance and understanding.  Tdap: up to date   Flu Vaccine: Due 12/2018  Pneumococcal Vaccine: up to date   Screening Tests Health Maintenance  Topic Date Due  . FOOT EXAM  05/25/2016  . URINE MICROALBUMIN  11/30/2016  . OPHTHALMOLOGY EXAM  05/08/2018  . INFLUENZA VACCINE  11/22/2018  . HEMOGLOBIN A1C  05/02/2019  . TETANUS/TDAP  06/12/2027  . PNA vac Low Risk Adult  Completed   Cancer Screenings:  Colorectal Screening: no longer required   Lung Cancer Screening: (Low Dose CT Chest recommended if Age 61-80 years, 30 pack-year currently smoking OR have quit w/in 15years.) does not qualify.    Additional Screening:  Hepatitis C Screening: does not qualify  Dental Screening: Recommended annual dental exams for proper oral hygiene  Community Resource Referral:  CRR required this visit?  No        Plan:  I have personally reviewed and addressed the Medicare Annual Wellness questionnaire and have noted the following in the patient's chart:  A. Medical and social history B. Use of alcohol, tobacco or illicit drugs  C. Current medications and supplements D. Functional ability and status E.  Nutritional status F.  Physical activity G. Advance directives H. List of other physicians I.  Hospitalizations, surgeries, and ER visits in previous 12 months J.  Vitals  K. Screenings such as hearing and vision if needed, cognitive and depression L. Referrals and appointments   In addition, I have reviewed and  discussed with patient certain preventive protocols, quality metrics, and best practice recommendations. A written personalized care plan for preventive services as well as general preventive health recommendations were provided to patient.   Signed,   Bevelyn Ngo, LPN  1/58/7276 Nurse Health Advisor  Nurse Notes: none

## 2018-12-11 NOTE — Patient Instructions (Addendum)
Jack Crane , Thank you for taking time to come for your Medicare Wellness Visit. I appreciate your ongoing commitment to your health goals. Please review the following plan we discussed and let me know if I can assist you in the future.   Screening recommendations/referrals: Colonoscopy: no longer required Recommended yearly ophthalmology/optometry visit for glaucoma screening and checkup Recommended yearly dental visit for hygiene and checkup  Vaccinations: Influenza vaccine: due 12/2018 Pneumococcal vaccine: up to date Tdap vaccine: up to date Shingles vaccine: shingrix eligible, check with your insurance for coverage     Advanced directives: copy on file  Conditions/risks identified: diabetic- discussed chronic care management team  Next appointment: Follow up in one year for your annual wellness visit.   Preventive Care 78 Years and Older, Male Preventive care refers to lifestyle choices and visits with your health care provider that can promote health and wellness. What does preventive care include?  A yearly physical exam. This is also called an annual well check.  Dental exams once or twice a year.  Routine eye exams. Ask your health care provider how often you should have your eyes checked.  Personal lifestyle choices, including:  Daily care of your teeth and gums.  Regular physical activity.  Eating a healthy diet.  Avoiding tobacco and drug use.  Limiting alcohol use.  Practicing safe sex.  Taking low doses of aspirin every day.  Taking vitamin and mineral supplements as recommended by your health care provider. What happens during an annual well check? The services and screenings done by your health care provider during your annual well check will depend on your age, overall health, lifestyle risk factors, and family history of disease. Counseling  Your health care provider may ask you questions about your:  Alcohol use.  Tobacco use.  Drug use.   Emotional well-being.  Home and relationship well-being.  Sexual activity.  Eating habits.  History of falls.  Memory and ability to understand (cognition).  Work and work Statistician. Screening  You may have the following tests or measurements:  Height, weight, and BMI.  Blood pressure.  Lipid and cholesterol levels. These may be checked every 5 years, or more frequently if you are over 78 years old.  Skin check.  Lung cancer screening. You may have this screening every year starting at age 78 if you have a 30-pack-year history of smoking and currently smoke or have quit within the past 15 years.  Fecal occult blood test (FOBT) of the stool. You may have this test every year starting at age 78.  Flexible sigmoidoscopy or colonoscopy. You may have a sigmoidoscopy every 5 years or a colonoscopy every 10 years starting at age 78.  Prostate cancer screening. Recommendations will vary depending on your family history and other risks.  Hepatitis C blood test.  Hepatitis B blood test.  Sexually transmitted disease (STD) testing.  Diabetes screening. This is done by checking your blood sugar (glucose) after you have not eaten for a while (fasting). You may have this done every 1-3 years.  Abdominal aortic aneurysm (AAA) screening. You may need this if you are a current or former smoker.  Osteoporosis. You may be screened starting at age 78 if you are at high risk. Talk with your health care provider about your test results, treatment options, and if necessary, the need for more tests. Vaccines  Your health care provider may recommend certain vaccines, such as:  Influenza vaccine. This is recommended every year.  Tetanus, diphtheria, and  acellular pertussis (Tdap, Td) vaccine. You may need a Td booster every 10 years.  Zoster vaccine. You may need this after age 78.  Pneumococcal 13-valent conjugate (PCV13) vaccine. One dose is recommended after age 78.  Pneumococcal  polysaccharide (PPSV23) vaccine. One dose is recommended after age 78. Talk to your health care provider about which screenings and vaccines you need and how often you need them. This information is not intended to replace advice given to you by your health care provider. Make sure you discuss any questions you have with your health care provider. Document Released: 05/06/2015 Document Revised: 12/28/2015 Document Reviewed: 02/08/2015 Elsevier Interactive Patient Education  2017 Jack Crane Prevention in the Home Falls can cause injuries. They can happen to people of all ages. There are many things you can do to make your home safe and to help prevent falls. What can I do on the outside of my home?  Regularly fix the edges of walkways and driveways and fix any cracks.  Remove anything that might make you trip as you walk through a door, such as a raised step or threshold.  Trim any bushes or trees on the path to your home.  Use bright outdoor lighting.  Clear any walking paths of anything that might make someone trip, such as rocks or tools.  Regularly check to see if handrails are loose or broken. Make sure that both sides of any steps have handrails.  Any raised decks and porches should have guardrails on the edges.  Have any leaves, snow, or ice cleared regularly.  Use sand or salt on walking paths during winter.  Clean up any spills in your garage right away. This includes oil or grease spills. What can I do in the bathroom?  Use night lights.  Install grab bars by the toilet and in the tub and shower. Do not use towel bars as grab bars.  Use non-skid mats or decals in the tub or shower.  If you need to sit down in the shower, use a plastic, non-slip stool.  Keep the floor dry. Clean up any water that spills on the floor as soon as it happens.  Remove soap buildup in the tub or shower regularly.  Attach bath mats securely with double-sided non-slip rug tape.   Do not have throw rugs and other things on the floor that can make you trip. What can I do in the bedroom?  Use night lights.  Make sure that you have a light by your bed that is easy to reach.  Do not use any sheets or blankets that are too big for your bed. They should not hang down onto the floor.  Have a firm chair that has side arms. You can use this for support while you get dressed.  Do not have throw rugs and other things on the floor that can make you trip. What can I do in the kitchen?  Clean up any spills right away.  Avoid walking on wet floors.  Keep items that you use a lot in easy-to-reach places.  If you need to reach something above you, use a strong step stool that has a grab bar.  Keep electrical cords out of the way.  Do not use floor polish or wax that makes floors slippery. If you must use wax, use non-skid floor wax.  Do not have throw rugs and other things on the floor that can make you trip. What can I do with my stairs?  Do not leave any items on the stairs.  Make sure that there are handrails on both sides of the stairs and use them. Fix handrails that are broken or loose. Make sure that handrails are as long as the stairways.  Check any carpeting to make sure that it is firmly attached to the stairs. Fix any carpet that is loose or worn.  Avoid having throw rugs at the top or bottom of the stairs. If you do have throw rugs, attach them to the floor with carpet tape.  Make sure that you have a light switch at the top of the stairs and the bottom of the stairs. If you do not have them, ask someone to add them for you. What else can I do to help prevent falls?  Wear shoes that:  Do not have high heels.  Have rubber bottoms.  Are comfortable and fit you well.  Are closed at the toe. Do not wear sandals.  If you use a stepladder:  Make sure that it is fully opened. Do not climb a closed stepladder.  Make sure that both sides of the  stepladder are locked into place.  Ask someone to hold it for you, if possible.  Clearly mark and make sure that you can see:  Any grab bars or handrails.  First and last steps.  Where the edge of each step is.  Use tools that help you move around (mobility aids) if they are needed. These include:  Canes.  Walkers.  Scooters.  Crutches.  Turn on the lights when you go into a dark area. Replace any light bulbs as soon as they burn out.  Set up your furniture so you have a clear path. Avoid moving your furniture around.  If any of your floors are uneven, fix them.  If there are any pets around you, be aware of where they are.  Review your medicines with your doctor. Some medicines can make you feel dizzy. This can increase your chance of falling. Ask your doctor what other things that you can do to help prevent falls. This information is not intended to replace advice given to you by your health care provider. Make sure you discuss any questions you have with your health care provider. Document Released: 02/03/2009 Document Revised: 09/15/2015 Document Reviewed: 05/14/2014 Elsevier Interactive Patient Education  2017 [employer].

## 2019-02-25 ENCOUNTER — Ambulatory Visit (INDEPENDENT_AMBULATORY_CARE_PROVIDER_SITE_OTHER): Payer: Medicare Other | Admitting: Family Medicine

## 2019-02-25 ENCOUNTER — Other Ambulatory Visit: Payer: Self-pay

## 2019-02-25 ENCOUNTER — Encounter: Payer: Self-pay | Admitting: Family Medicine

## 2019-02-25 VITALS — BP 119/75 | HR 77 | Temp 98.5°F

## 2019-02-25 DIAGNOSIS — L989 Disorder of the skin and subcutaneous tissue, unspecified: Secondary | ICD-10-CM | POA: Diagnosis not present

## 2019-02-25 DIAGNOSIS — L723 Sebaceous cyst: Secondary | ICD-10-CM | POA: Diagnosis not present

## 2019-02-25 NOTE — Progress Notes (Signed)
BP 119/75   Pulse 77   Temp 98.5 F (36.9 C) (Oral)   SpO2 98%    Subjective:    [name], male    DOB: 27-Jan-1941, 78 y.o.   MRN: [id number]  HPI: Jack Crane is a 78 y.o. male  Chief Complaint  Patient presents with  . Sore    pt states he has a sore on his left forearm and back of his head on the right side that he would like looked at   Patient presenting today with a firm, enlarging mass that started like a blackhead on right posterior neck behind ear several months ago. Doesn't bother him much aside from some soreness from the skin stretching. Not putting anything on the area. Also has a lesion on his left forearm that has been present and non healing for 2-3 months now. No injury to the area, does not itch, hurt, or bleed. Does not seem to be changing much. Does have a Dermatologist but hasn't seen them for these.   Relevant past medical, surgical, family and social history reviewed and updated as indicated. Interim medical history since our last visit reviewed. Allergies and medications reviewed and updated.  Review of Systems  Per HPI unless specifically indicated above     Objective:    BP 119/75   Pulse 77   Temp 98.5 F (36.9 C) (Oral)   SpO2 98%   Wt Readings from Last 3 Encounters:  12/11/18 212 lb 11.2 oz (96.5 kg)  10/31/18 215 lb (97.5 kg)  10/30/18 207 lb (93.9 kg)    Physical Exam Vitals signs and nursing note reviewed.  Constitutional:      Appearance: Normal appearance.  HENT:     Head: Atraumatic.  Eyes:     Extraocular Movements: Extraocular movements intact.     Conjunctiva/sclera: Conjunctivae normal.  Neck:     Musculoskeletal: Normal range of motion and neck supple.  Cardiovascular:     Rate and Rhythm: Normal rate and regular rhythm.  Pulmonary:     Effort: Pulmonary effort is normal.     Breath sounds: Normal breath sounds.  Musculoskeletal: Normal range of motion.  Skin:    General: Skin is warm and dry.      Findings: Lesion (0.25 cm scabbed lesion present on left forearm, no active bleeding, drainage, redness present) present.     Comments: Sebaceous cyst, firm, no erythema present on right lateral neck posterior to right ear. Nontender to palpation   Neurological:     General: No focal deficit present.     Mental Status: He is oriented to person, place, and time.  Psychiatric:        Mood and Affect: Mood normal.        Thought Content: Thought content normal.        Judgment: Judgment normal.     Results for orders placed or performed in visit on [id number]  Basic metabolic panel  Result Value Ref Range   Glucose 124 (H) 65 - 99 mg/dL   BUN 14 8 - 27 mg/dL   Creatinine, Ser 1.01 0.76 - 1.27 mg/dL   GFR calc non Af Amer 71 >59 mL/min/1.73   GFR calc Af Amer 83 >59 mL/min/1.73   BUN/Creatinine Ratio 14 10 - 24   Sodium 143 134 - 144 mmol/L   Potassium 4.3 3.5 - 5.2 mmol/L   Chloride 103 96 - 106 mmol/L   CO2 25 20 - 29 mmol/L  Calcium 8.8 8.6 - 10.2 mg/dL      Assessment & Plan:   Problem List Items Addressed This Visit    None    Visit Diagnoses    Skin lesion    -  Primary   Concern for non-healing nature, offered shave bx today but pt preferring to see Dermatology for this. He will call to schedule consultation   Sebaceous cyst       No evidence of infection, discussed monitoring vs excision. Pt will schedule with Dermatology to excise area       Follow up plan: Return for as scheduled.

## 2019-03-30 DIAGNOSIS — C4491 Basal cell carcinoma of skin, unspecified: Secondary | ICD-10-CM

## 2019-03-30 HISTORY — DX: Basal cell carcinoma of skin, unspecified: C44.91

## 2019-04-13 ENCOUNTER — Other Ambulatory Visit: Payer: Self-pay

## 2019-04-13 DIAGNOSIS — E7800 Pure hypercholesterolemia, unspecified: Secondary | ICD-10-CM

## 2019-04-13 MED ORDER — ATORVASTATIN CALCIUM 40 MG PO TABS: 40.0000 mg | ORAL_TABLET | Freq: Every day | ORAL | 1 refills | Status: DC

## 2019-04-13 NOTE — Telephone Encounter (Signed)
Patient last seen 10/30/18

## 2019-05-08 ENCOUNTER — Other Ambulatory Visit: Payer: Self-pay

## 2019-05-08 DIAGNOSIS — E119 Type 2 diabetes mellitus without complications: Secondary | ICD-10-CM

## 2019-05-08 MED ORDER — METFORMIN HCL 500 MG PO TABS: 500.0000 mg | ORAL_TABLET | Freq: Two times a day (BID) | ORAL | 0 refills | Status: DC

## 2019-05-08 NOTE — Telephone Encounter (Signed)
Last seen 02/25/2019 with Apolonio Schneiders.

## 2019-05-12 DIAGNOSIS — C4491 Basal cell carcinoma of skin, unspecified: Secondary | ICD-10-CM

## 2019-05-12 HISTORY — DX: Basal cell carcinoma of skin, unspecified: C44.91

## 2019-08-13 ENCOUNTER — Other Ambulatory Visit: Payer: Self-pay | Admitting: Family Medicine

## 2019-08-13 DIAGNOSIS — E119 Type 2 diabetes mellitus without complications: Secondary | ICD-10-CM

## 2019-08-13 NOTE — Telephone Encounter (Signed)
Requested  medications are  due for refill today yes  Requested medications are on the active medication list yes  Last refill 1/25  Future visit scheduled no  Last visit 10/2018  Notes to clinic failed protocol of visit within 6 months

## 2019-08-14 NOTE — Telephone Encounter (Signed)
LVM for pt to call back.

## 2019-08-14 NOTE — Telephone Encounter (Signed)
Significantly overdue for f/u, please schedule

## 2019-08-17 ENCOUNTER — Other Ambulatory Visit: Payer: Self-pay

## 2019-08-17 ENCOUNTER — Encounter: Payer: Self-pay | Admitting: Dermatology

## 2019-08-17 ENCOUNTER — Ambulatory Visit: Payer: Medicare PPO | Admitting: Dermatology

## 2019-08-17 DIAGNOSIS — L570 Actinic keratosis: Secondary | ICD-10-CM | POA: Diagnosis not present

## 2019-08-17 DIAGNOSIS — D229 Melanocytic nevi, unspecified: Secondary | ICD-10-CM

## 2019-08-17 DIAGNOSIS — L719 Rosacea, unspecified: Secondary | ICD-10-CM

## 2019-08-17 DIAGNOSIS — L820 Inflamed seborrheic keratosis: Secondary | ICD-10-CM

## 2019-08-17 DIAGNOSIS — L814 Other melanin hyperpigmentation: Secondary | ICD-10-CM

## 2019-08-17 DIAGNOSIS — D692 Other nonthrombocytopenic purpura: Secondary | ICD-10-CM | POA: Diagnosis not present

## 2019-08-17 DIAGNOSIS — L821 Other seborrheic keratosis: Secondary | ICD-10-CM | POA: Diagnosis not present

## 2019-08-17 DIAGNOSIS — Z85828 Personal history of other malignant neoplasm of skin: Secondary | ICD-10-CM

## 2019-08-17 DIAGNOSIS — D1800 Hemangioma unspecified site: Secondary | ICD-10-CM

## 2019-08-17 DIAGNOSIS — L578 Other skin changes due to chronic exposure to nonionizing radiation: Secondary | ICD-10-CM

## 2019-08-17 DIAGNOSIS — Z1283 Encounter for screening for malignant neoplasm of skin: Secondary | ICD-10-CM | POA: Diagnosis not present

## 2019-08-17 NOTE — Progress Notes (Signed)
Follow-Up Visit   Subjective  INFANTOF BOZZI is a 79 y.o. male who presents for the following: Follow-up (UBSE - History of BCC).   The following portions of the chart were reviewed this encounter and updated as appropriate:  Tobacco  Allergies  Meds  Problems  Med Hx  Surg Hx  Fam Hx      Review of Systems:  No other skin or systemic complaints except as noted in HPI or Assessment and Plan.  Objective  Well appearing patient in no apparent distress; mood and affect are within normal limits.  All skin waist up examined.  Objective  Face (17): Erythematous thin papules/macules with gritty scale.   Objective  Left Anterior Neck: Well healed scar with no evidence of recurrence.   Objective  Face (3): Erythematous keratotic or waxy stuck-on papule or plaque.   Objective  Head - Anterior (Face): Mid face erythema with telangiectasias +/- scattered inflammatory papules.    Assessment & Plan    Skin cancer screening performed today.  Actinic Damage - diffuse scaly erythematous macules with underlying dyspigmentation - Recommend daily broad spectrum sunscreen SPF 30+ to sun-exposed areas, reapply every 2 hours as needed.  - Call for new or changing lesions.  Purpura - Violaceous macules and patches - Benign - Related to age, sun damage and/or use of blood thinners - Observe - Can use OTC arnica containing moisturizer such as Dermend Bruise Formula if desired - Call for worsening or other concerns  Seborrheic Keratoses - Stuck-on, waxy, tan-brown papules and plaques  - Discussed benign etiology and prognosis. - Observe - Call for any changes  Acrochordons (Skin Tags) - Fleshy, skin-colored pedunculated papules - Benign appearing.  - Observe. - If desired, they can be removed with an in office procedure that is not covered by insurance. - Please call the clinic if you notice any new or changing lesions.  Melanocytic Nevi - Tan-brown and/or  pink-flesh-colored symmetric macules and papules - Benign appearing on exam today - Observation - Call clinic for new or changing moles - Recommend daily use of broad spectrum spf 30+ sunscreen to sun-exposed areas.   Hemangiomas - Red papules - Discussed benign nature - Observe - Call for any changes  Lentigines - Scattered tan macules - Discussed due to sun exposure - Benign, observe - Call for any changes   AK (actinic keratosis) (17) Face  Destruction of lesion - Face Complexity: simple   Destruction method: cryotherapy   Informed consent: discussed and consent obtained   Timeout:  patient name, date of birth, surgical site, and procedure verified Lesion destroyed using liquid nitrogen: Yes   Region frozen until ice ball extended beyond lesion: Yes   Outcome: patient tolerated procedure well with no complications   Post-procedure details: wound care instructions given    History of basal cell carcinoma (BCC) Left Anterior Neck  Inflamed seborrheic keratosis (3) Face  Destruction of lesion - Face Complexity: simple   Destruction method: cryotherapy   Informed consent: discussed and consent obtained   Timeout:  patient name, date of birth, surgical site, and procedure verified Lesion destroyed using liquid nitrogen: Yes   Region frozen until ice ball extended beyond lesion: Yes   Outcome: patient tolerated procedure well with no complications   Post-procedure details: wound care instructions given    Rosacea Head - Anterior (Face) Discussed laser option.   Return in about 3 months (around 11/16/2019).    Documentation: I have reviewed the above documentation for  accuracy and completeness, and I agree with the above.  Sarina Ser, MD

## 2019-08-18 ENCOUNTER — Encounter: Payer: Self-pay | Admitting: Dermatology

## 2019-08-18 NOTE — Telephone Encounter (Signed)
Lvm to call us back

## 2019-08-19 ENCOUNTER — Encounter: Payer: Self-pay | Admitting: Family Medicine

## 2019-08-19 NOTE — Telephone Encounter (Signed)
Unable to reach PT,[id number],to make this apt and sent letter.

## 2019-08-29 ENCOUNTER — Other Ambulatory Visit: Payer: Self-pay | Admitting: Family Medicine

## 2019-08-29 DIAGNOSIS — E7800 Pure hypercholesterolemia, unspecified: Secondary | ICD-10-CM

## 2019-08-29 DIAGNOSIS — E119 Type 2 diabetes mellitus without complications: Secondary | ICD-10-CM

## 2019-08-29 NOTE — Telephone Encounter (Signed)
Requested Prescriptions  Pending Prescriptions Disp Refills  . atorvastatin (LIPITOR) 40 MG tablet [Pharmacy Med Name: ATORVASTATIN 40 MG TABLET] 90 tablet 1    Sig: TAKE 1 TABLET BY MOUTH EVERY DAY     Cardiovascular:  Antilipid - Statins Failed - 08/29/2019  9:42 AM      Failed - HDL in normal range and within 360 days    HDL  Date Value Ref Range Status  03/13/2018 38 (L) >39 mg/dL Final         Passed - Total Cholesterol in normal range and within 360 days    Cholesterol Piccolo, Waived  Date Value Ref Range Status  10/30/2018 137 <200 mg/dL Final    Comment:                            Desirable                <200                         Borderline High      200- 239                         High                     >239          Passed - LDL in normal range and within 360 days    LDL Calculated  Date Value Ref Range Status  03/13/2018 83 0 - 99 mg/dL Final         Passed - Triglycerides in normal range and within 360 days    Triglycerides Piccolo,Waived  Date Value Ref Range Status  10/30/2018 129 <150 mg/dL Final    Comment:                            Normal                   <150                         Borderline High     150 - 199                         High                200 - 499                         Very High                >499          Passed - Patient is not pregnant      Passed - Valid encounter within last 12 months    Recent Outpatient Visits          6 months ago Skin lesion   [facility] [place], [place]   9 months ago Essential hypertension   [place], [place], [place]   10 months ago Essential hypertension   [place], [place], [place]   10 months ago Dizziness   [place], [place]   11 months ago 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, Jeannette How, MD      Future Appointments           In 2 months Ralene Bathe, MD [place]           . metFORMIN (GLUCOPHAGE) 500 MG tablet [Pharmacy Med Name: METFORMIN HCL 500 MG TABLET] 60 tablet 0    Sig: TAKE 1 TABLET (500 MG TOTAL) BY MOUTH 2 (TWO) TIMES DAILY WITH A MEAL.     Endocrinology:  Diabetes - Biguanides Failed - 08/29/2019  9:42 AM      Failed - HBA1C is between 0 and 7.9 and within 180 days    Hemoglobin A1C  Date Value Ref Range Status  12/01/2015 7.4  Final   HB A1C (BAYER DCA - WAIVED)  Date Value Ref Range Status  10/30/2018 6.7 <7.0 % Final    Comment:                                          Diabetic Adult            <7.0                                       Healthy Adult        4.3 - 5.7                                                           (DCCT/NGSP) American Diabetes Association's Summary of Glycemic Recommendations for Adults with Diabetes: Hemoglobin A1c <7.0%. More stringent glycemic goals (A1c <6.0%) may further reduce complications at the cost of increased risk of hypoglycemia.          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 [facility] in normal range and within 360 days    Creatinine, Ser  Date Value Ref Range Status  11/18/2018 1.01 0.76 - 1.27 mg/dL Final         Passed - eGFR in normal range and within 360 days    GFR calc Af Amer  Date Value  Ref Range Status  11/18/2018 83 >59 mL/min/1.73 Final   GFR calc non Af Amer  Date Value Ref Range Status  11/18/2018 71 >59 mL/min/1.73 Final

## 2019-09-25 ENCOUNTER — Other Ambulatory Visit: Payer: Self-pay | Admitting: Family Medicine

## 2019-09-25 DIAGNOSIS — E119 Type 2 diabetes mellitus without complications: Secondary | ICD-10-CM

## 2019-10-14 ENCOUNTER — Ambulatory Visit (INDEPENDENT_AMBULATORY_CARE_PROVIDER_SITE_OTHER): Payer: Medicare PPO | Admitting: Family Medicine

## 2019-10-14 ENCOUNTER — Encounter: Payer: Self-pay | Admitting: Family Medicine

## 2019-10-14 ENCOUNTER — Other Ambulatory Visit: Payer: Self-pay

## 2019-10-14 VITALS — BP 124/80 | HR 62 | Temp 98.2°F | Ht 69.0 in | Wt 224.0 lb

## 2019-10-14 DIAGNOSIS — Z1159 Encounter for screening for other viral diseases: Secondary | ICD-10-CM

## 2019-10-14 DIAGNOSIS — I129 Hypertensive chronic kidney disease with stage 1 through stage 4 chronic kidney disease, or unspecified chronic kidney disease: Secondary | ICD-10-CM

## 2019-10-14 DIAGNOSIS — E1159 Type 2 diabetes mellitus with other circulatory complications: Secondary | ICD-10-CM | POA: Diagnosis not present

## 2019-10-14 DIAGNOSIS — Z125 Encounter for screening for malignant neoplasm of prostate: Secondary | ICD-10-CM

## 2019-10-14 DIAGNOSIS — Z0000 Encounter for general adult medical examination without abnormal findings: Secondary | ICD-10-CM | POA: Diagnosis not present

## 2019-10-14 DIAGNOSIS — N400 Enlarged prostate without lower urinary tract symptoms: Secondary | ICD-10-CM

## 2019-10-14 DIAGNOSIS — I10 Essential (primary) hypertension: Secondary | ICD-10-CM

## 2019-10-14 DIAGNOSIS — D692 Other nonthrombocytopenic purpura: Secondary | ICD-10-CM

## 2019-10-14 DIAGNOSIS — E1122 Type 2 diabetes mellitus with diabetic chronic kidney disease: Secondary | ICD-10-CM

## 2019-10-14 DIAGNOSIS — N181 Chronic kidney disease, stage 1: Secondary | ICD-10-CM

## 2019-10-14 DIAGNOSIS — M1000 Idiopathic gout, unspecified site: Secondary | ICD-10-CM

## 2019-10-14 DIAGNOSIS — E7800 Pure hypercholesterolemia, unspecified: Secondary | ICD-10-CM

## 2019-10-14 LAB — UA/M W/RFLX CULTURE, ROUTINE
Bilirubin, UA: NEGATIVE
Glucose, UA: NEGATIVE
Ketones, UA: NEGATIVE
Leukocytes,UA: NEGATIVE
Nitrite, UA: NEGATIVE
Protein,UA: NEGATIVE
Specific Gravity, UA: 1.02 (ref 1.005–1.030)
Urobilinogen, Ur: 0.2 mg/dL (ref 0.2–1.0)
pH, UA: 5.5 (ref 5.0–7.5)

## 2019-10-14 LAB — MICROSCOPIC EXAMINATION
Bacteria, UA: NONE SEEN
WBC, UA: NONE SEEN /hpf (ref 0–5)

## 2019-10-14 NOTE — Progress Notes (Signed)
BP 124/80   Pulse 62   Temp 98.2 F (36.8 C) (Oral)   Ht 5\' 9"  (1.753 m)   Wt 224 lb (101.6 kg)   SpO2 98%   BMI 33.08 kg/m    Subjective:    [name], male    DOB: 07/12/1940, 79 y.o.   MRN: [id number]  HPI: Jack Crane is a 79 y.o. male presenting on 10/14/2019 for comprehensive medical examination. Current medical complaints include:see below  DM - Home BSs 2 hours after dinner typically running 170-190 range. Taking medication faithfully without side effects. States he used to eat mostly frozen meals but trying now to cook more often. Denies low blood sugar spells, polyuria, polyphagia, polydipsia.   HTN - Home BPs 120s/80s consistently. Tolerating medication well, denies CP, SOB, HAs, dizziness.   HLD - on atorvastatin, no myalgias, claudication. Trying to improve diet. Does not exercise regularly.   He currently lives with: Interim Problems from his last visit: no  Depression Screen done today and results listed below:  Depression screen [facility] 2/9 10/14/2019 12/11/2018 12/06/2017 06/11/2017 12/12/2016  Decreased Interest 0 0 0 0 0  Down, Depressed, Hopeless 0 0 0 0 0  PHQ - 2 Score 0 0 0 0 0  Altered sleeping 0 - - - -  Tired, decreased energy 0 - - - -  Change in appetite 0 - - - -  Feeling bad or failure about yourself  0 - - - -  Trouble concentrating 0 - - - -  Moving slowly or fidgety/restless 0 - - - -  Suicidal thoughts 0 - - - -  PHQ-9 Score 0 - - - -    The patient does not have a history of falls. I did complete a risk assessment for falls. A plan of care for falls was documented.   Past Medical History:  Past Medical History:  Diagnosis Date  . Basal cell carcinoma 05/12/2019   left neck inframandibular  . Chronic kidney disease   . Diabetes mellitus without complication ([place])   . Gout   . Hypertension     Surgical History:  Past Surgical History:  Procedure Laterality Date  . APPENDECTOMY    . stomach abcess       Medications:  Current Outpatient Medications on File Prior to Visit  Medication Sig  . aspirin 81 MG tablet Take 81 mg by mouth daily.  Marland Kitchen atorvastatin (LIPITOR) 40 MG tablet TAKE 1 TABLET BY MOUTH EVERY DAY  . glucose blood test strip 1 each by Other route as needed for other. Use as instructed   No current facility-administered medications on file prior to visit.    Allergies:  No Known Allergies  Social History:  Social History   Socioeconomic History  . Marital status: Single    Spouse name: Not on file  . Number of children: Not on file  . Years of education: Not on file  . Highest education level: 9th grade  Occupational History  . Not on file  Tobacco Use  . Smoking status: Former Smoker    Types: Cigarettes    Quit date: 11/02/1974    Years since quitting: 44.9  . Smokeless tobacco: Current User    Types: Chew  Vaping Use  . Vaping Use: Never used  Substance and Sexual Activity  . Alcohol use: No    Alcohol/week: 0.0 standard drinks  . Drug use: No  . Sexual activity: Not on file  Other  Topics Concern  . Not on file  Social History Narrative  . Not on file   Social Determinants of Health   Financial Resource Strain:   . Difficulty of Paying Living Expenses:   Food Insecurity:   . Worried About Charity fundraiser in the Last Year:   . Arboriculturist in the Last Year:   Transportation Needs:   . Film/video editor (Medical):   Marland Kitchen Lack of Transportation (Non-Medical):   Physical Activity:   . Days of Exercise per Week:   . Minutes of Exercise per Session:   Stress:   . Feeling of Stress :   Social Connections:   . Frequency of Communication with Friends and Family:   . Frequency of Social Gatherings with Friends and Family:   . Attends Religious Services:   . Active Member of Clubs or Organizations:   . Attends Archivist Meetings:   Marland Kitchen Marital Status:   Intimate Partner Violence:   . Fear of Current or Ex-Partner:   .  Emotionally Abused:   Marland Kitchen Physically Abused:   . Sexually Abused:    Social History   Tobacco Use  Smoking Status Former Smoker  . Types: Cigarettes  . Quit date: 11/02/1974  . Years since quitting: 44.9  Smokeless Tobacco Current User  . Types: Chew   Social History   Substance and Sexual Activity  Alcohol Use No  . Alcohol/week: 0.0 standard drinks    Family History:  Family History  Problem Relation Age of Onset  . Diabetes Mother   . Cancer Sister        lung  . Diabetes Brother   . Cancer Brother        lung  . Diabetes Brother   . Diabetes Brother     Past medical history, surgical history, medications, allergies, family history and social history reviewed with patient today and changes made to appropriate areas of the chart.   Review of Systems - General ROS: negative Psychological ROS: negative Ophthalmic ROS: negative ENT ROS: negative Allergy and Immunology ROS: negative Hematological and Lymphatic ROS: negative Endocrine ROS: negative Breast ROS: negative for breast lumps Respiratory ROS: no cough, shortness of breath, or wheezing Cardiovascular ROS: no chest pain or dyspnea on exertion Gastrointestinal ROS: no abdominal pain, change in bowel habits, or black or bloody stools Genito-Urinary ROS: no dysuria, trouble voiding, or hematuria Musculoskeletal ROS: negative Neurological ROS: no TIA or stroke symptoms Dermatological ROS: negative All other ROS negative except what is listed above and in the HPI.      Objective:    BP 124/80   Pulse 62   Temp 98.2 F (36.8 C) (Oral)   Ht 5\' 9"  (1.753 m)   Wt 224 lb (101.6 kg)   SpO2 98%   BMI 33.08 kg/m   Wt Readings from Last 3 Encounters:  10/14/19 224 lb (101.6 kg)  12/11/18 212 lb 11.2 oz (96.5 kg)  10/31/18 215 lb (97.5 kg)    Physical Exam Vitals and nursing note reviewed.  Constitutional:      General: He is not in acute distress.    Appearance: He is well-developed.  HENT:     Head:  Atraumatic.     Right Ear: Tympanic membrane and external ear normal.     Left Ear: Tympanic membrane and external ear normal.     Nose: Nose normal.     Mouth/Throat:     Mouth: Mucous membranes are moist.  Pharynx: Oropharynx is clear.  Eyes:     General: No scleral icterus.    Conjunctiva/sclera: Conjunctivae normal.     Pupils: Pupils are equal, round, and reactive to light.  Cardiovascular:     Rate and Rhythm: Normal rate and regular rhythm.     Heart sounds: Normal heart sounds. No murmur heard.   Pulmonary:     Effort: Pulmonary effort is normal. No respiratory distress.     Breath sounds: Normal breath sounds.  Abdominal:     General: Bowel sounds are normal. There is no distension.     Palpations: Abdomen is soft. There is no mass.     Tenderness: There is no abdominal tenderness. There is no guarding.  Genitourinary:    Comments: GU exam declined Musculoskeletal:        General: No tenderness. Normal range of motion.     Cervical back: Normal range of motion and neck supple.  Skin:    General: Skin is warm and dry.     Findings: No rash.  Neurological:     General: No focal deficit present.     Mental Status: He is alert and oriented to person, place, and time.     Deep Tendon Reflexes: Reflexes are normal and symmetric.  Psychiatric:        Mood and Affect: Mood normal.        Behavior: Behavior normal.        Thought Content: Thought content normal.        Judgment: Judgment normal.     Diabetic Foot Exam - Simple   Simple Foot Form Diabetic Foot exam was performed with the following findings: Yes 10/14/2019  2:27 PM  Visual Inspection No deformities, no ulcerations, no other skin breakdown bilaterally: Yes Sensation Testing Intact to touch and monofilament testing bilaterally: Yes Pulse Check Posterior Tibialis and Dorsalis pulse intact bilaterally: Yes Comments     Results for orders placed or performed in visit on 10/14/19  Microscopic  Examination   Urine  Result Value Ref Range   WBC, UA None seen 0 - 5 /hpf   RBC 0-2 0 - 2 /hpf   Epithelial Cells (non renal) 0-10 0 - 10 /hpf   Bacteria, UA None seen None seen/Few  Hepatitis C antibody  Result Value Ref Range   Hep C Virus Ab <0.1 0.0 - 0.9 s/co ratio  CBC with Differential/Platelet  Result Value Ref Range   WBC 6.2 3.4 - 10.8 x10E3/uL   RBC 4.55 4.14 - 5.80 x10E6/uL   Hemoglobin 14.3 13.0 - 17.7 g/dL   Hematocrit 42.9 37.5 - 51.0 %   MCV 94 79 - 97 fL   MCH 31.4 26.6 - 33.0 pg   MCHC 33.3 31 - 35 g/dL   RDW 12.5 11.6 - 15.4 %   Platelets 194 150 - 450 x10E3/uL   Neutrophils 68 Not Estab. %   Lymphs 21 Not Estab. %   Monocytes 8 Not Estab. %   Eos 2 Not Estab. %   Basos 1 Not Estab. %   Neutrophils Absolute 4.2 1 - 7 x10E3/uL   Lymphocytes Absolute 1.3 0 - 3 x10E3/uL   Monocytes Absolute 0.5 0 - 0 x10E3/uL   EOS (ABSOLUTE) 0.2 0.0 - 0.4 x10E3/uL   Basophils Absolute 0.0 0 - 0 x10E3/uL   Immature Granulocytes 0 Not Estab. %   Immature Grans (Abs) 0.0 0.0 - 0.1 x10E3/uL  Comprehensive metabolic panel  Result Value Ref Range  Glucose 165 (H) 65 - 99 mg/dL   BUN 15 8 - 27 mg/dL   Creatinine, Ser 1.11 0.76 - 1.27 mg/dL   GFR calc non Af Amer 63 >59 mL/min/1.73   GFR calc Af Amer 73 >59 mL/min/1.73   BUN/Creatinine Ratio 14 10 - 24   Sodium 138 134 - 144 mmol/L   Potassium 4.5 3.5 - 5.2 mmol/L   Chloride 99 96 - 106 mmol/L   CO2 28 20 - 29 mmol/L   Calcium 9.1 8.6 - 10.2 mg/dL   Total Protein 6.9 6.0 - 8.5 g/dL   Albumin 4.1 3.7 - 4.7 g/dL   Globulin, Total 2.8 1.5 - 4.5 g/dL   Albumin/Globulin Ratio 1.5 1.2 - 2.2   Bilirubin Total 0.4 0.0 - 1.2 mg/dL   Alkaline Phosphatase 85 48 - 121 IU/L   AST 20 0 - 40 IU/L   ALT 18 0 - 44 IU/L  Lipid Panel w/o Chol/HDL Ratio  Result Value Ref Range   Cholesterol, Total 135 100 - 199 mg/dL   Triglycerides 121 0 - 149 mg/dL   HDL 39 (L) >39 mg/dL   VLDL Cholesterol Cal 22 5 - 40 mg/dL   LDL Chol Calc (NIH)  74 0 - 99 mg/dL  UA/M w/rflx Culture, Routine   Specimen: Urine   Urine  Result Value Ref Range   Specific Gravity, UA 1.020 1.005 - 1.030   pH, UA 5.5 5.0 - 7.5   Color, UA Yellow Yellow   Appearance Ur Clear Clear   Leukocytes,UA Negative Negative   Protein,UA Negative Negative/Trace   Glucose, UA Negative Negative   Ketones, UA Negative Negative   RBC, UA Trace (A) Negative   Bilirubin, UA Negative Negative   Urobilinogen, Ur 0.2 0.2 - 1.0 mg/dL   Nitrite, UA Negative Negative   Microscopic Examination See below:   HgB A1c  Result Value Ref Range   Hgb A1c MFr Bld 7.5 (H) 4.8 - 5.6 %   Est. 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 Continue working on diet and exercise improvements      Relevant Orders   UA/M w/rflx Culture, Routine (Completed)   HgB A1c (Completed)   Hypertension associated with diabetes ([place])    Stable and well controlled, continue current regimen      Relevant Orders   CBC with Differential/Platelet (Completed)   Comprehensive metabolic panel (Completed)   [name] (HCC)    Stable, continue to monitor        Genitourinary   BPH (benign prostatic hyperplasia)    Stable without new sxs. Recheck psa and continue to monitor        Other   Gout    Stable without recent flares. Continue to monitor and avoid dietary triggers      Pure hypercholesterolemia    Stable, recheck lipids and adjust if needed. Continue current regimen      Relevant Orders   Lipid Panel w/o Chol/HDL Ratio (Completed)    Other Visit Diagnoses    Annual physical exam       Need for hepatitis C screening test  Relevant Orders   Hepatitis C antibody (Completed)    Screening for prostate cancer       Relevant Orders   PSA (Completed)       Discussed aspirin prophylaxis for myocardial infarction prevention and decision was made to continue ASA  LABORATORY TESTING:  Health maintenance labs ordered today as discussed above.   The natural history of prostate cancer and ongoing controversy regarding screening and potential treatment outcomes of prostate cancer has been discussed with the patient. The meaning of a false positive PSA and a false negative PSA has been discussed. He indicates understanding of the limitations of this screening test and wishes to proceed with screening PSA testing.   IMMUNIZATIONS:   - Tdap: Tetanus vaccination status reviewed: last tetanus booster within 10 years. - Influenza: Up to date - Pneumovax: Up to date - Prevnar: Up to date - HPV: Not applicable - Zostavax vaccine: Up to date  SCREENING: - Colonoscopy: Up to date  Discussed with patient purpose of the colonoscopy is to detect colon cancer at curable precancerous or early stages   PATIENT COUNSELING:    Sexuality: Discussed sexually transmitted diseases, partner selection, use of condoms, avoidance of unintended pregnancy  and contraceptive alternatives.   Advised to avoid cigarette smoking.  I discussed with the patient that most people either abstain from alcohol or drink within safe limits (<=14/week and <=4 drinks/occasion for males, <=7/weeks and <= 3 drinks/occasion for females) and that the risk for alcohol disorders and other health effects rises proportionally with the number of drinks per week and how often a drinker exceeds daily limits.  Discussed cessation/primary prevention of drug use and availability of treatment for abuse.   Diet: Encouraged to adjust caloric intake to maintain  or achieve ideal body weight, to reduce intake of dietary saturated fat and total fat, to limit sodium intake by avoiding high sodium foods and not adding table salt,  and to maintain adequate dietary potassium and calcium preferably from fresh fruits, vegetables, and low-fat dairy products.    stressed the importance of regular exercise  Injury prevention: Discussed safety belts, safety helmets, smoke detector, smoking near bedding or upholstery.   Dental health: Discussed importance of regular tooth brushing, flossing, and dental visits.   Follow up plan: NEXT PREVENTATIVE PHYSICAL DUE IN 1 YEAR. Return in about 6 months (around 04/14/2020) for 6 month f/u.

## 2019-10-15 LAB — COMPREHENSIVE METABOLIC PANEL
ALT: 18 IU/L (ref 0–44)
AST: 20 IU/L (ref 0–40)
Albumin/Globulin Ratio: 1.5 (ref 1.2–2.2)
Albumin: 4.1 g/dL (ref 3.7–4.7)
Alkaline Phosphatase: 85 IU/L (ref 48–121)
BUN/Creatinine Ratio: 14 (ref 10–24)
BUN: 15 mg/dL (ref 8–27)
Bilirubin Total: 0.4 mg/dL (ref 0.0–1.2)
CO2: 28 mmol/L (ref 20–29)
Calcium: 9.1 mg/dL (ref 8.6–10.2)
Chloride: 99 mmol/L (ref 96–106)
Creatinine, Ser: 1.11 mg/dL (ref 0.76–1.27)
GFR calc Af Amer: 73 mL/min/{1.73_m2} (ref >59)
GFR calc non Af Amer: 63 mL/min/{1.73_m2} (ref >59)
Globulin, Total: 2.8 g/dL (ref 1.5–4.5)
Glucose: 165 mg/dL — ABNORMAL HIGH (ref 65–99)
Potassium: 4.5 mmol/L (ref 3.5–5.2)
Sodium: 138 mmol/L (ref 134–144)
Total Protein: 6.9 g/dL (ref 6.0–8.5)

## 2019-10-15 LAB — CBC WITH DIFFERENTIAL/PLATELET
Basophils Absolute: 0 10*3/uL (ref 0.0–0.2)
Basos: 1 %
EOS (ABSOLUTE): 0.2 10*3/uL (ref 0.0–0.4)
Eos: 2 %
Hematocrit: 42.9 % (ref 37.5–51.0)
Hemoglobin: 14.3 g/dL (ref 13.0–17.7)
Immature Grans (Abs): 0 10*3/uL (ref 0.0–0.1)
Immature Granulocytes: 0 %
Lymphocytes Absolute: 1.3 10*3/uL (ref 0.7–3.1)
Lymphs: 21 %
MCH: 31.4 pg (ref 26.6–33.0)
MCHC: 33.3 g/dL (ref 31.5–35.7)
MCV: 94 fL (ref 79–97)
Monocytes Absolute: 0.5 10*3/uL (ref 0.1–0.9)
Monocytes: 8 %
Neutrophils Absolute: 4.2 10*3/uL (ref 1.4–7.0)
Neutrophils: 68 %
Platelets: 194 10*3/uL (ref 150–450)
RBC: 4.55 x10E6/uL (ref 4.14–5.80)
RDW: 12.5 % (ref 11.6–15.4)
WBC: 6.2 10*3/uL (ref 3.4–10.8)

## 2019-10-15 LAB — LIPID PANEL W/O CHOL/HDL RATIO
Cholesterol, Total: 135 mg/dL (ref 100–199)
HDL: 39 mg/dL — ABNORMAL LOW (ref >39)
LDL Chol Calc (NIH): 74 mg/dL (ref 0–99)
Triglycerides: 121 mg/dL (ref 0–149)
VLDL Cholesterol Cal: 22 mg/dL (ref 5–40)

## 2019-10-15 LAB — HEMOGLOBIN A1C
Est. average glucose Bld gHb Est-mCnc: 169 mg/dL
Hgb A1c MFr Bld: 7.5 % — ABNORMAL HIGH (ref 4.8–5.6)

## 2019-10-15 LAB — HEPATITIS C ANTIBODY: Hep C Virus Ab: <0.1 s/co ratio (ref 0.0–0.9)

## 2019-10-15 LAB — PSA: Prostate Specific Ag, Serum: 4.4 ng/mL — ABNORMAL HIGH (ref 0.0–4.0)

## 2019-10-16 ENCOUNTER — Other Ambulatory Visit: Payer: Self-pay | Admitting: Family Medicine

## 2019-10-16 DIAGNOSIS — R972 Elevated prostate specific antigen [PSA]: Secondary | ICD-10-CM

## 2019-10-16 MED ORDER — METFORMIN HCL 1000 MG PO TABS: 1000.0000 mg | ORAL_TABLET | Freq: Two times a day (BID) | ORAL | 1 refills | Status: DC

## 2019-10-18 NOTE — Assessment & Plan Note (Signed)
Stable without recent flares. Continue to monitor and avoid dietary triggers

## 2019-10-18 NOTE — Assessment & Plan Note (Signed)
Recheck A1C, adjust as needed. Continue working on diet and exercise improvements

## 2019-10-18 NOTE — Assessment & Plan Note (Signed)
Stable and well controlled, continue current regimen 

## 2019-10-18 NOTE — Assessment & Plan Note (Signed)
Stable, recheck lipids and adjust if needed. Continue current regimen

## 2019-10-18 NOTE — Assessment & Plan Note (Signed)
Stable without new sxs. Recheck psa and continue to monitor

## 2019-10-18 NOTE — Assessment & Plan Note (Signed)
Stable, continue to monitor  ?

## 2019-11-05 ENCOUNTER — Ambulatory Visit: Payer: Medicare PPO | Admitting: Urology

## 2019-11-05 ENCOUNTER — Encounter: Payer: Self-pay | Admitting: Urology

## 2019-11-05 ENCOUNTER — Other Ambulatory Visit: Payer: Self-pay

## 2019-11-05 VITALS — BP 123/75 | HR 65 | Ht 69.0 in | Wt 223.4 lb

## 2019-11-05 DIAGNOSIS — Z125 Encounter for screening for malignant neoplasm of prostate: Secondary | ICD-10-CM

## 2019-11-05 NOTE — Patient Instructions (Signed)
Prostate Cancer Screening  Prostate cancer screening is a test that is done to check for the presence of prostate cancer in men. The prostate gland is a walnut-sized gland that is located below the bladder and in front of the rectum in males. The function of the prostate is to add fluid to semen during ejaculation. Prostate cancer is the second most common type of cancer in men. Who should have prostate cancer screening?  Screening recommendations vary based on age and other risk factors. Screening is recommended if:  You are older than age 55. If you are age 55-69, talk with your health care provider about your need for screening and how often screening should be done. Because most prostate cancers are slow growing and will not cause death, screening is generally reserved in this age group for men who have a 10-15-year life expectancy.  You are younger than age 55, and you have these risk factors: ? Being a black male or a male of African descent. ? Having a father, brother, or uncle who has been diagnosed with prostate cancer. The risk is higher if your family member's cancer occurred at an early age. Screening is not recommended if:  You are younger than age 40.  You are between the ages of 40 and 54 and you have no risk factors.  You are 70 years of age or older. At this age, the risks that screening can cause are greater than the benefits that it may provide. If you are at high risk for prostate cancer, your health care provider may recommend that you have screenings more often or that you start screening at a younger age. How is screening for prostate cancer done? The recommended prostate cancer screening test is a blood test called the prostate-specific antigen (PSA) test. PSA is a protein that is made in the prostate. As you age, your prostate naturally produces more PSA. Abnormally high PSA levels may be caused by:  Prostate cancer.  An enlarged prostate that is not caused by cancer  (benign prostatic hyperplasia, BPH). This condition is very common in older men.  A prostate gland infection (prostatitis). Depending on the PSA results, you may need more tests, such as:  A physical exam to check the size of your prostate gland.  Blood and imaging tests.  A procedure to remove tissue samples from your prostate gland for testing (biopsy). What are the benefits of prostate cancer screening?  Screening can help to identify cancer at an early stage, before symptoms start and when the cancer can be treated more easily.  There is a small chance that screening may lower your risk of dying from prostate cancer. The chance is small because prostate cancer is a slow-growing cancer, and most men with prostate cancer die from a different cause. What are the risks of prostate cancer screening? The main risk of prostate cancer screening is diagnosing and treating prostate cancer that would never have caused any symptoms or problems. This is called overdiagnosisand overtreatment. PSA screening cannot tell you if your PSA is high due to cancer or a different cause. A prostate biopsy is the only procedure to diagnose prostate cancer. Even the results of a biopsy may not tell you if your cancer needs to be treated. Slow-growing prostate cancer may not need any treatment other than monitoring, so diagnosing and treating it may cause unnecessary stress or other side effects. A prostate biopsy may also cause:  Infection or fever.  A false negative. This is   a result that shows that you do not have prostate cancer when you actually do have prostate cancer. Questions to ask your health care provider  When should I start prostate cancer screening?  What is my risk for prostate cancer?  How often do I need screening?  What type of screening tests do I need?  How do I get my test results?  What do my results mean?  Do I need treatment? Where to find more information  The American Cancer  Society: www.cancer.org  American Urological Association: www.auanet.org Contact a health care provider if:  You have difficulty urinating.  You have pain when you urinate or ejaculate.  You have blood in your urine or semen.  You have pain in your back or in the area of your prostate. Summary  Prostate cancer is a common type of cancer in men. The prostate gland is located below the bladder and in front of the rectum. This gland adds fluid to semen during ejaculation.  Prostate cancer screening may identify cancer at an early stage, when the cancer can be treated more easily.  The prostate-specific antigen (PSA) test is the recommended screening test for prostate cancer.  Discuss the risks and benefits of prostate cancer screening with your health care provider. If you are age 70 or older, the risks that screening can cause are greater than the benefits that it may provide. This information is not intended to replace advice given to you by your health care provider. Make sure you discuss any questions you have with your health care provider. Document Revised: 11/20/2018 Document Reviewed: 11/20/2018 Elsevier Patient Education  2020 [employer].  

## 2019-11-05 NOTE — Progress Notes (Signed)
   11/05/19 10:45 AM   [name] January 27, 1941 [id number]  CC: "Elevated PSA"  HPI: I saw Mr. Sehgal in urology clinic today for "elevated PSA."  He is a 79 year old male with diabetes and CKD who was recently found to have a PSA of 4.4 on routine screening.  This was increased only slightly from PSA of 3.7 two years ago, and 3 in 2018.  He denies any family history of prostate cancer.  He denies any gross hematuria, weight loss, bone pain, or urinary symptoms.  He has never undergone prostate biopsy previously.  PMH: Past Medical History:  Diagnosis Date  . Basal cell carcinoma 05/12/2019   left neck inframandibular  . Chronic kidney disease   . Diabetes mellitus without complication ([place])   . Gout   . Hypertension     Surgical History: Past Surgical History:  Procedure Laterality Date  . APPENDECTOMY    . stomach abcess      Family History: Family History  Problem Relation Age of Onset  . Diabetes Mother   . Cancer Sister        lung  . Diabetes Brother   . Cancer Brother        lung  . Diabetes Brother   . Diabetes Brother     Social History:  reports that he quit smoking about 45 years ago. His smoking use included cigarettes. His smokeless tobacco use includes chew. He reports that he does not drink alcohol and does not use drugs.  Physical Exam: BP 123/75 (BP Location: Left Arm, Patient Position: Sitting, Cuff Size: Large)   Pulse 65   Ht 5\' 9"  (1.753 m)   Wt 223 lb 6.4 oz (101.3 kg)   BMI 32.99 kg/m    Constitutional:  Alert and oriented, No acute distress. Cardiovascular: No clubbing, cyanosis, or edema. Respiratory: Normal respiratory effort, no increased work of breathing. GI: Abdomen is soft, nontender, nondistended, no abdominal masses DRE: 60 g, smooth, no nodules or masses  Laboratory Data: PSA history reviewed, see HPI  Pertinent Imaging: None to review  Assessment & Plan:   He is a 79 year old male with a PSA of 4.4 that has  increased only slightly over the last few years from 3.7 in 2019, and 3 in 2018.  DRE is benign, and he has no family history of prostate cancer.  We reviewed at length the AUA guidelines that do not recommend routine screening in men over age 80.  We also reviewed that a normal PSA for men in their 42s is less than 6.5.  We reviewed his reassuring PSA trend, and that is normal for the PSA to continue to increase as patient's age.  With his normal PSA for his age group, and the fact that he is well outside the age of routine screening, I recommended discontinuing PSA screening and no further work-up.  We discussed the very low, but not 0 risk, of missing a clinically significant prostate cancer.  He is in agreement with this plan moving forward which I think is very reasonable.  Discontinue PSA screening, follow-up as needed  I spent 30 total minutes on the day of the encounter including pre-visit review of the medical record, face-to-face time with the patient, and post visit ordering of labs/imaging/tests.   Nickolas Madrid, MD 11/05/2019  [facility] [address], [place], [place] [address] [phone]

## 2019-11-12 LAB — HM DIABETES EYE EXAM

## 2019-11-18 ENCOUNTER — Other Ambulatory Visit: Payer: Self-pay

## 2019-11-18 ENCOUNTER — Ambulatory Visit: Payer: Medicare PPO | Admitting: Dermatology

## 2019-11-18 DIAGNOSIS — D692 Other nonthrombocytopenic purpura: Secondary | ICD-10-CM | POA: Diagnosis not present

## 2019-11-18 DIAGNOSIS — L719 Rosacea, unspecified: Secondary | ICD-10-CM

## 2019-11-18 DIAGNOSIS — B078 Other viral warts: Secondary | ICD-10-CM

## 2019-11-18 DIAGNOSIS — L570 Actinic keratosis: Secondary | ICD-10-CM

## 2019-11-18 DIAGNOSIS — L578 Other skin changes due to chronic exposure to nonionizing radiation: Secondary | ICD-10-CM | POA: Diagnosis not present

## 2019-11-18 MED ORDER — METRONIDAZOLE 0.75 % EX GEL: CUTANEOUS | 11 refills | Status: DC

## 2019-11-18 NOTE — Patient Instructions (Signed)
Cryotherapy Aftercare  . Wash gently with soap and water everyday.   . Apply Vaseline and Band-Aid daily until healed. Recommend daily broad spectrum sunscreen SPF 30+ to sun-exposed areas, reapply every 2 hours as needed. Call for new or changing lesions.  

## 2019-11-18 NOTE — Progress Notes (Signed)
   Follow-Up Visit   Subjective  Jack Crane is a 79 y.o. male who presents for the following: Follow-up.  Patient here for AK follow up at the face and ears. He has a few spots on his hands that have been there for months and he uses a "grinder" on them to file down like a callous. He also would like a refill for metronidazole 0.75% gel that he uses on his nose.  The following portions of the chart were reviewed this encounter and updated as appropriate:  Tobacco  Allergies  Meds  Problems  Med Hx  Surg Hx  Fam Hx     Review of Systems:  No other skin or systemic complaints except as noted in HPI or Assessment and Plan.  Objective  Well appearing patient in no apparent distress; mood and affect are within normal limits.  A focused examination was performed including face, ears, hands and arms. Relevant physical exam findings are noted in the Assessment and Plan.  Objective  Nose: Few resolving papules otherwise clear  Objective  Arms and hands x 12, face x 12 (24): Erythematous thin papules/macules with gritty scale.   Objective  Right Dorsal Hand middle finger MCP: Verrucous papules -- Discussed viral etiology and contagion.   Objective  Arms: Violaceous macules and patches.    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) Arms and hands x 12, face x 12  Destruction of lesion - Arms and hands x 12, face x 12 Complexity: simple   Destruction method: cryotherapy   Informed consent: discussed and consent obtained   Timeout:  patient name, date of birth, surgical site, and procedure verified Lesion destroyed using liquid nitrogen: Yes   Region frozen until ice ball extended beyond lesion: Yes   Outcome: patient tolerated procedure well with no complications   Post-procedure details: wound care instructions given    Other viral  warts Right Dorsal Hand middle finger MCP  Destruction of lesion - Right Dorsal Hand middle finger MCP Complexity: simple   Destruction method: cryotherapy   Informed consent: discussed and consent obtained   Timeout:  patient name, date of birth, surgical site, and procedure verified Lesion destroyed using liquid nitrogen: Yes   Region frozen until ice ball extended beyond lesion: Yes   Outcome: patient tolerated procedure well with no complications   Post-procedure details: wound care instructions given    Senile purpura (HCC) Arms Benign, observe.    Actinic Damage - severe - diffuse scaly erythematous macules with underlying dyspigmentation - Recommend daily broad spectrum sunscreen SPF 30+ to sun-exposed areas, reapply every 2 hours as needed.  - Call for new or changing lesions.  Return in about 1 year (around 11/17/2020) for UBSE.  Graciella Belton, RMA, am acting as scribe for Sarina Ser, MD . Documentation: I have reviewed the above documentation for accuracy and completeness, and I agree with the above.  Sarina Ser, MD

## 2019-11-22 ENCOUNTER — Encounter: Payer: Self-pay | Admitting: Dermatology

## 2019-12-03 ENCOUNTER — Encounter: Payer: Self-pay | Admitting: Family Medicine

## 2019-12-07 ENCOUNTER — Telehealth: Payer: Self-pay | Admitting: Family Medicine

## 2019-12-07 NOTE — Telephone Encounter (Signed)
Copied from [place] [id number]. Topic: Medicare AWV >> Dec 07, 2019 11:57 AM Cher Nakai R wrote: Reason for CRM:   Left message for patient to call back and schedule the Medicare Annual Wellness Visit (AWV) virtually.  Last AWV 12/11/2018  Please schedule at anytime with [facility].  45 minute appointment  Any questions, please call me at [phone]

## 2019-12-21 ENCOUNTER — Ambulatory Visit (INDEPENDENT_AMBULATORY_CARE_PROVIDER_SITE_OTHER): Payer: Medicare PPO

## 2019-12-21 VITALS — Ht 66.0 in | Wt 220.0 lb

## 2019-12-21 DIAGNOSIS — Z0000 Encounter for general adult medical examination without abnormal findings: Secondary | ICD-10-CM | POA: Diagnosis not present

## 2019-12-21 NOTE — Progress Notes (Signed)
I connected with Jack Crane today by telephone and verified that I am speaking with the correct person using two identifiers. Location patient: home Location provider: work Persons participating in the virtual visit: [name], [name].   I discussed the limitations, risks, security and privacy concerns of performing an evaluation and management service by telephone and the availability of in person appointments. I also discussed with the patient that there may be a patient responsible charge related to this service. The patient expressed understanding and verbally consented to this telephonic visit.    Interactive audio and video telecommunications were attempted between this provider and patient, however failed, due to patient having technical difficulties OR patient did not have access to video capability.  We continued and completed visit with audio only.    Vital signs may be patient reported or missing.   Subjective:   Jack Crane is a 79 y.o. male who presents for Medicare Annual/Subsequent preventive examination.  Review of Systems     Cardiac Risk Factors include: advanced age (>57men, >60 women);diabetes mellitus;hypertension;male gender;obesity (BMI >30kg/m2);smoking/ tobacco exposure;sedentary lifestyle     Objective:    Today's Vitals   12/21/19 1440  Weight: 220 lb (99.8 kg)  Height: 5\' 6"  (1.676 m)   Body mass index is 35.51 kg/m.  Advanced Directives 12/21/2019 12/11/2018 10/31/2018 12/06/2017 11/30/2016  Does Patient Have a Medical Advance Directive? Yes Yes No Yes No  Type of Paramedic of [place];Living will Living will;Healthcare Power of Attorney - Living will;Healthcare Power of Attorney -  Copy of [place] in Chart? No - copy requested Yes - validated most recent copy scanned in chart (See row information) - No - copy requested -  Would patient like information on creating a medical advance  directive? - - No - Patient declined - Yes (MAU/Ambulatory/Procedural Areas - Information given)    Current Medications (verified) Outpatient Encounter Medications as of 12/21/2019  Medication Sig  . aspirin 81 MG tablet Take 81 mg by mouth daily.  Marland Kitchen atorvastatin (LIPITOR) 40 MG tablet TAKE 1 TABLET BY MOUTH EVERY DAY  . glucose blood test strip 1 each by Other route as needed for other. Use as instructed  . metFORMIN (GLUCOPHAGE) 1000 MG tablet Take 1 tablet (1,000 mg total) by mouth 2 (two) times daily with a meal.  . metroNIDAZOLE (METROGEL) 0.75 % gel Apply to affected areas face 1-2 times daily as needed   No facility-administered encounter medications on file as of 12/21/2019.    Allergies (verified) Patient has no known allergies.   History: Past Medical History:  Diagnosis Date  . Basal cell carcinoma 05/12/2019   left neck inframandibular  . Chronic kidney disease   . Diabetes mellitus without complication ([place])   . Gout   . Hypertension    Past Surgical History:  Procedure Laterality Date  . APPENDECTOMY    . stomach abcess     Family History  Problem Relation Age of Onset  . Diabetes Mother   . Cancer Sister        lung  . Diabetes Brother   . Cancer Brother        lung  . Diabetes Brother   . Diabetes Brother    Social History   Socioeconomic History  . Marital status: Single    Spouse name: Not on file  . Number of children: Not on file  . Years of education: Not on file  . Highest education level:  9th grade  Occupational History  . Not on file  Tobacco Use  . Smoking status: Former Smoker    Types: Cigarettes    Quit date: 11/02/1974    Years since quitting: 45.1  . Smokeless tobacco: Current User    Types: Chew  Vaping Use  . Vaping Use: Never used  Substance and Sexual Activity  . Alcohol use: No    Alcohol/week: 0.0 standard drinks  . Drug use: No  . Sexual activity: Yes    Birth control/protection: None  Other Topics Concern  . Not  on file  Social History Narrative  . Not on file   Social Determinants of Health   Financial Resource Strain: Low Risk   . Difficulty of Paying Living Expenses: Not hard at all  Food Insecurity: No Food Insecurity  . Worried About Charity fundraiser in the Last Year: Never true  . Ran Out of Food in the Last Year: Never true  Transportation Needs: No Transportation Needs  . Lack of Transportation (Medical): No  . Lack of Transportation (Non-Medical): No  Physical Activity: Inactive  . Days of Exercise per Week: 0 days  . Minutes of Exercise per Session: 0 min  Stress: No Stress Concern Present  . Feeling of Stress : Not at all  Social Connections:   . Frequency of Communication with Friends and Family: Not on file  . Frequency of Social Gatherings with Friends and Family: Not on file  . Attends Religious Services: Not on file  . Active Member of Clubs or Organizations: Not on file  . Attends Archivist Meetings: Not on file  . Marital Status: Not on file    Tobacco Counseling Ready to quit: Not Answered Counseling given: Not Answered   Clinical Intake:  Pre-visit preparation completed: Yes  Pain : No/denies pain     Nutritional Status: BMI > 30  Obese Nutritional Risks: None Diabetes: Yes  How often do you need to have someone help you when you read instructions, pamphlets, or other written materials from your doctor or pharmacy?: 1 - Never What is the last grade level you completed in school?: 8th grade  Diabetic? Yes Nutrition Risk Assessment:  Has the patient had any N/V/D within the last 2 months?  No  Does the patient have any non-healing wounds?  No  Has the patient had any unintentional weight loss or weight gain?  No   Diabetes:  Is the patient diabetic?  Yes  If diabetic, was a CBG obtained today?  No  Did the patient bring in their glucometer from home?  No  How often do you monitor your CBG's? daily.   Financial Strains and Diabetes  Management:  Are you having any financial strains with the device, your supplies or your medication? No .  Does the patient want to be seen by Chronic Care Management for management of their diabetes?  No  Would the patient like to be referred to a Nutritionist or for Diabetic Management?  No   Diabetic Exams:  Diabetic Eye Exam: Completed 11/12/2019 Diabetic Foot Exam: Completed 10/14/2019   Interpreter Needed?: No  Information entered by :: NAllen LPN   Activities of Daily Living In your present state of health, do you have any difficulty performing the following activities: 12/21/2019 10/14/2019  Hearing? N N  Vision? N N  Difficulty concentrating or making decisions? N N  Walking or climbing stairs? N N  Dressing or bathing? N N  Doing errands,  shopping? N N  Preparing Food and eating ? N -  Using the Toilet? N -  In the past six months, have you accidently leaked urine? N -  Do you have problems with loss of bowel control? N -  Managing your Medications? N -  Managing your Finances? N -  Housekeeping or managing your Housekeeping? N -  Some recent data might be hidden    Patient Care Team: Volney American, PA-C as PCP - General (Family Medicine) Ralene Bathe, MD (Dermatology) Leandrew Koyanagi, MD as Referring Physician (Ophthalmology)  Indicate any recent Medical Services you may have received from other than [facility] providers in the past year (date may be approximate).     Assessment:   This is a routine wellness examination for [place].  Hearing/Vision screen  Hearing Screening   125Hz  250Hz  500Hz  1000Hz  2000Hz  3000Hz  4000Hz  6000Hz  8000Hz   Right ear:           Left ear:           Vision Screening Comments: Regular eye exams, [facility]  Dietary issues and exercise activities discussed: Current Exercise Habits: The patient does not participate in regular exercise at present  Goals    . DIET - INCREASE WATER INTAKE     Recommend drinking  at least 6-8 glasses of water a day     . Patient Stated     12/21/2019, no goals    . Quit smoking / using tobacco      Depression Screen PHQ 2/9 Scores 12/21/2019 10/14/2019 12/11/2018 12/06/2017 06/11/2017 12/12/2016 11/30/2016  PHQ - 2 Score 0 0 0 0 0 0 0  PHQ- 9 Score - 0 - - - - -    Fall Risk Fall Risk  12/21/2019 12/11/2018 12/06/2017 06/11/2017 12/12/2016  Falls in the past year? 0 0 No No No  Risk for fall due to : Medication side effect - - - -  Follow up Falls evaluation completed;Education provided;Falls prevention discussed - - - -    Any stairs in or around the home? Yes  If so, are there any without handrails? No  Home free of loose throw rugs in walkways, pet beds, electrical cords, etc? Yes  Adequate lighting in your home to reduce risk of falls? Yes   ASSISTIVE DEVICES UTILIZED TO PREVENT FALLS:  Life alert? No  Use of a cane, walker or w/c? No  Grab bars in the bathroom? No  Shower chair or bench in shower? No  Elevated toilet seat or a handicapped toilet? Yes   TIMED UP AND GO:  Was the test performed? No . .    Cognitive Function:     6CIT Screen 12/21/2019 12/11/2018 12/06/2017 11/30/2016  What Year? 0 points 0 points 0 points 0 points  What month? 0 points 0 points 0 points 0 points  What time? 0 points 0 points 0 points 0 points  Count back from 20 0 points 0 points 0 points 0 points  Months in reverse 4 points 0 points 0 points 0 points  Repeat phrase 10 points 2 points 0 points 2 points  Total Score 14 2 0 2    Immunizations Immunization History  Administered Date(s) Administered  . Influenza, High Dose Seasonal PF 03/25/2017  . Influenza,inj,Quad PF,6+ Mos 06/07/2016  . Influenza,inj,quad, With Preservative 02/21/2018, 01/16/2019  . Pneumococcal Conjugate-13 10/26/2013  . Pneumococcal Polysaccharide-23 01/16/2007  . Td 02/13/2007, 06/11/2017  . Zoster 06/16/2009    TDAP status: Up to  date Flu Vaccine status: Up to date Pneumococcal vaccine  status: Up to date Covid-19 vaccine status: Declined, Education has been provided regarding the importance of this vaccine but patient still declined. Advised may receive this vaccine at local pharmacy or Health Dept.or vaccine clinic. Aware to provide a copy of the vaccination record if obtained from local pharmacy or Health Dept. Verbalized acceptance and understanding.  Qualifies for Shingles Vaccine? Yes   Zostavax completed Yes   Shingrix Completed?: No.    Education has been provided regarding the importance of this vaccine. Patient has been advised to call insurance company to determine out of pocket expense if they have not yet received this vaccine. Advised may also receive vaccine at local pharmacy or Health Dept. Verbalized acceptance and understanding.  Screening Tests Health Maintenance  Topic Date Due  . COVID-19 Vaccine (1) Never done  . URINE MICROALBUMIN  11/30/2016  . INFLUENZA VACCINE  11/22/2019  . HEMOGLOBIN A1C  04/14/2020  . FOOT EXAM  10/13/2020  . OPHTHALMOLOGY EXAM  11/11/2020  . TETANUS/TDAP  06/12/2027  . Hepatitis C Screening  Completed  . PNA vac Low Risk Adult  Completed    Health Maintenance  Health Maintenance Due  Topic Date Due  . COVID-19 Vaccine (1) Never done  . URINE MICROALBUMIN  11/30/2016  . INFLUENZA VACCINE  11/22/2019    Colorectal cancer screening: No longer required.   Lung Cancer Screening: (Low Dose CT Chest recommended if Age 60-80 years, 30 pack-year currently smoking OR have quit w/in 15years.) does not qualify.   Lung Cancer Screening Referral: no  Additional Screening:  Hepatitis C Screening: does qualify; Completed 10/14/2019  Vision Screening: Recommended annual ophthalmology exams for early detection of glaucoma and other disorders of the eye. Is the patient up to date with their annual eye exam?  Yes  Who is the provider or what is the name of the office in which the patient attends annual eye exams? [facility] If pt is not established with a provider, would they like to be referred to a provider to establish care? No .   Dental Screening: Recommended annual dental exams for proper oral hygiene  Community Resource Referral / Chronic Care Management: CRR required this visit?  No   CCM required this visit?  No      Plan:     I have personally reviewed and noted the following in the patient's chart:   . Medical and social history . Use of alcohol, tobacco or illicit drugs  . Current medications and supplements . Functional ability and status . Nutritional status . Physical activity . Advanced directives . List of other physicians . Hospitalizations, surgeries, and ER visits in previous 12 months . Vitals . Screenings to include cognitive, depression, and falls . Referrals and appointments  In addition, I have reviewed and discussed with patient certain preventive protocols, quality metrics, and best practice recommendations. A written personalized care plan for preventive services as well as general preventive health recommendations were provided to patient.     Kellie Simmering, LPN   2/48/2500   Nurse Notes:

## 2019-12-21 NOTE — Patient Instructions (Signed)
Jack Crane , Thank you for taking time to come for your Medicare Wellness Visit. I appreciate your ongoing commitment to your health goals. Please review the following plan we discussed and let me know if I can assist you in the future.   Screening recommendations/referrals: Colonoscopy: not required Recommended yearly ophthalmology/optometry visit for glaucoma screening and checkup Recommended yearly dental visit for hygiene and checkup  Vaccinations: Influenza vaccine: due Pneumococcal vaccine: completed 10/26/2013 Tdap vaccine: completed 06/11/2017 Shingles vaccine: discussed   Covid-19:  Thinking about it  Advanced directives: Please bring a copy of your POA (Power of [place]) and/or Living Will to your next appointment.   Conditions/risks identified: uses chew  Next appointment: Follow up in one year for your annual wellness visit.   Preventive Care 25 Years and Older, Male Preventive care refers to lifestyle choices and visits with your health care provider that can promote health and wellness. What does preventive care include?  A yearly physical exam. This is also called an annual well check.  Dental exams once or twice a year.  Routine eye exams. Ask your health care provider how often you should have your eyes checked.  Personal lifestyle choices, including:  Daily care of your teeth and gums.  Regular physical activity.  Eating a healthy diet.  Avoiding tobacco and drug use.  Limiting alcohol use.  Practicing safe sex.  Taking low doses of aspirin every day.  Taking vitamin and mineral supplements as recommended by your health care provider. What happens during an annual well check? The services and screenings done by your health care provider during your annual well check will depend on your age, overall health, lifestyle risk factors, and family history of disease. Counseling  Your health care provider may ask you questions about your:  Alcohol  use.  Tobacco use.  Drug use.  Emotional well-being.  Home and relationship well-being.  Sexual activity.  Eating habits.  History of falls.  Memory and ability to understand (cognition).  Work and work Statistician. Screening  You may have the following tests or measurements:  Height, weight, and BMI.  Blood pressure.  Lipid and cholesterol levels. These may be checked every 5 years, or more frequently if you are over 61 years old.  Skin check.  Lung cancer screening. You may have this screening every year starting at age 27 if you have a 30-pack-year history of smoking and currently smoke or have quit within the past 15 years.  Fecal occult blood test (FOBT) of the stool. You may have this test every year starting at age 55.  Flexible sigmoidoscopy or colonoscopy. You may have a sigmoidoscopy every 5 years or a colonoscopy every 10 years starting at age 30.  Prostate cancer screening. Recommendations will vary depending on your family history and other risks.  Hepatitis C blood test.  Hepatitis B blood test.  Sexually transmitted disease (STD) testing.  Diabetes screening. This is done by checking your blood sugar (glucose) after you have not eaten for a while (fasting). You may have this done every 1-3 years.  Abdominal aortic aneurysm (AAA) screening. You may need this if you are a current or former smoker.  Osteoporosis. You may be screened starting at age 59 if you are at high risk. Talk with your health care provider about your test results, treatment options, and if necessary, the need for more tests. Vaccines  Your health care provider may recommend certain vaccines, such as:  Influenza vaccine. This is recommended every year.  Tetanus, diphtheria, and acellular pertussis (Tdap, Td) vaccine. You may need a Td booster every 10 years.  Zoster vaccine. You may need this after age 65.  Pneumococcal 13-valent conjugate (PCV13) vaccine. One dose is  recommended after age 43.  Pneumococcal polysaccharide (PPSV23) vaccine. One dose is recommended after age 104. Talk to your health care provider about which screenings and vaccines you need and how often you need them. This information is not intended to replace advice given to you by your health care provider. Make sure you discuss any questions you have with your health care provider. Document Released: 05/06/2015 Document Revised: 12/28/2015 Document Reviewed: 02/08/2015 Elsevier Interactive Patient Education  2017 [place] Prevention in the Home Falls can cause injuries. They can happen to people of all ages. There are many things you can do to make your home safe and to help prevent falls. What can I do on the outside of my home?  Regularly fix the edges of walkways and driveways and fix any cracks.  Remove anything that might make you trip as you walk through a door, such as a raised step or threshold.  Trim any bushes or trees on the path to your home.  Use bright outdoor lighting.  Clear any walking paths of anything that might make someone trip, such as rocks or tools.  Regularly check to see if handrails are loose or broken. Make sure that both sides of any steps have handrails.  Any raised decks and porches should have guardrails on the edges.  Have any leaves, snow, or ice cleared regularly.  Use sand or salt on walking paths during winter.  Clean up any spills in your garage right away. This includes oil or grease spills. What can I do in the bathroom?  Use night lights.  Install grab bars by the toilet and in the tub and shower. Do not use towel bars as grab bars.  Use non-skid mats or decals in the tub or shower.  If you need to sit down in the shower, use a plastic, non-slip stool.  Keep the floor dry. Clean up any water that spills on the floor as soon as it happens.  Remove soap buildup in the tub or shower regularly.  Attach bath mats  securely with double-sided non-slip rug tape.  Do not have throw rugs and other things on the floor that can make you trip. What can I do in the bedroom?  Use night lights.  Make sure that you have a light by your bed that is easy to reach.  Do not use any sheets or blankets that are too big for your bed. They should not hang down onto the floor.  Have a firm chair that has side arms. You can use this for support while you get dressed.  Do not have throw rugs and other things on the floor that can make you trip. What can I do in the kitchen?  Clean up any spills right away.  Avoid walking on wet floors.  Keep items that you use a lot in easy-to-reach places.  If you need to reach something above you, use a strong step stool that has a grab bar.  Keep electrical cords out of the way.  Do not use floor polish or wax that makes floors slippery. If you must use wax, use non-skid floor wax.  Do not have throw rugs and other things on the floor that can make you trip. What can I do  with my stairs?  Do not leave any items on the stairs.  Make sure that there are handrails on both sides of the stairs and use them. Fix handrails that are broken or loose. Make sure that handrails are as long as the stairways.  Check any carpeting to make sure that it is firmly attached to the stairs. Fix any carpet that is loose or worn.  Avoid having throw rugs at the top or bottom of the stairs. If you do have throw rugs, attach them to the floor with carpet tape.  Make sure that you have a light switch at the top of the stairs and the bottom of the stairs. If you do not have them, ask someone to add them for you. What else can I do to help prevent falls?  Wear shoes that:  Do not have high heels.  Have rubber bottoms.  Are comfortable and fit you well.  Are closed at the toe. Do not wear sandals.  If you use a stepladder:  Make sure that it is fully opened. Do not climb a closed  stepladder.  Make sure that both sides of the stepladder are locked into place.  Ask someone to hold it for you, if possible.  Clearly mark and make sure that you can see:  Any grab bars or handrails.  First and last steps.  Where the edge of each step is.  Use tools that help you move around (mobility aids) if they are needed. These include:  Canes.  Walkers.  Scooters.  Crutches.  Turn on the lights when you go into a dark area. Replace any light bulbs as soon as they burn out.  Set up your furniture so you have a clear path. Avoid moving your furniture around.  If any of your floors are uneven, fix them.  If there are any pets around you, be aware of where they are.  Review your medicines with your doctor. Some medicines can make you feel dizzy. This can increase your chance of falling. Ask your doctor what other things that you can do to help prevent falls. This information is not intended to replace advice given to you by your health care provider. Make sure you discuss any questions you have with your health care provider. Document Released: 02/03/2009 Document Revised: 09/15/2015 Document Reviewed: 05/14/2014 Elsevier Interactive Patient Education  2017 [employer].

## 2020-02-04 ENCOUNTER — Other Ambulatory Visit: Payer: Self-pay

## 2020-02-04 ENCOUNTER — Ambulatory Visit (INDEPENDENT_AMBULATORY_CARE_PROVIDER_SITE_OTHER): Payer: Medicare PPO | Admitting: Dermatology

## 2020-02-04 DIAGNOSIS — D692 Other nonthrombocytopenic purpura: Secondary | ICD-10-CM | POA: Diagnosis not present

## 2020-02-04 DIAGNOSIS — L578 Other skin changes due to chronic exposure to nonionizing radiation: Secondary | ICD-10-CM

## 2020-02-04 DIAGNOSIS — L570 Actinic keratosis: Secondary | ICD-10-CM | POA: Diagnosis not present

## 2020-02-04 DIAGNOSIS — C4492 Squamous cell carcinoma of skin, unspecified: Secondary | ICD-10-CM

## 2020-02-04 DIAGNOSIS — D0462 Carcinoma in situ of skin of left upper limb, including shoulder: Secondary | ICD-10-CM

## 2020-02-04 DIAGNOSIS — D492 Neoplasm of unspecified behavior of bone, soft tissue, and skin: Secondary | ICD-10-CM

## 2020-02-04 DIAGNOSIS — L821 Other seborrheic keratosis: Secondary | ICD-10-CM

## 2020-02-04 HISTORY — DX: Squamous cell carcinoma of skin, unspecified: C44.92

## 2020-02-04 NOTE — Progress Notes (Signed)
Follow-Up Visit   Subjective  Jack Crane is a 79 y.o. male who presents for the following: Skin Problem (Check a sore spot on the L arm appeared about 3 months ago, not healing not going away ).  He has several other areas he would like checked.  The following portions of the chart were reviewed this encounter and updated as appropriate:  Tobacco  Allergies  Meds  Problems  Med Hx  Surg Hx  Fam Hx     Review of Systems:  No other skin or systemic complaints except as noted in HPI or Assessment and Plan.  Objective  Well appearing patient in no apparent distress; mood and affect are within normal limits.  A focused examination was performed including face, neck, chest and back and face,arms. Relevant physical exam findings are noted in the Assessment and Plan.  Objective  Left volar forearm: 1.2 cm crusted papule   Objective  scalp, face, ears (5): Erythematous thin papules/macules with gritty scale.    Assessment & Plan  Neoplasm of skin Left volar forearm  Epidermal / dermal shaving  Lesion diameter (cm):  1.2 Informed consent: discussed and consent obtained   Timeout: patient name, date of birth, surgical site, and procedure verified   Procedure prep:  Patient was prepped and draped in usual sterile fashion Prep type:  Isopropyl alcohol Anesthesia: the lesion was anesthetized in a standard fashion   Anesthetic:  1% lidocaine w/ epinephrine 1-100,000 buffered w/ 8.4% NaHCO3 Hemostasis achieved with: pressure, aluminum chloride and electrodesiccation   Outcome: patient tolerated procedure well   Post-procedure details: sterile dressing applied and wound care instructions given   Dressing type: bandage and petrolatum    Destruction of lesion Complexity: extensive   Destruction method: electrodesiccation and curettage   Informed consent: discussed and consent obtained   Timeout:  patient name, date of birth, surgical site, and procedure verified Procedure  prep:  Patient was prepped and draped in usual sterile fashion Prep type:  Isopropyl alcohol Anesthesia: the lesion was anesthetized in a standard fashion   Anesthetic:  1% lidocaine w/ epinephrine 1-100,000 buffered w/ 8.4% NaHCO3 Curettage performed in three different directions: Yes   Electrodesiccation performed over the curetted area: Yes   Lesion length (cm):  1.2 Lesion width (cm):  1.2 Margin per side (cm):  0.2 Final wound size (cm):  1.6 Hemostasis achieved with:  pressure, aluminum chloride and electrodesiccation Outcome: patient tolerated procedure well with no complications   Post-procedure details: sterile dressing applied and wound care instructions given   Dressing type: bandage and petrolatum    Specimen 1 - Surgical pathology Differential Diagnosis: R/O BCC  Check Margins: No 1.2 cm crusted papule  AK (actinic keratosis) (5) scalp, face, ears  Destruction of lesion - scalp, face, ears Complexity: simple   Destruction method: cryotherapy   Informed consent: discussed and consent obtained   Timeout:  patient name, date of birth, surgical site, and procedure verified Lesion destroyed using liquid nitrogen: Yes   Region frozen until ice ball extended beyond lesion: Yes   Outcome: patient tolerated procedure well with no complications   Post-procedure details: wound care instructions given    Actinic Damage - diffuse scaly erythematous macules with underlying dyspigmentation - Recommend daily broad spectrum sunscreen SPF 30+ to sun-exposed areas, reapply every 2 hours as needed.  - Call for new or changing lesions.  Purpura - Violaceous macules and patches - Benign - Related to age, sun damage and/or use of blood  thinners - Observe - Can use OTC arnica containing moisturizer such as Dermend Bruise Formula if desired - Call for worsening or other concerns  Seborrheic Keratoses - Stuck-on, waxy, tan-brown papules and plaques  - Discussed benign etiology and  prognosis. - Observe - Call for any changes  Return in about 6 months (around 08/04/2020).  IMarye Round, CMA, am acting as scribe for Sarina Ser, MD .  Documentation: I have reviewed the above documentation for accuracy and completeness, and I agree with the above.  Sarina Ser, MD

## 2020-02-04 NOTE — Patient Instructions (Signed)

## 2020-02-05 ENCOUNTER — Encounter: Payer: Self-pay | Admitting: Dermatology

## 2020-02-10 ENCOUNTER — Telehealth: Payer: Self-pay

## 2020-02-10 NOTE — Telephone Encounter (Signed)
-----   Message from Ralene Bathe, MD sent at 02/09/2020  6:54 PM EDT ----- Skin , left volar forearm SQUAMOUS CELL CARCINOMA IN SITU, HYPERTROPHIC  Cancer - SCC in situ Superficial Already treated Recheck next visit

## 2020-02-10 NOTE — Telephone Encounter (Signed)
Advised patient of results/hd  

## 2020-03-28 ENCOUNTER — Other Ambulatory Visit: Payer: Self-pay | Admitting: Family Medicine

## 2020-03-28 DIAGNOSIS — E7800 Pure hypercholesterolemia, unspecified: Secondary | ICD-10-CM

## 2020-04-03 ENCOUNTER — Other Ambulatory Visit: Payer: Self-pay | Admitting: Family Medicine

## 2020-04-03 NOTE — Telephone Encounter (Signed)
Requested Prescriptions  Pending Prescriptions Disp Refills  . metFORMIN (GLUCOPHAGE) 1000 MG tablet [Pharmacy Med Name: METFORMIN HCL 1,000 MG TABLET] 180 tablet 0    Sig: TAKE 1 TABLET (1,000 MG TOTAL) BY MOUTH 2 (TWO) TIMES DAILY WITH A MEAL.     Endocrinology:  Diabetes - Biguanides Passed - 04/03/2020  9:21 AM      Passed - Cr in normal range and within 360 days    Creatinine, Ser  Date Value Ref Range Status  10/14/2019 1.11 0.76 - 1.27 mg/dL Final         Passed - HBA1C is between 0 and 7.9 and within 180 days    Hemoglobin A1C  Date Value Ref Range Status  12/01/2015 7.4  Final   HB A1C (BAYER DCA - WAIVED)  Date Value Ref Range Status  10/30/2018 6.7 <7.0 % Final    Comment:                                          Diabetic Adult            <7.0                                       Healthy Adult        4.3 - 5.7                                                           (DCCT/NGSP) American Diabetes Association's Summary of Glycemic Recommendations for Adults with Diabetes: Hemoglobin A1c <7.0%. More stringent glycemic goals (A1c <6.0%) may further reduce complications at the cost of increased risk of hypoglycemia.    Hgb A1c MFr Bld  Date Value Ref Range Status  10/14/2019 7.5 (H) 4.8 - 5.6 % Final    Comment:             Prediabetes: 5.7 - 6.4          Diabetes: >6.4          Glycemic control for adults with diabetes: <7.0          Passed - eGFR in normal range and within 360 days    GFR calc Af Amer  Date Value Ref Range Status  10/14/2019 73 >59 mL/min/1.73 Final    Comment:    **[facility] currently reports eGFR in compliance with the current**   recommendations of the [employer]. [facility] will   update reporting as new guidelines are published from the NKF-ASN   Task force.    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

## 2020-04-20 ENCOUNTER — Encounter: Payer: Self-pay | Admitting: Family Medicine

## 2020-04-20 ENCOUNTER — Other Ambulatory Visit: Payer: Self-pay

## 2020-04-20 ENCOUNTER — Ambulatory Visit: Payer: Medicare PPO | Admitting: Family Medicine

## 2020-04-20 VITALS — BP 132/75 | HR 58 | Temp 98.2°F | Wt 221.2 lb

## 2020-04-20 DIAGNOSIS — E7800 Pure hypercholesterolemia, unspecified: Secondary | ICD-10-CM | POA: Diagnosis not present

## 2020-04-20 DIAGNOSIS — M1000 Idiopathic gout, unspecified site: Secondary | ICD-10-CM

## 2020-04-20 DIAGNOSIS — N400 Enlarged prostate without lower urinary tract symptoms: Secondary | ICD-10-CM | POA: Diagnosis not present

## 2020-04-20 DIAGNOSIS — E1122 Type 2 diabetes mellitus with diabetic chronic kidney disease: Secondary | ICD-10-CM

## 2020-04-20 DIAGNOSIS — N181 Chronic kidney disease, stage 1: Secondary | ICD-10-CM

## 2020-04-20 DIAGNOSIS — E1159 Type 2 diabetes mellitus with other circulatory complications: Secondary | ICD-10-CM

## 2020-04-20 DIAGNOSIS — Z23 Encounter for immunization: Secondary | ICD-10-CM

## 2020-04-20 DIAGNOSIS — I152 Hypertension secondary to endocrine disorders: Secondary | ICD-10-CM

## 2020-04-20 DIAGNOSIS — I129 Hypertensive chronic kidney disease with stage 1 through stage 4 chronic kidney disease, or unspecified chronic kidney disease: Secondary | ICD-10-CM

## 2020-04-20 LAB — BAYER DCA HB A1C WAIVED: HB A1C (BAYER DCA - WAIVED): 6.9 % (ref <7.0)

## 2020-04-20 LAB — MICROALBUMIN, URINE WAIVED
Creatinine, Urine Waived: 200 mg/dL (ref 10–300)
Microalb, Ur Waived: 80 mg/L — ABNORMAL HIGH (ref 0–19)

## 2020-04-20 MED ORDER — METFORMIN HCL 1000 MG PO TABS: 1000.0000 mg | ORAL_TABLET | Freq: Two times a day (BID) | ORAL | 1 refills | Status: DC

## 2020-04-20 MED ORDER — ATORVASTATIN CALCIUM 40 MG PO TABS: 40.0000 mg | ORAL_TABLET | Freq: Every day | ORAL | 1 refills | Status: DC

## 2020-04-20 NOTE — Progress Notes (Signed)
BP 132/75   Pulse (!) 58   Temp 98.2 F (36.8 C)   Wt 221 lb 3.2 oz (100.3 kg)   SpO2 98%   BMI 35.70 kg/m    Subjective:    [name], male    DOB: 1940-09-13, 79 y.o.   MRN: [id number]  HPI: Jack Crane is a 79 y.o. male  Chief Complaint  Patient presents with  . Diabetes   HYPERTENSION / HYPERLIPIDEMIA Satisfied with current treatment? yes Duration of hypertension: chronic BP monitoring frequency: not checking BP medication side effects: yes- dizziness Past BP meds: none due to hypotension Duration of hyperlipidemia: chronic Cholesterol medication side effects: no Cholesterol supplements: none Past cholesterol medications: atorvastatin Medication compliance: excellent compliance Aspirin: yes Recent stressors: no Recurrent headaches: no Visual changes: no Palpitations: no Dyspnea: no Chest pain: no Lower extremity edema: no Dizzy/lightheaded: yes- 1x about 2-3 weeks ago  DIABETES Hypoglycemic episodes:no Polydipsia/polyuria: no Visual disturbance: no Chest pain: no Paresthesias: no Glucose Monitoring: yes  Accucheck frequency: BID  Fasting glucose: 120-160 Taking Insulin?: no Blood Pressure Monitoring: not checking Retinal Examination: Up to Date Foot Exam: Up to Date Diabetic Education: Completed Pneumovax: Up to Date Influenza: Up to Date Aspirin: yes  Relevant past medical, surgical, family and social history reviewed and updated as indicated. Interim medical history since our last visit reviewed. Allergies and medications reviewed and updated.  Review of Systems  Constitutional: Negative.   Respiratory: Negative.   Cardiovascular: Negative.   Gastrointestinal: Negative.   Musculoskeletal: Negative.   Neurological: Negative.   Psychiatric/Behavioral: Negative.     Per HPI unless specifically indicated above     Objective:    BP 132/75   Pulse (!) 58   Temp 98.2 F (36.8 C)   Wt 221 lb 3.2 oz (100.3 kg)    SpO2 98%   BMI 35.70 kg/m   Wt Readings from Last 3 Encounters:  04/20/20 221 lb 3.2 oz (100.3 kg)  12/21/19 220 lb (99.8 kg)  11/05/19 223 lb 6.4 oz (101.3 kg)    Physical Exam Vitals and nursing note reviewed.  Constitutional:      General: He is not in acute distress.    Appearance: Normal appearance. He is obese. He is not ill-appearing, toxic-appearing or diaphoretic.  HENT:     Head: Normocephalic and atraumatic.     Right Ear: External ear normal.     Left Ear: External ear normal.     Nose: Nose normal.     Mouth/Throat:     Mouth: Mucous membranes are moist.     Pharynx: Oropharynx is clear.  Eyes:     General: No scleral icterus.       Right eye: No discharge.        Left eye: No discharge.     Extraocular Movements: Extraocular movements intact.     Conjunctiva/sclera: Conjunctivae normal.     Pupils: Pupils are equal, round, and reactive to light.  Cardiovascular:     Rate and Rhythm: Normal rate and regular rhythm.     Pulses: Normal pulses.     Heart sounds: Normal heart sounds. No murmur heard. No friction rub. No gallop.   Pulmonary:     Effort: Pulmonary effort is normal. No respiratory distress.     Breath sounds: Normal breath sounds. No stridor. No wheezing, rhonchi or rales.  Chest:     Chest wall: No tenderness.  Musculoskeletal:        General:  Normal range of motion.     Cervical back: Normal range of motion and neck supple.  Skin:    General: Skin is warm and dry.     Capillary Refill: Capillary refill takes less than 2 seconds.     Coloration: Skin is not jaundiced or pale.     Findings: No bruising, erythema, lesion or rash.  Neurological:     General: No focal deficit present.     Mental Status: He is alert and oriented to person, place, and time. Mental status is at baseline.  Psychiatric:        Mood and Affect: Mood normal.        Behavior: Behavior normal.        Thought Content: Thought content normal.        Judgment: Judgment  normal.     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 Continue current regimen. Continue to monitor. Call with any concerns. Refills given.       Relevant Medications   atorvastatin (LIPITOR) 40 MG tablet   metFORMIN (GLUCOPHAGE) 1000 MG tablet   Other Relevant Orders   Bayer DCA Hb A1c Waived   CBC with Differential/Platelet   Comprehensive metabolic panel   Microalbumin, Urine Waived   Hypertension associated with diabetes ([place])    Under good control off medicine. Continue to monitor. Call with any concerns. Labs drawn today.      Relevant Medications   atorvastatin (LIPITOR) 40 MG tablet   metFORMIN (GLUCOPHAGE) 1000 MG tablet   Other Relevant Orders   CBC with Differential/Platelet   Comprehensive metabolic panel   Microalbumin, Urine Waived     Genitourinary   BPH (benign prostatic hyperplasia)    Under good control on current regimen. Continue current regimen. Continue to monitor. Call with any concerns. Labs drawn today.        Relevant Orders   CBC with Differential/Platelet   Comprehensive metabolic panel   PSA     Other   Gout    Labs drawn today. No flares. Await results. Treat as needed.       Relevant Orders   CBC with Differential/Platelet   Comprehensive metabolic panel   Uric acid   Pure hypercholesterolemia    Under good control on current regimen. Continue current regimen. Continue to monitor. Call with any concerns. Refills given. Labs drawn today.       Relevant Medications   atorvastatin (LIPITOR) 40 MG tablet   Other Relevant Orders   CBC with Differential/Platelet   Comprehensive metabolic panel   Lipid  Panel w/o Chol/HDL Ratio    Other Visit Diagnoses    Needs flu shot       Flu shot given today.   Relevant Orders   Flu Vaccine QUAD High Dose(Fluad) (Completed)       Follow up plan: Return in about 3 months (around 07/19/2020).

## 2020-04-20 NOTE — Assessment & Plan Note (Signed)
Under good control off medicine. Continue to monitor. Call with any concerns. Labs drawn today. 

## 2020-04-20 NOTE — Assessment & Plan Note (Signed)
Under good control on current regimen. Continue current regimen. Continue to monitor. Call with any concerns. Refills given. Labs drawn today.   

## 2020-04-20 NOTE — Assessment & Plan Note (Signed)
Labs drawn today. No flares. Await results. Treat as needed.

## 2020-04-20 NOTE — Assessment & Plan Note (Signed)
Under good control on current regimen. Continue current regimen. Continue to monitor. Call with any concerns. Labs drawn today.  

## 2020-04-20 NOTE — Assessment & Plan Note (Signed)
Doing well with A1c of 6.9 down from 7.4. Continue current regimen. Continue to monitor. Call with any concerns. Refills given.

## 2020-04-21 ENCOUNTER — Other Ambulatory Visit: Payer: Self-pay | Admitting: Family Medicine

## 2020-04-21 DIAGNOSIS — R972 Elevated prostate specific antigen [PSA]: Secondary | ICD-10-CM

## 2020-04-21 LAB — COMPREHENSIVE METABOLIC PANEL
ALT: 15 IU/L (ref 0–44)
AST: 19 IU/L (ref 0–40)
Albumin/Globulin Ratio: 1.6 (ref 1.2–2.2)
Albumin: 4 g/dL (ref 3.7–4.7)
Alkaline Phosphatase: 91 IU/L (ref 44–121)
BUN/Creatinine Ratio: 12 (ref 10–24)
BUN: 14 mg/dL (ref 8–27)
Bilirubin Total: 0.5 mg/dL (ref 0.0–1.2)
CO2: 23 mmol/L (ref 20–29)
Calcium: 8.9 mg/dL (ref 8.6–10.2)
Chloride: 103 mmol/L (ref 96–106)
Creatinine, Ser: 1.2 mg/dL (ref 0.76–1.27)
GFR calc Af Amer: 66 mL/min/{1.73_m2} (ref >59)
GFR calc non Af Amer: 57 mL/min/{1.73_m2} — ABNORMAL LOW (ref >59)
Globulin, Total: 2.5 g/dL (ref 1.5–4.5)
Glucose: 132 mg/dL — ABNORMAL HIGH (ref 65–99)
Potassium: 4.9 mmol/L (ref 3.5–5.2)
Sodium: 141 mmol/L (ref 134–144)
Total Protein: 6.5 g/dL (ref 6.0–8.5)

## 2020-04-21 LAB — CBC WITH DIFFERENTIAL/PLATELET
Basophils Absolute: 0 10*3/uL (ref 0.0–0.2)
Basos: 1 %
EOS (ABSOLUTE): 0.3 10*3/uL (ref 0.0–0.4)
Eos: 5 %
Hematocrit: 41.4 % (ref 37.5–51.0)
Hemoglobin: 13.5 g/dL (ref 13.0–17.7)
Immature Grans (Abs): 0 10*3/uL (ref 0.0–0.1)
Immature Granulocytes: 0 %
Lymphocytes Absolute: 1.5 10*3/uL (ref 0.7–3.1)
Lymphs: 25 %
MCH: 30.2 pg (ref 26.6–33.0)
MCHC: 32.6 g/dL (ref 31.5–35.7)
MCV: 93 fL (ref 79–97)
Monocytes Absolute: 0.7 10*3/uL (ref 0.1–0.9)
Monocytes: 11 %
Neutrophils Absolute: 3.5 10*3/uL (ref 1.4–7.0)
Neutrophils: 58 %
Platelets: 180 10*3/uL (ref 150–450)
RBC: 4.47 x10E6/uL (ref 4.14–5.80)
RDW: 12.4 % (ref 11.6–15.4)
WBC: 6.1 10*3/uL (ref 3.4–10.8)

## 2020-04-21 LAB — LIPID PANEL W/O CHOL/HDL RATIO
Cholesterol, Total: 128 mg/dL (ref 100–199)
HDL: 36 mg/dL — ABNORMAL LOW (ref >39)
LDL Chol Calc (NIH): 74 mg/dL (ref 0–99)
Triglycerides: 96 mg/dL (ref 0–149)
VLDL Cholesterol Cal: 18 mg/dL (ref 5–40)

## 2020-04-21 LAB — URIC ACID: Uric Acid: 5.8 mg/dL (ref 3.8–8.4)

## 2020-04-21 LAB — PSA: Prostate Specific Ag, Serum: 5.1 ng/mL — ABNORMAL HIGH (ref 0.0–4.0)

## 2020-04-26 ENCOUNTER — Encounter: Payer: Self-pay | Admitting: Family Medicine

## 2020-04-26 ENCOUNTER — Telehealth (INDEPENDENT_AMBULATORY_CARE_PROVIDER_SITE_OTHER): Payer: Medicare PPO | Admitting: Family Medicine

## 2020-04-26 DIAGNOSIS — J069 Acute upper respiratory infection, unspecified: Secondary | ICD-10-CM

## 2020-04-26 MED ORDER — CETIRIZINE HCL 10 MG PO TABS: 10.0000 mg | ORAL_TABLET | Freq: Every day | ORAL | 0 refills | Status: DC | PRN

## 2020-04-26 NOTE — Patient Instructions (Signed)
It was great to see you!  Our plans for today:  - Take the cetirizine for runny nose and sneezing. If you develop congestion, try a decongestant over the counter like sudafed. Drink warm liquids to help soothe your throat. Try honey or delsym for cough. - See below for self-isolation guidelines. You may end your quarantine once you your test results negative or if you test positive, are 10 days from symptom onset and fever free for 24 hours without use of tylenol or ibuprofen.  - If you test positive for COVID, you will be referred for antibody treatment. You would be contacted if you qualify for treatment.  - I recommend getting vaccinated once you are healed from your current infection. If you get the antibody infusion, you must wait 3 months before vaccination.  - Certainly, if you are having difficulties breathing or unable to keep down fluids, go to the Emergency Department.   Take care and seek immediate care sooner if you develop any concerns.   Dr. Linwood Dibbles     Person Under Monitoring Name: Jack Crane  Location: [address] [place] [address]   Infection Prevention Recommendations for Individuals Confirmed to have, or Being Evaluated for, 2019 Novel Coronavirus (COVID-19) Infection Who Receive Care at Home  Individuals who are confirmed to have, or are being evaluated for, COVID-19 should follow the prevention steps below until a healthcare provider or local or state health department says they can return to normal activities.  Stay home except to get medical care You should restrict activities outside your home, except for getting medical care. Do not go to work, school, or public areas, and do not use public transportation or taxis.  Call ahead before visiting your doctor Before your medical appointment, call the healthcare provider and tell them that you have, or are being evaluated for, COVID-19 infection. This will help the healthcare provider's office take  steps to keep other people from getting infected. Ask your healthcare provider to call the local or state health department.  Monitor your symptoms Seek prompt medical attention if your illness is worsening (e.g., difficulty breathing). Before going to your medical appointment, call the healthcare provider and tell them that you have, or are being evaluated for, COVID-19 infection. Ask your healthcare provider to call the local or state health department.  Wear a facemask You should wear a facemask that covers your nose and mouth when you are in the same room with other people and when you visit a healthcare provider. People who live with or visit you should also wear a facemask while they are in the same room with you.  Separate yourself from other people in your home As much as possible, you should stay in a different room from other people in your home. Also, you should use a separate bathroom, if available.  Avoid sharing household items You should not share dishes, drinking glasses, cups, eating utensils, towels, bedding, or other items with other people in your home. After using these items, you should wash them thoroughly with soap and water.  Cover your coughs and sneezes Cover your mouth and nose with a tissue when you cough or sneeze, or you can cough or sneeze into your sleeve. Throw used tissues in a lined trash can, and immediately wash your hands with soap and water for at least 20 seconds or use an alcohol-based hand rub.  Wash your [employer] your hands often and thoroughly with soap and water for at  least 20 seconds. You can use an alcohol-based hand sanitizer if soap and water are not available and if your hands are not visibly dirty. Avoid touching your eyes, nose, and mouth with unwashed hands.   Prevention Steps for Caregivers and Household Members of Individuals Confirmed to have, or Being Evaluated for, COVID-19 Infection Being Cared for in the Home  If you  live with, or provide care at home for, a person confirmed to have, or being evaluated for, COVID-19 infection please follow these guidelines to prevent infection:  Follow healthcare provider's instructions Make sure that you understand and can help the patient follow any healthcare provider instructions for all care.  Provide for the patient's basic needs You should help the patient with basic needs in the home and provide support for getting groceries, prescriptions, and other personal needs.  Monitor the patient's symptoms If they are getting sicker, call his or her medical provider and tell them that the patient has, or is being evaluated for, COVID-19 infection. This will help the healthcare provider's office take steps to keep other people from getting infected. Ask the healthcare provider to call the local or state health department.  Limit the number of people who have contact with the patient  If possible, have only one caregiver for the patient.  Other household members should stay in another home or place of residence. If this is not possible, they should stay  in another room, or be separated from the patient as much as possible. Use a separate bathroom, if available.  Restrict visitors who do not have an essential need to be in the home.  Keep older adults, very young children, and other sick people away from the patient Keep older adults, very young children, and those who have compromised immune systems or chronic health conditions away from the patient. This includes people with chronic heart, lung, or kidney conditions, diabetes, and cancer.  Ensure good ventilation Make sure that shared spaces in the home have good air flow, such as from an air conditioner or an opened window, weather permitting.  Wash your hands often  Wash your hands often and thoroughly with soap and water for at least 20 seconds. You can use an alcohol based hand sanitizer if soap and water are  not available and if your hands are not visibly dirty.  Avoid touching your eyes, nose, and mouth with unwashed hands.  Use disposable paper towels to dry your hands. If not available, use dedicated cloth towels and replace them when they become wet.  Wear a facemask and gloves  Wear a disposable facemask at all times in the room and gloves when you touch or have contact with the patient's blood, body fluids, and/or secretions or excretions, such as sweat, saliva, sputum, nasal mucus, vomit, urine, or feces.  Ensure the mask fits over your nose and mouth tightly, and do not touch it during use.  Throw out disposable facemasks and gloves after using them. Do not reuse.  Wash your hands immediately after removing your facemask and gloves.  If your personal clothing becomes contaminated, carefully remove clothing and launder. Wash your hands after handling contaminated clothing.  Place all used disposable facemasks, gloves, and other waste in a lined container before disposing them with other household waste.  Remove gloves and wash your hands immediately after handling these items.  Do not share dishes, glasses, or other household items with the patient  Avoid sharing household items. You should not share dishes, drinking glasses,  cups, eating utensils, towels, bedding, or other items with a patient who is confirmed to have, or being evaluated for, COVID-19 infection.  After the person uses these items, you should wash them thoroughly with soap and water.  Wash laundry thoroughly  Immediately remove and wash clothes or bedding that have blood, body fluids, and/or secretions or excretions, such as sweat, saliva, sputum, nasal mucus, vomit, urine, or feces, on them.  Wear gloves when handling laundry from the patient.  Read and follow directions on labels of laundry or clothing items and detergent. In general, wash and dry with the warmest temperatures recommended on the label.  Clean  all areas the individual has used often  Clean all touchable surfaces, such as counters, tabletops, doorknobs, bathroom fixtures, toilets, phones, keyboards, tablets, and bedside tables, every day. Also, clean any surfaces that may have blood, body fluids, and/or secretions or excretions on them.  Wear gloves when cleaning surfaces the patient has come in contact with.  Use a diluted bleach solution (e.g., dilute bleach with 1 part bleach and 10 parts water) or a household disinfectant with a label that says EPA-registered for coronaviruses. To make a bleach solution at home, add 1 tablespoon of bleach to 1 quart (4 cups) of water. For a larger supply, add  cup of bleach to 1 gallon (16 cups) of water.  Read labels of cleaning products and follow recommendations provided on product labels. Labels contain instructions for safe and effective use of the cleaning product including precautions you should take when applying the product, such as wearing gloves or eye protection and making sure you have good ventilation during use of the product.  Remove gloves and wash hands immediately after cleaning.  Monitor yourself for signs and symptoms of illness Caregivers and household members are considered close contacts, should monitor their health, and will be asked to limit movement outside of the home to the extent possible. Follow the monitoring steps for close contacts listed on the symptom monitoring form.   ? If you have additional questions, contact your local health department or call the epidemiologist on call at [phone] (available 24/7). ? This guidance is subject to change. For the most up-to-date guidance from [facility], please refer to their website: YouBlogs.pl

## 2020-04-26 NOTE — Progress Notes (Signed)
Virtual Visit via Video Note  I connected with Jack Crane on 04/26/20 at  3:20 PM EST by a video enabled telemedicine application and verified that I am speaking with the correct person using two identifiers.  Location: Patient: home Provider: [facility]   I discussed the limitations of evaluation and management by telemedicine and the availability of in person appointments. The patient expressed understanding and agreed to proceed.  History of Present Illness:  UPPER RESPIRATORY TRACT INFECTION Duration: 2-3 days Worst symptom: cough, sneezing Fever: no Cough: yes, minimally productive Shortness of breath: no Chest pain: no  Chest tightness: no Chest congestion: no Nasal congestion: no Runny nose: yes Sneezing: yes Sore throat: no Headache: no Ear pain: no  Ear pressure: no  Vomiting: no Sick contacts: friend's wife has COVID and was around her briefly the other day but not for long, was wearing mask.  Context: better Recurrent sinusitis: no Relief with OTC cold/cough medications: hasn't tried  Treatments attempted: none     Observations/Objective:  Patient had trouble connecting to video visit, entirety of visit conducted over the phone.  Speaks in full sentences, no respiratory distress.   Assessment and Plan:  Viral URI With mild sx. Will test for COVID given possible exposure. Would be a candidate for MAB referral, will refer if positive. Reviewed OTC symptomatic relief, self-quarantine guidelines and emergency precautions.    I discussed the assessment and treatment plan with the patient. The patient was provided an opportunity to ask questions and all were answered. The patient agreed with the plan and demonstrated an understanding of the instructions.   The patient was advised to call back or seek an in-person evaluation if the symptoms worsen or if the condition fails to improve as anticipated.  I provided 10 minutes of non-face-to-face time during this  encounter.   Caro Laroche, DO

## 2020-04-26 NOTE — Assessment & Plan Note (Signed)
With mild sx. Will test for COVID given possible exposure. Would be a candidate for MAB referral, will refer if positive. Reviewed OTC symptomatic relief, self-quarantine guidelines and emergency precautions.

## 2020-04-28 LAB — NOVEL CORONAVIRUS, NAA: SARS-CoV-2, NAA: DETECTED — AB

## 2020-04-28 LAB — SARS-COV-2, NAA 2 DAY TAT

## 2020-04-29 ENCOUNTER — Ambulatory Visit: Payer: Self-pay

## 2020-04-29 ENCOUNTER — Telehealth: Payer: Self-pay | Admitting: Unknown Physician Specialty

## 2020-04-29 NOTE — Telephone Encounter (Signed)
Called to discuss with patient about COVID-19 symptoms and the use of one of the available treatments for those with mild to moderate Covid symptoms and at a high risk of hospitalization.  Pt appears to qualify for outpatient treatment due to co-morbid conditions and/or a member of an at-risk group in accordance with the FDA Emergency Use Authorization.    Symptom onset: 1/3 Vaccinated: no Qualifiers: age  Pt refused  Kathrine Haddock

## 2020-04-29 NOTE — Telephone Encounter (Signed)
Returned call to patient who state he received a call today about having a treatment for COVID-19. Per chart patient was contacted by Kathrine Haddock NP today.  He states that he thought it was a robo call and did not speak to her. After I explained what she was calling to discuss he wishes to speak with her again about the treatment options. Note will be routed to office for review and follow up.  Reason for Disposition . [1] Caller requesting NON-URGENT health information AND [2] PCP's office is the best resource  Protocols used: INFORMATION ONLY CALL - NO TRIAGE-A-AH

## 2020-05-05 ENCOUNTER — Ambulatory Visit: Payer: Self-pay | Admitting: Family Medicine

## 2020-05-05 NOTE — Telephone Encounter (Signed)
  Reason for Disposition . COVID-19 Home Isolation, questions about  Answer Assessment - Initial Assessment Questions 1. COVID-19 DIAGNOSIS: "Who made your COVID-19 diagnosis?" "Was it confirmed by a positive lab test?" If not diagnosed by a HCP, ask "Are there lots of cases (community spread) where you live?" Note: See public health department website, if unsure.     NA 2. COVID-19 EXPOSURE: "Was there any known exposure to COVID before the symptoms began?" CDC Definition of close contact: within 6 feet (2 meters) for a total of 15 minutes or more over a 24-hour period.      NA 3. ONSET: "When did the COVID-19 symptoms start?"      04/25/2020 4. WORST SYMPTOM: "What is your worst symptom?" (e.g., cough, fever, shortness of breath, muscle aches)    No taste or smell 5. COUGH: "Do you have a cough?" If Yes, ask: "How bad is the cough?"       na 6. FEVER: "Do you have a fever?" If Yes, ask: "What is your temperature, how was it measured, and when did it start?"     Ranges from 97.77F - 99.57F 7. RESPIRATORY STATUS: "Describe your breathing?" (e.g., shortness of breath, wheezing, unable to speak)      na 8. BETTER-SAME-WORSE: "Are you getting better, staying the same or getting worse compared to yesterday?"  If getting worse, ask, "In what way?"     Better 9. HIGH RISK DISEASE: "Do you have any chronic medical problems?" (e.g., asthma, heart or lung disease, weak immune system, obesity, etc.)     NA 10. VACCINE: "Have you gotten the COVID-19 vaccine?" If Yes ask: "Which one, how many shots, when did you get it?"       No 11. PREGNANCY: "Is there any chance you are pregnant?" "When was your last menstrual period?"       NA 12. OTHER SYMPTOMS: "Do you have any other symptoms?"  (e.g., chills, fatigue, headache, loss of smell or taste, muscle pain, sore throat; new loss of smell or taste especially support the diagnosis of COVID-19)       NA  Protocols used: CORONAVIRUS (COVID-19) DIAGNOSED OR  SUSPECTED-A-AH

## 2020-05-19 ENCOUNTER — Other Ambulatory Visit: Payer: Self-pay | Admitting: Family Medicine

## 2020-05-19 NOTE — Telephone Encounter (Signed)
Requested medication (s) are due for refill today: yes  Requested medication (s) are on the active medication list: yes  Last refill:  04/26/20  #30  0 refills  Future visit scheduled yes  Notes to clinic:  high caution received 10mg  dose     Requested Prescriptions  Pending Prescriptions Disp Refills   cetirizine (ZYRTEC) 10 MG tablet [Pharmacy Med Name: CETIRIZINE HCL 10 MG TABLET] 30 tablet 0    Sig: TAKE 1 TABLET (10 MG TOTAL) BY MOUTH DAILY AS NEEDED (SNEEZING, RUNNY NOSE).      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 Volney American, [place]   1 year ago Skin lesion   [facility] Volney American, [place]   1 year ago Essential hypertension   [facility] Volney American, [place]       Future Appointments             In 2 months Ralene Bathe, MD [place]   In 5 months Wynetta Emery, Barb Merino, DO [place], [place]   In 7 months  [employer], [place]

## 2020-05-26 ENCOUNTER — Other Ambulatory Visit: Payer: Self-pay

## 2020-05-26 ENCOUNTER — Other Ambulatory Visit: Payer: Medicare PPO

## 2020-05-26 DIAGNOSIS — R972 Elevated prostate specific antigen [PSA]: Secondary | ICD-10-CM

## 2020-05-27 LAB — PSA: Prostate Specific Ag, Serum: 4.1 ng/mL — ABNORMAL HIGH (ref 0.0–4.0)

## 2020-08-10 ENCOUNTER — Encounter: Payer: Self-pay | Admitting: Dermatology

## 2020-08-10 ENCOUNTER — Ambulatory Visit: Payer: Medicare PPO | Admitting: Dermatology

## 2020-08-10 ENCOUNTER — Other Ambulatory Visit: Payer: Self-pay

## 2020-08-10 DIAGNOSIS — Z1283 Encounter for screening for malignant neoplasm of skin: Secondary | ICD-10-CM | POA: Diagnosis not present

## 2020-08-10 DIAGNOSIS — L719 Rosacea, unspecified: Secondary | ICD-10-CM | POA: Diagnosis not present

## 2020-08-10 DIAGNOSIS — L570 Actinic keratosis: Secondary | ICD-10-CM

## 2020-08-10 DIAGNOSIS — L578 Other skin changes due to chronic exposure to nonionizing radiation: Secondary | ICD-10-CM

## 2020-08-10 DIAGNOSIS — Z85828 Personal history of other malignant neoplasm of skin: Secondary | ICD-10-CM | POA: Diagnosis not present

## 2020-08-10 DIAGNOSIS — D229 Melanocytic nevi, unspecified: Secondary | ICD-10-CM

## 2020-08-10 DIAGNOSIS — Z86007 Personal history of in-situ neoplasm of skin: Secondary | ICD-10-CM

## 2020-08-10 DIAGNOSIS — L814 Other melanin hyperpigmentation: Secondary | ICD-10-CM

## 2020-08-10 DIAGNOSIS — L821 Other seborrheic keratosis: Secondary | ICD-10-CM

## 2020-08-10 DIAGNOSIS — D1800 Hemangioma unspecified site: Secondary | ICD-10-CM

## 2020-08-10 DIAGNOSIS — D692 Other nonthrombocytopenic purpura: Secondary | ICD-10-CM

## 2020-08-10 DIAGNOSIS — L918 Other hypertrophic disorders of the skin: Secondary | ICD-10-CM

## 2020-08-10 NOTE — Progress Notes (Signed)
Follow-Up Visit   Subjective  Jack Crane is a 80 y.o. male who presents for the following: Upper body skin exam (Hx of BCC, SCCIS, AKs) and check spot  (Nose, yrs/). The patient presents for Upper Body Skin Exam (UBSE) for skin cancer screening and mole check.  The following portions of the chart were reviewed this encounter and updated as appropriate:   Tobacco  Allergies  Meds  Problems  Med Hx  Surg Hx  Fam Hx     Review of Systems:  No other skin or systemic complaints except as noted in HPI or Assessment and Plan.  Objective  Well appearing patient in no apparent distress; mood and affect are within normal limits.  All skin waist up examined.  Objective  L neck mid inframandibular: Well healed scar with no evidence of recurrence.   Objective  Left volar forearm: Well healed scar with no evidence of recurrence  Objective  Head - Anterior (Face): Erythema face  Objective  L upper lip x 2, R nose x 1 (3): Pink scaly macules    Assessment & Plan    Lentigines - Scattered tan macules - Due to sun exposure - Benign-appering, observe - Recommend daily broad spectrum sunscreen SPF 30+ to sun-exposed areas, reapply every 2 hours as needed. - Call for any changes  Seborrheic Keratoses - Stuck-on, waxy, tan-brown papules and/or plaques  - Benign-appearing - Discussed benign etiology and prognosis. - Observe - Call for any changes  Melanocytic Nevi - Tan-brown and/or pink-flesh-colored symmetric macules and papules - Benign appearing on exam today - Observation - Call clinic for new or changing moles - Recommend daily use of broad spectrum spf 30+ sunscreen to sun-exposed areas.   Hemangiomas - Red papules - Discussed benign nature - Observe - Call for any changes  Actinic Damage - Chronic condition, secondary to cumulative UV/sun exposure - diffuse scaly erythematous macules with underlying dyspigmentation - Recommend daily broad spectrum  sunscreen SPF 30+ to sun-exposed areas, reapply every 2 hours as needed.  - Staying in the shade or wearing long sleeves, sun glasses (UVA+UVB protection) and wide brim hats (4-inch brim around the entire circumference of the hat) are also recommended for sun protection.  - Call for new or changing lesions.  Skin cancer screening performed today.  Purpura - Chronic; persistent and recurrent.  Treatable, but not curable. - Violaceous macules and patches - Benign - Related to trauma, age, sun damage and/or use of blood thinners, chronic use of topical and/or oral steroids - Observe - Can use OTC arnica containing moisturizer such as Dermend Bruise Formula if desired - Call for worsening or other concerns  Acrochordons (Skin Tags) - Fleshy, skin-colored pedunculated papules - Benign appearing.  - Observe. - If desired, they can be removed with an in office procedure that is not covered by insurance. - Please call the clinic if you notice any new or changing lesions.  History of basal cell carcinoma (BCC) L neck mid inframandibular  Clear. Observe for recurrence. Call clinic for new or changing lesions.  Recommend regular skin exams, daily broad-spectrum spf 30+ sunscreen use, and photoprotection.     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 Observe for recurrence. Call clinic for new or changing lesions.  Recommend regular skin exams, daily broad-spectrum spf 30+ sunscreen use, and photoprotection.     Rosacea Head - Anterior (Face) Rosacea is a chronic progressive skin condition usually affecting the face  of adults, causing redness and/or acne bumps. It is treatable but not curable. It sometimes affects the eyes (ocular rosacea) as well. It may respond to topical and/or systemic medication and can flare with stress, sun exposure, alcohol, exercise and some foods.  Daily application of broad spectrum spf 30+ sunscreen to face is recommended to  reduce flares.  D/c Metronidazole gel  Will prescribe [facility] metronidazole/ivermectin/azelaic acid qd to bid as needed to affected areas on the face. The patient was advised this is not covered by insurance since it is made by a compounding pharmacy. They will receive an email to check out and the medication will be mailed to their home.    If pt can not get Triple cream he will cont Metronidazole 0.75% qd/bid  Other Related Medications metroNIDAZOLE (METROGEL) 0.75 % gel  AK (actinic keratosis) (3) L upper lip x 2, R nose x 1 Destruction of lesion - L upper lip x 2, R nose x 1 Complexity: simple   Destruction method: cryotherapy   Informed consent: discussed and consent obtained   Timeout:  patient name, date of birth, surgical site, and procedure verified Lesion destroyed using liquid nitrogen: Yes   Region frozen until ice ball extended beyond lesion: Yes   Outcome: patient tolerated procedure well with no complications   Post-procedure details: wound care instructions given    Return in about 6 months (around 02/09/2021) for AK f/u, Rosacea f/u.   I, Othelia Pulling, RMA, am acting as scribe for Sarina Ser, MD .  Documentation: I have reviewed the above documentation for accuracy and completeness, and I agree with the above.  Sarina Ser, MD

## 2020-08-10 NOTE — Patient Instructions (Addendum)
If you have any questions or concerns for your doctor, please call our main line at [phone] and press option 4 to reach your doctor's medical assistant. If no one answers, please leave a voicemail as directed and we will return your call as soon as possible. Messages left after 4 pm will be answered the following business day.   You may also send us a message via MyChart. We typically respond to MyChart messages within 1-2 business days.  For prescription refills, please ask your pharmacy to contact our office. Our fax number is [phone].  If you have an urgent issue when the clinic is closed that cannot wait until the next business day, you can page your doctor at the number below.    Please note that while we do our best to be available for urgent issues outside of office hours, we are not available 24/7.   If you have an urgent issue and are unable to reach us, you may choose to seek medical care at your doctor's office, retail clinic, urgent care center, or emergency room.  If you have a medical emergency, please immediately call 911 or go to the emergency department.  Pager Numbers  - Dr. Kowalski: [phone]  - Dr. Moye: [phone]  - Dr. Stewart: [phone]  In the event of inclement weather, please call our main line at [phone] for an update on the status of any delays or closures.  Dermatology Medication Tips: Please keep the boxes that topical medications come in in order to help keep track of the instructions about where and how to use these. Pharmacies typically print the medication instructions only on the boxes and not directly on the medication tubes.   If your medication is too expensive, please contact our office at [phone] option 4 or send us a message through MyChart.   We are unable to tell what your co-pay for medications will be in advance as this is different depending on your insurance coverage. However, we may be able to find a substitute  medication at lower cost or fill out paperwork to get insurance to cover a needed medication.   If a prior authorization is required to get your medication covered by your insurance company, please allow us 1-2 business days to complete this process.  Drug prices often vary depending on where the prescription is filled and some pharmacies may offer cheaper prices.  The website www.goodrx.com contains coupons for medications through different pharmacies. The prices here do not account for what the cost may be with help from insurance (it may be cheaper with your insurance), but the website can give you the price if you did not use any insurance.  - You can print the associated coupon and take it with your prescription to the pharmacy.  - You may also stop by our office during regular business hours and pick up a GoodRx coupon card.  - If you need your prescription sent electronically to a different pharmacy, notify our office through [place] MyChart or by phone at [phone] option 4.  Instructions for [facility] Medications  One or more of your medications was sent to the [facility] mail order compounding pharmacy. You will receive an email from them and can purchase the medicine through that link. It will then be mailed to your home at the address you confirmed. If for any reason you do not receive an email from them, please check your spam folder. If you still do not find the email,   please let us know. [facility] phone number is [phone].   

## 2020-08-12 ENCOUNTER — Encounter: Payer: Self-pay | Admitting: Dermatology

## 2020-10-13 ENCOUNTER — Telehealth: Payer: Self-pay

## 2020-10-13 NOTE — Telephone Encounter (Signed)
Lvm for pt letting him know that 6/28 appt was changed from Dr Wynetta Emery to his provider Santiago Glad it will still be at 10:00

## 2020-10-17 NOTE — Progress Notes (Signed)
BP 111/64   Pulse 76   Temp 97.6 F (36.4 C) (Oral)   Ht '5\' 9"'  (1.753 m)   Wt 215 lb 12.8 oz (97.9 kg)   SpO2 97%   BMI 31.87 kg/m    Subjective:    [name], male    DOB: 1940/07/15, 80 y.o.   MRN: [id number]  HPI: Jack Crane is a 80 y.o. male  Chief Complaint  Patient presents with   Diabetes   Hyperlipidemia   HYPERTENSION / [place] Satisfied with current treatment? yes Duration of hypertension: years BP monitoring frequency: not checking BP range:  BP medication side effects: no Past BP meds: none Duration of hyperlipidemia: years Cholesterol medication side effects: no Cholesterol supplements: none Past cholesterol medications: atorvastain (lipitor) Medication compliance: excellent compliance Aspirin: yes Recent stressors: no Recurrent headaches: no Visual changes: no Palpitations: no Dyspnea: no Chest pain: no Lower extremity edema: no Dizzy/lightheaded: no  DIABETES Hypoglycemic episodes:no Polydipsia/polyuria: no Visual disturbance: no Chest pain: no Paresthesias: no Glucose Monitoring: no  Accucheck frequency: Not Checking  Fasting glucose:  Post prandial:  Evening:  Before meals: Taking Insulin?: no  Long acting insulin:  Short acting insulin: Blood Pressure Monitoring: not checking Retinal Examination: Up to Date Foot Exam: Up to Date Diabetic Education: Not Completed Pneumovax: Up to Date Influenza: Up to Date Aspirin: yes   Relevant past medical, surgical, family and social history reviewed and updated as indicated. Interim medical history since our last visit reviewed. Allergies and medications reviewed and updated.  Review of Systems  Eyes:  Negative for visual disturbance.  Respiratory:  Negative for chest tightness and shortness of breath.   Cardiovascular:  Negative for chest pain, palpitations and leg swelling.  Endocrine: Negative for polydipsia and polyuria.  Neurological:  Negative for  dizziness, light-headedness, numbness and headaches.   Per HPI unless specifically indicated above     Objective:    BP 111/64   Pulse 76   Temp 97.6 F (36.4 C) (Oral)   Ht '5\' 9"'  (1.753 m)   Wt 215 lb 12.8 oz (97.9 kg)   SpO2 97%   BMI 31.87 kg/m   Wt Readings from Last 3 Encounters:  10/18/20 215 lb 12.8 oz (97.9 kg)  04/20/20 221 lb 3.2 oz (100.3 kg)  12/21/19 220 lb (99.8 kg)    Physical Exam Vitals and nursing note reviewed.  Constitutional:      General: He is not in acute distress.    Appearance: Normal appearance. He is not ill-appearing, toxic-appearing or diaphoretic.  HENT:     Head: Normocephalic.     Right Ear: External ear normal.     Left Ear: External ear normal.     Nose: Nose normal. No congestion or rhinorrhea.     Mouth/Throat:     Mouth: Mucous membranes are moist.  Eyes:     General:        Right eye: No discharge.        Left eye: No discharge.     Extraocular Movements: Extraocular movements intact.     Conjunctiva/sclera: Conjunctivae normal.     Pupils: Pupils are equal, round, and reactive to light.  Cardiovascular:     Rate and Rhythm: Normal rate and regular rhythm.     Heart sounds: No murmur heard. Pulmonary:     Effort: Pulmonary effort is normal. No respiratory distress.     Breath sounds: Normal breath sounds. No wheezing, rhonchi or rales.  Abdominal:  General: Abdomen is flat. Bowel sounds are normal.  Musculoskeletal:     Cervical back: Normal range of motion and neck supple.  Skin:    General: Skin is warm and dry.     Capillary Refill: Capillary refill takes less than 2 seconds.  Neurological:     General: No focal deficit present.     Mental Status: He is alert and oriented to person, place, and time.  Psychiatric:        Mood and Affect: Mood normal.        Behavior: Behavior normal.        Thought Content: Thought content normal.        Judgment: Judgment normal.    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  Controlled.  Continue with current medication regimen.  Labs ordered today.  Return to clinic in 6 months for reevaluation.  Call sooner if concerns arise.         Relevant Orders   Comp Met (CMET)   Lipid Profile   HgB A1c   Hypertension associated with diabetes (HCC)    Chronic.  Controlled without medication.  Labs ordered today.  Return to clinic in 6 months for reevaluation.  Call sooner if concerns arise.         Relevant Orders   Comp Met (CMET)   Lipid Profile   HgB A1c     Genitourinary   BPH (benign prostatic hyperplasia)   Relevant Orders   Comp Met (CMET)   Lipid Profile   HgB A1c     Other   Pure hypercholesterolemia    Chronic.  Controlled without medication.  Labs ordered today.  Return to clinic in 6 months for reevaluation.  Call sooner if concerns arise.         Relevant Orders   Comp Met (CMET)   Lipid Profile   HgB A1c     Follow up plan: Return in about 6 months (around 04/19/2021) for Physical and Fasting labs.

## 2020-10-18 ENCOUNTER — Ambulatory Visit: Payer: Medicare PPO | Admitting: Nurse Practitioner

## 2020-10-18 ENCOUNTER — Encounter: Payer: Self-pay | Admitting: Nurse Practitioner

## 2020-10-18 ENCOUNTER — Other Ambulatory Visit: Payer: Self-pay

## 2020-10-18 VITALS — BP 111/64 | HR 76 | Temp 97.6°F | Ht 69.0 in | Wt 215.8 lb

## 2020-10-18 DIAGNOSIS — N181 Chronic kidney disease, stage 1: Secondary | ICD-10-CM

## 2020-10-18 DIAGNOSIS — N400 Enlarged prostate without lower urinary tract symptoms: Secondary | ICD-10-CM | POA: Diagnosis not present

## 2020-10-18 DIAGNOSIS — E1122 Type 2 diabetes mellitus with diabetic chronic kidney disease: Secondary | ICD-10-CM | POA: Diagnosis not present

## 2020-10-18 DIAGNOSIS — I129 Hypertensive chronic kidney disease with stage 1 through stage 4 chronic kidney disease, or unspecified chronic kidney disease: Secondary | ICD-10-CM

## 2020-10-18 DIAGNOSIS — I152 Hypertension secondary to endocrine disorders: Secondary | ICD-10-CM

## 2020-10-18 DIAGNOSIS — E1159 Type 2 diabetes mellitus with other circulatory complications: Secondary | ICD-10-CM | POA: Diagnosis not present

## 2020-10-18 DIAGNOSIS — E7800 Pure hypercholesterolemia, unspecified: Secondary | ICD-10-CM | POA: Diagnosis not present

## 2020-10-18 NOTE — Assessment & Plan Note (Signed)
Chronic.  Controlled.  Continue with current medication regimen.  Labs ordered today.  Return to clinic in 6 months for reevaluation.  Call sooner if concerns arise.  ? ?

## 2020-10-18 NOTE — Assessment & Plan Note (Signed)
Chronic.  Controlled without medication..  Labs ordered today.  Return to clinic in 6 months for reevaluation.  Call sooner if concerns arise.  ° °

## 2020-10-19 LAB — COMPREHENSIVE METABOLIC PANEL
ALT: 12 IU/L (ref 0–44)
AST: 18 IU/L (ref 0–40)
Albumin/Globulin Ratio: 1.4 (ref 1.2–2.2)
Albumin: 3.8 g/dL (ref 3.7–4.7)
Alkaline Phosphatase: 74 IU/L (ref 44–121)
BUN/Creatinine Ratio: 13 (ref 10–24)
BUN: 14 mg/dL (ref 8–27)
Bilirubin Total: 0.5 mg/dL (ref 0.0–1.2)
CO2: 19 mmol/L — ABNORMAL LOW (ref 20–29)
Calcium: 8.6 mg/dL (ref 8.6–10.2)
Chloride: 109 mmol/L — ABNORMAL HIGH (ref 96–106)
Creatinine, Ser: 1.05 mg/dL (ref 0.76–1.27)
Globulin, Total: 2.7 g/dL (ref 1.5–4.5)
Glucose: 173 mg/dL — ABNORMAL HIGH (ref 65–99)
Potassium: 4.3 mmol/L (ref 3.5–5.2)
Sodium: 146 mmol/L — ABNORMAL HIGH (ref 134–144)
Total Protein: 6.5 g/dL (ref 6.0–8.5)
eGFR: 72 mL/min/{1.73_m2} (ref >59)

## 2020-10-19 LAB — LIPID PANEL
Chol/HDL Ratio: 3.1 ratio (ref 0.0–5.0)
Cholesterol, Total: 116 mg/dL (ref 100–199)
HDL: 37 mg/dL — ABNORMAL LOW (ref >39)
LDL Chol Calc (NIH): 60 mg/dL (ref 0–99)
Triglycerides: 100 mg/dL (ref 0–149)
VLDL Cholesterol Cal: 19 mg/dL (ref 5–40)

## 2020-10-19 LAB — HEMOGLOBIN A1C
Est. average glucose Bld gHb Est-mCnc: 157 mg/dL
Hgb A1c MFr Bld: 7.1 % — ABNORMAL HIGH (ref 4.8–5.6)

## 2020-10-19 NOTE — Progress Notes (Signed)
Please let patient know that his lab work looks good. A2c improved form 7.5 to 7.1. Cholesterol is well controlled.  Continue with current medication regimen.  Follow up as discussed.

## 2020-11-14 LAB — HM DIABETES EYE EXAM

## 2020-11-23 ENCOUNTER — Other Ambulatory Visit: Payer: Self-pay

## 2020-11-23 ENCOUNTER — Encounter: Payer: Self-pay | Admitting: Dermatology

## 2020-11-23 ENCOUNTER — Ambulatory Visit: Payer: Medicare PPO | Admitting: Dermatology

## 2020-11-23 DIAGNOSIS — I872 Venous insufficiency (chronic) (peripheral): Secondary | ICD-10-CM

## 2020-11-23 DIAGNOSIS — D1800 Hemangioma unspecified site: Secondary | ICD-10-CM

## 2020-11-23 DIAGNOSIS — Z85828 Personal history of other malignant neoplasm of skin: Secondary | ICD-10-CM

## 2020-11-23 DIAGNOSIS — Z1283 Encounter for screening for malignant neoplasm of skin: Secondary | ICD-10-CM | POA: Diagnosis not present

## 2020-11-23 DIAGNOSIS — L821 Other seborrheic keratosis: Secondary | ICD-10-CM

## 2020-11-23 DIAGNOSIS — L570 Actinic keratosis: Secondary | ICD-10-CM

## 2020-11-23 DIAGNOSIS — L820 Inflamed seborrheic keratosis: Secondary | ICD-10-CM

## 2020-11-23 DIAGNOSIS — L578 Other skin changes due to chronic exposure to nonionizing radiation: Secondary | ICD-10-CM

## 2020-11-23 DIAGNOSIS — L719 Rosacea, unspecified: Secondary | ICD-10-CM | POA: Diagnosis not present

## 2020-11-23 DIAGNOSIS — D229 Melanocytic nevi, unspecified: Secondary | ICD-10-CM

## 2020-11-23 DIAGNOSIS — L814 Other melanin hyperpigmentation: Secondary | ICD-10-CM

## 2020-11-23 NOTE — Progress Notes (Signed)
Follow-Up Visit   Subjective  Jack Crane is a 80 y.o. male who presents for the following: Annual Exam (Hx BCC, SCC, AK's - hx of rosacea patient was unable to get the Skin Medicinals mix due to not having a credit card. He continues to use the Metronidazole BID to the nose.). The patient presents for Total-Body Skin Exam (TBSE) for skin cancer screening and mole check.  The following portions of the chart were reviewed this encounter and updated as appropriate:   Tobacco  Allergies  Meds  Problems  Med Hx  Surg Hx  Fam Hx     Review of Systems:  No other skin or systemic complaints except as noted in HPI or Assessment and Plan.  Objective  Well appearing patient in no apparent distress; mood and affect are within normal limits.  A full examination was performed including scalp, head, eyes, ears, nose, lips, neck, chest, axillae, abdomen, back, buttocks, bilateral upper extremities, bilateral lower extremities, hands, feet, fingers, toes, fingernails, and toenails. All findings within normal limits unless otherwise noted below.  Face Mid face erythema with telangiectasias +/- scattered inflammatory papules.   Face x 10 (10) Erythematous thin papules/macules with gritty scale.   Face x 2 (2) Erythematous keratotic or waxy stuck-on papule or plaque.   B/L leg Stasis changes.   Assessment & Plan  Rosacea Face  Rosacea is a chronic progressive skin condition usually affecting the face of adults, causing redness and/or acne bumps. It is treatable but not curable. It sometimes affects the eyes (ocular rosacea) as well. It may respond to topical and/or systemic medication and can flare with stress, sun exposure, alcohol, exercise and some foods.  Daily application of broad spectrum spf 30+ sunscreen to face is recommended to reduce flares.  Continue Metronidazole BID.   Related Medications metroNIDAZOLE (METROGEL) 0.75 % gel Apply to affected areas face 1-2 times daily  as needed  AK (actinic keratosis) (10) Face x 10  Destruction of lesion - Face x 10 Complexity: simple   Destruction method: cryotherapy   Informed consent: discussed and consent obtained   Timeout:  patient name, date of birth, surgical site, and procedure verified Lesion destroyed using liquid nitrogen: Yes   Region frozen until ice ball extended beyond lesion: Yes   Outcome: patient tolerated procedure well with no complications   Post-procedure details: wound care instructions given    Inflamed seborrheic keratosis Face x 2  Destruction of lesion - Face x 2 Complexity: simple   Destruction method: cryotherapy   Informed consent: discussed and consent obtained   Timeout:  patient name, date of birth, surgical site, and procedure verified Lesion destroyed using liquid nitrogen: Yes   Region frozen until ice ball extended beyond lesion: Yes   Outcome: patient tolerated procedure well with no complications   Post-procedure details: wound care instructions given    Stasis dermatitis of both legs B/L leg  Benign-appearing.  Observation.  Call clinic for new or changing lesions.  Recommend daily use of broad spectrum spf 30+ sunscreen to sun-exposed areas.    Lentigines - Scattered tan macules - Due to sun exposure - Benign-appering, observe - Recommend daily broad spectrum sunscreen SPF 30+ to sun-exposed areas, reapply every 2 hours as needed. - Call for any changes  Seborrheic Keratoses - Stuck-on, waxy, tan-brown papules and/or plaques  - Benign-appearing - Discussed benign etiology and prognosis. - Observe - Call for any changes  Melanocytic Nevi - Tan-brown and/or pink-flesh-colored symmetric macules and  papules - Benign appearing on exam today - Observation - Call clinic for new or changing moles - Recommend daily use of broad spectrum spf 30+ sunscreen to sun-exposed areas.   Hemangiomas - Red papules - Discussed benign nature - Observe - Call for any  changes  Actinic Damage - Chronic condition, secondary to cumulative UV/sun exposure - diffuse scaly erythematous macules with underlying dyspigmentation - Recommend daily broad spectrum sunscreen SPF 30+ to sun-exposed areas, reapply every 2 hours as needed.  - Staying in the shade or wearing long sleeves, sun glasses (UVA+UVB protection) and wide brim hats (4-inch brim around the entire circumference of the hat) are also recommended for sun protection.  - Call for new or changing lesions.  History of Squamous Cell Carcinoma of the Skin - No evidence of recurrence today - No lymphadenopathy - Recommend regular full body skin exams - Recommend daily broad spectrum sunscreen SPF 30+ to sun-exposed areas, reapply every 2 hours as needed.  - Call if any new or changing lesions are noted between office visits  History of Basal Cell Carcinoma of the Skin - No evidence of recurrence today - Recommend regular full body skin exams - Recommend daily broad spectrum sunscreen SPF 30+ to sun-exposed areas, reapply every 2 hours as needed.  - Call if any new or changing lesions are noted between office visits  Skin cancer screening performed today.  Return in about 9 months (around 08/23/2021) for sun exposed areas, rosacea.  Luther Redo, CMA, am acting as scribe for Sarina Ser, MD . Documentation: I have reviewed the above documentation for accuracy and completeness, and I agree with the above.  Sarina Ser, MD

## 2020-11-23 NOTE — Patient Instructions (Signed)

## 2020-12-05 ENCOUNTER — Telehealth: Payer: Self-pay | Admitting: Nurse Practitioner

## 2020-12-05 NOTE — Telephone Encounter (Signed)
Copied from [place] [id number]. Topic: Medicare AWV >> Dec 05, 2020  3:10 PM Lavonia Drafts wrote: Reason for CRM:  LM: reschedule AWVS to 12/23/20 @ 1:45 due to [facility] schedule change. This will be by phone not in the office.

## 2020-12-06 ENCOUNTER — Ambulatory Visit
Admission: RE | Admit: 2020-12-06 | Discharge: 2020-12-06 | Disposition: A | Discharge status: Home / Self Care | Payer: Medicare PPO | Source: Home / Self Care | Attending: Family Medicine | Admitting: Family Medicine

## 2020-12-06 ENCOUNTER — Ambulatory Visit: Payer: Medicare PPO | Admitting: Family Medicine

## 2020-12-06 ENCOUNTER — Ambulatory Visit
Admission: RE | Admit: 2020-12-06 | Discharge: 2020-12-06 | Disposition: A | Discharge status: Home / Self Care | Payer: Medicare PPO | Source: Ambulatory Visit | Attending: Family Medicine | Admitting: Family Medicine

## 2020-12-06 ENCOUNTER — Other Ambulatory Visit: Payer: Self-pay

## 2020-12-06 ENCOUNTER — Other Ambulatory Visit: Payer: Self-pay | Admitting: Family Medicine

## 2020-12-06 ENCOUNTER — Encounter: Payer: Self-pay | Admitting: Family Medicine

## 2020-12-06 VITALS — BP 105/71 | HR 91 | Temp 97.7°F | Ht 69.02 in | Wt 210.2 lb

## 2020-12-06 DIAGNOSIS — M25562 Pain in left knee: Secondary | ICD-10-CM | POA: Diagnosis present

## 2020-12-06 DIAGNOSIS — E7800 Pure hypercholesterolemia, unspecified: Secondary | ICD-10-CM

## 2020-12-06 DIAGNOSIS — M25561 Pain in right knee: Secondary | ICD-10-CM

## 2020-12-06 MED ORDER — DICLOFENAC SODIUM 1 % EX GEL: 4.0000 g | Freq: Four times a day (QID) | CUTANEOUS | 12 refills | Status: DC

## 2020-12-06 NOTE — Telephone Encounter (Signed)
Requested medication (s) are due for refill today: yes  Requested medication (s) are on the active medication list: yes  Last refill:  04/20/20 #90 1 RF  Future visit scheduled: yes  Notes to clinic:  overdue lab work   Requested Prescriptions  Pending Prescriptions Disp Refills   atorvastatin (LIPITOR) 40 MG tablet [Pharmacy Med Name: ATORVASTATIN 40 MG TABLET] 90 tablet 1    Sig: TAKE 1 TABLET BY MOUTH EVERY DAY     Cardiovascular:  Antilipid - Statins Failed - 12/06/2020  9:00 AM      Failed - HDL in normal range and within 360 days    HDL  Date Value Ref Range Status  10/18/2020 37 (L) >39 mg/dL Final          Passed - Total Cholesterol in normal range and within 360 days    Cholesterol, Total  Date Value Ref Range Status  10/18/2020 116 100 - 199 mg/dL Final   Cholesterol Piccolo, Waived  Date Value Ref Range Status  10/30/2018 137 <200 mg/dL Final    Comment:                            Desirable                <200                         Borderline High      200- 239                         High                     >239           Passed - LDL in normal range and within 360 days    LDL Chol Calc (NIH)  Date Value Ref Range Status  10/18/2020 60 0 - 99 mg/dL Final          Passed - Triglycerides in normal range and within 360 days    Triglycerides  Date Value Ref Range Status  10/18/2020 100 0 - 149 mg/dL Final   Triglycerides Piccolo,Waived  Date Value Ref Range Status  10/30/2018 129 <150 mg/dL Final    Comment:                            Normal                   <150                         Borderline High     150 - 199                         High                200 - 499                         Very High                >499           Passed - Patient is not pregnant      Passed - Valid encounter within last 12 months  Recent Outpatient Visits           1 month ago 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 ([place])   [facility] Jon Billings, NP   7 months ago Viral URI   [facility] Myles Gip, DO   7 months ago 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

## 2020-12-06 NOTE — Progress Notes (Signed)
BP 105/71   Pulse 91   Temp 97.7 F (36.5 C) (Oral)   Ht 5' 9.02" (1.753 m)   Wt 210 lb 3.2 oz (95.3 kg)   SpO2 97%   BMI 31.03 kg/m    Subjective:    [name], male    DOB: 1940-06-05, 79 y.o.   MRN: [id number]  HPI: Jack Crane is a 80 y.o. male  Chief Complaint  Patient presents with   Knee Pain    Started last Thursday morning, knee was painful, found it hard to get out of bed and get around. Patient wants to know if he has gout in his knees.   KNEE PAIN Duration: about a week ago for about 3 days Involved knee: bilateral Mechanism of injury: stepped off something Location:anterior Onset: sudden Severity: severe  Quality:  sharp and stabbing Frequency: constant Radiation: no Aggravating factors: weight bearing, walking, running, stairs, and bending  Alleviating factors: rest  Status: better Treatments attempted: rest, ice, heat, APAP, ibuprofen, and aleve  Relief with NSAIDs?:  mild Weakness with weight bearing or walking: no Sensation of giving way: no Locking: no Popping: no Bruising: no Swelling: no Redness: no Paresthesias/decreased sensation: no Fevers: no  Relevant past medical, surgical, family and social history reviewed and updated as indicated. Interim medical history since our last visit reviewed. Allergies and medications reviewed and updated.  Review of Systems  Constitutional: Negative.   Respiratory: Negative.    Cardiovascular: Negative.   Gastrointestinal: Negative.   Musculoskeletal:  Positive for arthralgias. Negative for back pain, gait problem, joint swelling, myalgias, neck pain and neck stiffness.  Psychiatric/Behavioral: Negative.     Per HPI unless specifically indicated above     Objective:    BP 105/71   Pulse 91   Temp 97.7 F (36.5 C) (Oral)   Ht 5' 9.02" (1.753 m)   Wt 210 lb 3.2 oz (95.3 kg)   SpO2 97%   BMI 31.03 kg/m   Wt Readings from Last 3 Encounters:  12/06/20 210 lb 3.2 oz  (95.3 kg)  10/18/20 215 lb 12.8 oz (97.9 kg)  04/20/20 221 lb 3.2 oz (100.3 kg)    Physical Exam Vitals and nursing note reviewed.  Constitutional:      General: He is not in acute distress.    Appearance: Normal appearance. He is not ill-appearing, toxic-appearing or diaphoretic.  HENT:     Head: Normocephalic and atraumatic.     Right Ear: External ear normal.     Left Ear: External ear normal.     Nose: Nose normal.     Mouth/Throat:     Mouth: Mucous membranes are moist.     Pharynx: Oropharynx is clear.  Eyes:     General: No scleral icterus.       Right eye: No discharge.        Left eye: No discharge.     Extraocular Movements: Extraocular movements intact.     Conjunctiva/sclera: Conjunctivae normal.     Pupils: Pupils are equal, round, and reactive to light.  Cardiovascular:     Rate and Rhythm: Normal rate and regular rhythm.     Pulses: Normal pulses.     Heart sounds: Normal heart sounds. No murmur heard.   No friction rub. No gallop.  Pulmonary:     Effort: Pulmonary effort is normal. No respiratory distress.     Breath sounds: Normal breath sounds. No stridor. No wheezing, rhonchi or rales.  Chest:  Chest wall: No tenderness.  Musculoskeletal:        General: Normal range of motion.     Cervical back: Normal range of motion and neck supple.  Skin:    General: Skin is warm and dry.     Capillary Refill: Capillary refill takes less than 2 seconds.     Coloration: Skin is not jaundiced or pale.     Findings: No bruising, erythema, lesion or rash.  Neurological:     General: No focal deficit present.     Mental Status: He is alert and oriented to person, place, and time. Mental status is at baseline.  Psychiatric:        Mood and Affect: Mood normal.        Behavior: Behavior normal.        Thought Content: Thought content normal.        Judgment: Judgment normal.    Results for orders placed or performed in visit on 10/18/20  Comp Met (CMET)  Result  Value Ref Range   Glucose 173 (H) 65 - 99 mg/dL   BUN 14 8 - 27 mg/dL   Creatinine, Ser 1.05 0.76 - 1.27 mg/dL   eGFR 72 >59 mL/min/1.73   BUN/Creatinine Ratio 13 10 - 24   Sodium 146 (H) 134 - 144 mmol/L   Potassium 4.3 3.5 - 5.2 mmol/L   Chloride 109 (H) 96 - 106 mmol/L   CO2 19 (L) 20 - 29 mmol/L   Calcium 8.6 8.6 - 10.2 mg/dL   Total Protein 6.5 6.0 - 8.5 g/dL   Albumin 3.8 3.7 - 4.7 g/dL   Globulin, Total 2.7 1.5 - 4.5 g/dL   Albumin/Globulin Ratio 1.4 1.2 - 2.2   Bilirubin Total 0.5 0.0 - 1.2 mg/dL   Alkaline Phosphatase 74 44 - 121 IU/L   AST 18 0 - 40 IU/L   ALT 12 0 - 44 IU/L  Lipid Profile  Result Value Ref Range   Cholesterol, Total 116 100 - 199 mg/dL   Triglycerides 100 0 - 149 mg/dL   HDL 37 (L) >39 mg/dL   VLDL Cholesterol Cal 19 5 - 40 mg/dL   LDL Chol Calc (NIH) 60 0 - 99 mg/dL   Chol/HDL Ratio 3.1 0.0 - 5.0 ratio  HgB A1c  Result Value Ref Range   Hgb A1c MFr Bld 7.1 (H) 4.8 - 5.6 %   Est. average glucose Bld gHb Est-mCnc 157 mg/dL      Assessment & Plan:   Problem List Items Addressed This Visit   None Visit Diagnoses     Acute pain of both knees    -  Primary   Likely arthritis. Will check for gout and tick diseases. Will start voltaren and obtain x-rays. Await results. Treat as needed.    Relevant Orders   Comprehensive metabolic panel   Uric acid   Lyme Disease Serology w/Reflex   Babesia microti Antibody Panel   [place] mtn spotted fvr abs pnl(IgG+IgM)   Ehrlichia Antibody Panel   DG Knee Complete 4 Views Right   DG Knee Complete 4 Views Left        Follow up plan: Return if symptoms worsen or fail to improve.

## 2020-12-06 NOTE — Telephone Encounter (Signed)
Patient last seen 10/18/20 and has appointment 04/19/21

## 2020-12-10 ENCOUNTER — Encounter: Payer: Self-pay | Admitting: Family Medicine

## 2020-12-10 LAB — LYME DISEASE SEROLOGY W/REFLEX: Lyme Total Antibody EIA: NEGATIVE

## 2020-12-19 LAB — EHRLICHIA ANTIBODY PANEL
E. Chaffeensis (HME) IgM Titer: NEGATIVE
E.Chaffeensis (HME) IgG: NEGATIVE
HGE IgG Titer: NEGATIVE
HGE IgM Titer: NEGATIVE

## 2020-12-19 LAB — COMPREHENSIVE METABOLIC PANEL
ALT: 22 IU/L (ref 0–44)
AST: 34 IU/L (ref 0–40)
Albumin/Globulin Ratio: 1.4 (ref 1.2–2.2)
Albumin: 3.9 g/dL (ref 3.7–4.7)
Alkaline Phosphatase: 72 IU/L (ref 44–121)
BUN/Creatinine Ratio: 16 (ref 10–24)
BUN: 18 mg/dL (ref 8–27)
Bilirubin Total: 0.6 mg/dL (ref 0.0–1.2)
CO2: 18 mmol/L — ABNORMAL LOW (ref 20–29)
Calcium: 8.9 mg/dL (ref 8.6–10.2)
Chloride: 98 mmol/L (ref 96–106)
Creatinine, Ser: 1.16 mg/dL (ref 0.76–1.27)
Globulin, Total: 2.8 g/dL (ref 1.5–4.5)
Glucose: 203 mg/dL — ABNORMAL HIGH (ref 65–99)
Potassium: 4.4 mmol/L (ref 3.5–5.2)
Sodium: 136 mmol/L (ref 134–144)
Total Protein: 6.7 g/dL (ref 6.0–8.5)
eGFR: 64 mL/min/{1.73_m2} (ref >59)

## 2020-12-19 LAB — BABESIA MICROTI ANTIBODY PANEL
Babesia microti IgG: <1:10 {titer}
Babesia microti IgM: <1:10 {titer}

## 2020-12-19 LAB — ROCKY MTN SPOTTED FVR ABS PNL(IGG+IGM)
RMSF IgG: POSITIVE — AB
RMSF IgM: 0.3 index (ref 0.00–0.89)

## 2020-12-19 LAB — URIC ACID: Uric Acid: 6.2 mg/dL (ref 3.8–8.4)

## 2020-12-19 LAB — RMSF, IGG, IFA: RMSF, IGG, IFA: <1:64 {titer}

## 2020-12-21 ENCOUNTER — Ambulatory Visit: Payer: Medicare PPO

## 2020-12-23 ENCOUNTER — Ambulatory Visit (INDEPENDENT_AMBULATORY_CARE_PROVIDER_SITE_OTHER): Payer: Medicare PPO

## 2020-12-23 VITALS — Ht 69.0 in | Wt 205.0 lb

## 2020-12-23 DIAGNOSIS — Z0000 Encounter for general adult medical examination without abnormal findings: Secondary | ICD-10-CM

## 2020-12-23 NOTE — Patient Instructions (Signed)
[name] , Thank you for taking time to come for your Medicare Wellness Visit. I appreciate your ongoing commitment to your health goals. Please review the following plan we discussed and let me know if I can assist you in the future.   Screening recommendations/referrals: Colonoscopy: not required Recommended yearly ophthalmology/optometry visit for glaucoma screening and checkup Recommended yearly dental visit for hygiene and checkup  Vaccinations: Influenza vaccine: due Pneumococcal vaccine: completed 10/26/2013 Tdap vaccine: completed 06/11/2017, due 06/12/2027 Shingles vaccine: discussed   Covid-19: 05/10/2020, 05/31/2020, 11/29/2020  Advanced directives: Please bring a copy of your POA (Power of Attorney) and/or Living Will to your next appointment.   Conditions/risks identified: tobacco use  Next appointment: Follow up in one year for your annual wellness visit.   Preventive Care 61 Years and Older, Male Preventive care refers to lifestyle choices and visits with your health care provider that can promote health and wellness. What does preventive care include? A yearly physical exam. This is also called an annual well check. Dental exams once or twice a year. Routine eye exams. Ask your health care provider how often you should have your eyes checked. Personal lifestyle choices, including: Daily care of your teeth and gums. Regular physical activity. Eating a healthy diet. Avoiding tobacco and drug use. Limiting alcohol use. Practicing safe sex. Taking low doses of aspirin every day. Taking vitamin and mineral supplements as recommended by your health care provider. What happens during an annual well check? The services and screenings done by your health care provider during your annual well check will depend on your age, overall health, lifestyle risk factors, and family history of disease. Counseling  Your health care provider may ask you questions about your: Alcohol  use. Tobacco use. Drug use. Emotional well-being. Home and relationship well-being. Sexual activity. Eating habits. History of falls. Memory and ability to understand (cognition). Work and work Statistician. Screening  You may have the following tests or measurements: Height, weight, and BMI. Blood pressure. Lipid and cholesterol levels. These may be checked every 5 years, or more frequently if you are over 5 years old. Skin check. Lung cancer screening. You may have this screening every year starting at age 12 if you have a 30-pack-year history of smoking and currently smoke or have quit within the past 15 years. Fecal occult blood test (FOBT) of the stool. You may have this test every year starting at age 42. Flexible sigmoidoscopy or colonoscopy. You may have a sigmoidoscopy every 5 years or a colonoscopy every 10 years starting at age 48. Prostate cancer screening. Recommendations will vary depending on your family history and other risks. Hepatitis C blood test. Hepatitis B blood test. Sexually transmitted disease (STD) testing. Diabetes screening. This is done by checking your blood sugar (glucose) after you have not eaten for a while (fasting). You may have this done every 1-3 years. Abdominal aortic aneurysm (AAA) screening. You may need this if you are a current or former smoker. Osteoporosis. You may be screened starting at age 20 if you are at high risk. Talk with your health care provider about your test results, treatment options, and if necessary, the need for more tests. Vaccines  Your health care provider may recommend certain vaccines, such as: Influenza vaccine. This is recommended every year. Tetanus, diphtheria, and acellular pertussis (Tdap, Td) vaccine. You may need a Td booster every 10 years. Zoster vaccine. You may need this after age 48. Pneumococcal 13-valent conjugate (PCV13) vaccine. One dose is recommended after  age 60. Pneumococcal polysaccharide  (PPSV23) vaccine. One dose is recommended after age 66. Talk to your health care provider about which screenings and vaccines you need and how often you need them. This information is not intended to replace advice given to you by your health care provider. Make sure you discuss any questions you have with your health care provider. Document Released: 05/06/2015 Document Revised: 12/28/2015 Document Reviewed: 02/08/2015 Elsevier Interactive Patient Education  2017 [place] Prevention in the Home Falls can cause injuries. They can happen to people of all ages. There are many things you can do to make your home safe and to help prevent falls. What can I do on the outside of my home? Regularly fix the edges of walkways and driveways and fix any cracks. Remove anything that might make you trip as you walk through a door, such as a raised step or threshold. Trim any bushes or trees on the path to your home. Use bright outdoor lighting. Clear any walking paths of anything that might make someone trip, such as rocks or tools. Regularly check to see if handrails are loose or broken. Make sure that both sides of any steps have handrails. Any raised decks and porches should have guardrails on the edges. Have any leaves, snow, or ice cleared regularly. Use sand or salt on walking paths during winter. Clean up any spills in your garage right away. This includes oil or grease spills. What can I do in the bathroom? Use night lights. Install grab bars by the toilet and in the tub and shower. Do not use towel bars as grab bars. Use non-skid mats or decals in the tub or shower. If you need to sit down in the shower, use a plastic, non-slip stool. Keep the floor dry. Clean up any water that spills on the floor as soon as it happens. Remove soap buildup in the tub or shower regularly. Attach bath mats securely with double-sided non-slip rug tape. Do not have throw rugs and other things on the  floor that can make you trip. What can I do in the bedroom? Use night lights. Make sure that you have a light by your bed that is easy to reach. Do not use any sheets or blankets that are too big for your bed. They should not hang down onto the floor. Have a firm chair that has side arms. You can use this for support while you get dressed. Do not have throw rugs and other things on the floor that can make you trip. What can I do in the kitchen? Clean up any spills right away. Avoid walking on wet floors. Keep items that you use a lot in easy-to-reach places. If you need to reach something above you, use a strong step stool that has a grab bar. Keep electrical cords out of the way. Do not use floor polish or wax that makes floors slippery. If you must use wax, use non-skid floor wax. Do not have throw rugs and other things on the floor that can make you trip. What can I do with my stairs? Do not leave any items on the stairs. Make sure that there are handrails on both sides of the stairs and use them. Fix handrails that are broken or loose. Make sure that handrails are as long as the stairways. Check any carpeting to make sure that it is firmly attached to the stairs. Fix any carpet that is loose or worn. Avoid having throw rugs  at the top or bottom of the stairs. If you do have throw rugs, attach them to the floor with carpet tape. Make sure that you have a light switch at the top of the stairs and the bottom of the stairs. If you do not have them, ask someone to add them for you. What else can I do to help prevent falls? Wear shoes that: Do not have high heels. Have rubber bottoms. Are comfortable and fit you well. Are closed at the toe. Do not wear sandals. If you use a stepladder: Make sure that it is fully opened. Do not climb a closed stepladder. Make sure that both sides of the stepladder are locked into place. Ask someone to hold it for you, if possible. Clearly mark and make  sure that you can see: Any grab bars or handrails. First and last steps. Where the edge of each step is. Use tools that help you move around (mobility aids) if they are needed. These include: Canes. Walkers. Scooters. Crutches. Turn on the lights when you go into a dark area. Replace any light bulbs as soon as they burn out. Set up your furniture so you have a clear path. Avoid moving your furniture around. If any of your floors are uneven, fix them. If there are any pets around you, be aware of where they are. Review your medicines with your doctor. Some medicines can make you feel dizzy. This can increase your chance of falling. Ask your doctor what other things that you can do to help prevent falls. This information is not intended to replace advice given to you by your health care provider. Make sure you discuss any questions you have with your health care provider. Document Released: 02/03/2009 Document Revised: 09/15/2015 Document Reviewed: 05/14/2014 Elsevier Interactive Patient Education  2017 [employer].

## 2020-12-23 NOTE — Progress Notes (Signed)
I connected with Jack Crane today by telephone and verified that I am speaking with the correct person using two identifiers. Location patient: home Location provider: work Persons participating in the virtual visit: Andrei, Roldan LPN.   I discussed the limitations, risks, security and privacy concerns of performing an evaluation and management service by telephone and the availability of in person appointments. I also discussed with the patient that there may be a patient responsible charge related to this service. The patient expressed understanding and verbally consented to this telephonic visit.    Interactive audio and video telecommunications were attempted between this provider and patient, however failed, due to patient having technical difficulties OR patient did not have access to video capability.  We continued and completed visit with audio only.     Vital signs may be patient reported or missing.  Subjective:   Jack Crane is a 80 y.o. male who presents for Medicare Annual/Subsequent preventive examination.  Review of Systems     Cardiac Risk Factors include: advanced age (>68mn, >>52women);diabetes mellitus;hypertension;male gender;obesity (BMI >30kg/m2);sedentary lifestyle     Objective:    Today's Vitals   12/23/20 1341  Weight: 205 lb (93 kg)  Height: '5\' 9"'$  (1.753 m)   Body mass index is 30.27 kg/m.  Advanced Directives 12/23/2020 12/21/2019 12/11/2018 10/31/2018 12/06/2017 11/30/2016  Does Patient Have a Medical Advance Directive? Yes Yes Yes No Yes No  Type of [place] Living will;Healthcare Power of Attorney - Living will;Healthcare Power of Attorney -  Copy of HKempin Chart? No - copy requested No - copy requested Yes - validated most recent copy scanned in chart (See row information) - No - copy requested -  Would patient like  information on creating a medical advance directive? - - - No - Patient declined - Yes (MAU/Ambulatory/Procedural Areas - Information given)    Current Medications (verified) Outpatient Encounter Medications as of 12/23/2020  Medication Sig   aspirin 81 MG tablet Take 81 mg by mouth daily.   atorvastatin (LIPITOR) 40 MG tablet TAKE 1 TABLET BY MOUTH EVERY DAY   diclofenac Sodium (VOLTAREN) 1 % GEL Apply 4 g topically 4 (four) times daily.   glucose blood test strip 1 each by Other route as needed for other. Use as instructed   metFORMIN (GLUCOPHAGE) 1000 MG tablet Take 1 tablet (1,000 mg total) by mouth 2 (two) times daily with a meal.   metroNIDAZOLE (METROGEL) 0.75 % gel Apply to affected areas face 1-2 times daily as needed   No facility-administered encounter medications on file as of 12/23/2020.    Allergies (verified) Patient has no known allergies.   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   L volar forearm - SCCIS    Past Surgical History:  Procedure Laterality Date   APPENDECTOMY     stomach abcess     Family History  Problem Relation Age of Onset   Diabetes Mother    Cancer Sister        lung   Diabetes Brother    Diabetes Brother    Diabetes Brother    Social History   Socioeconomic History  Marital status: Single    Spouse name: Not on file   Number of children: Not on file   Years of education: Not on file   Highest education level: 9th grade  Occupational History   Not on file  Tobacco Use   Smoking status: Former    Types: Cigarettes    Quit date: 11/02/1974    Years since quitting: 46.1   Smokeless tobacco: Current    Types: Chew  Vaping Use   Vaping Use: Never used  Substance and Sexual Activity   Alcohol  use: No    Alcohol/week: 0.0 standard drinks   Drug use: No   Sexual activity: Yes    Birth control/protection: None  Other Topics Concern   Not on file  Social History Narrative   Not on file   Social Determinants of Health   Financial Resource Strain: Low Risk    Difficulty of Paying Living Expenses: Not hard at all  Food Insecurity: No Food Insecurity   Worried About Charity fundraiser in the Last Year: Never true   [place] in the Last Year: Never true  Transportation Needs: No Transportation Needs   Lack of Transportation (Medical): No   Lack of Transportation (Non-Medical): No  Physical Activity: Inactive   Days of Exercise per Week: 0 days   Minutes of Exercise per Session: 0 min  Stress: No Stress Concern Present   Feeling of Stress : Not at all  Social Connections: Not on file    Tobacco Counseling Ready to quit: Not Answered Counseling given: Not Answered   Clinical Intake:  Pre-visit preparation completed: Yes  Pain : No/denies pain     Nutritional Status: BMI > 30  Obese Nutritional Risks: None Diabetes: Yes  How often do you need to have someone help you when you read instructions, pamphlets, or other written materials from your doctor or pharmacy?: 1 - Never What is the last grade level you completed in school?: 9th grade  Diabetic? Yes Nutrition Risk Assessment:  Has the patient had any N/V/D within the last 2 months?  No  Does the patient have any non-healing wounds?  No  Has the patient had any unintentional weight loss or weight gain?  No   Diabetes:  Is the patient diabetic?  Yes  If diabetic, was a CBG obtained today?  No  Did the patient bring in their glucometer from home?  No  How often do you monitor your CBG's? daily.   Financial Strains and Diabetes Management:  Are you having any financial strains with the device, your supplies or your medication? No .  Does the patient want to be seen by Chronic Care Management for  management of their diabetes?  No  Would the patient like to be referred to a Nutritionist or for Diabetic Management?  No   Diabetic Exams:  Diabetic Eye Exam: Overdue for diabetic eye exam. Pt has been advised about the importance in completing this exam. Patient advised to call and schedule an eye exam. Diabetic Foot Exam: Overdue, Pt has been advised about the importance in completing this exam. Pt is scheduled for diabetic foot exam on next appointment.   Interpreter Needed?: No  Information entered by :: NAllen LPN   Activities of Daily Living In your present state of health, do you have any difficulty performing the following activities: 12/23/2020  Hearing? N  Vision? N  Difficulty concentrating or making decisions? N  Walking or climbing stairs? N  Dressing or bathing? N  Doing errands, shopping? N  Preparing Food and eating ? N  Using the Toilet? N  In the past six months, have you accidently leaked urine? N  Do you have problems with loss of bowel control? N  Managing your Medications? N  Managing your Finances? N  Housekeeping or managing your Housekeeping? N  Some recent data might be hidden    Patient Care Team: Jon Billings, NP as PCP - General Ralene Bathe, MD (Dermatology) Leandrew Koyanagi, MD as Referring Physician (Ophthalmology)  Indicate any recent Medical Services you may have received from other than [facility] providers in the past year (date may be approximate).     Assessment:   This is a routine wellness examination for Jack Crane.  Hearing/Vision screen Vision Screening - Comments:: Regular eye exams, [facility]  Dietary issues and exercise activities discussed: Current Exercise Habits: The patient does not participate in regular exercise at present   Goals Addressed             This Visit's Progress    Patient Stated       12/23/2020, no goals       Depression Screen PHQ 2/9 Scores 12/23/2020 12/21/2019 10/14/2019  12/11/2018 12/06/2017 06/11/2017 12/12/2016  PHQ - 2 Score 0 0 0 0 0 0 0  PHQ- 9 Score - - 0 - - - -    Fall Risk Fall Risk  12/23/2020 12/21/2019 12/11/2018 12/06/2017 06/11/2017  Falls in the past year? 0 0 0 No No  Risk for fall due to : Medication side effect Medication side effect - - -  Follow up Falls evaluation completed;Education provided;Falls prevention discussed Falls evaluation completed;Education provided;Falls prevention discussed - - -    FALL RISK PREVENTION PERTAINING TO THE HOME:  Any stairs in or around the home? Yes  If so, are there any without handrails? No  Home free of loose throw rugs in walkways, pet beds, electrical cords, etc? Yes  Adequate lighting in your home to reduce risk of falls? Yes   ASSISTIVE DEVICES UTILIZED TO PREVENT FALLS:  Life alert? No  Use of a cane, walker or w/c? No  Grab bars in the bathroom? No  Shower chair or bench in shower? No  Elevated toilet seat or a handicapped toilet? Yes   TIMED UP AND GO:  Was the test performed? No .      Cognitive Function:     6CIT Screen 12/23/2020 12/21/2019 12/11/2018 12/06/2017 11/30/2016  What Year? 0 points 0 points 0 points 0 points 0 points  What month? 0 points 0 points 0 points 0 points 0 points  What time? 0 points 0 points 0 points 0 points 0 points  Count back from 20 0 points 0 points 0 points 0 points 0 points  Months in reverse 4 points 4 points 0 points 0 points 0 points  Repeat phrase 10 points 10 points 2 points 0 points 2 points  Total Score '14 14 2 '$ 0 2    Immunizations Immunization History  Administered Date(s) Administered   Fluad Quad(high Dose 65+) 04/20/2020   Influenza, High Dose Seasonal PF 03/25/2017   Influenza,inj,Quad PF,6+ Mos 06/07/2016   Influenza,inj,quad, With Preservative 02/21/2018, 01/16/2019   PFIZER(Purple Top)SARS-COV-2 Vaccination 05/10/2020, 05/31/2020, 11/29/2020   Pneumococcal Conjugate-13 10/26/2013   Pneumococcal Polysaccharide-23 01/16/2007   Td  02/13/2007, 06/11/2017   Zoster, Live 06/16/2009    TDAP status: Up to date  Flu Vaccine status: Due, Education has been  provided regarding the importance of this vaccine. Advised may receive this vaccine at local pharmacy or Health Dept. Aware to provide a copy of the vaccination record if obtained from local pharmacy or Health Dept. Verbalized acceptance and understanding.  Pneumococcal vaccine status: Up to date  Covid-19 vaccine status: Completed vaccines  Qualifies for Shingles Vaccine? Yes   Zostavax completed Yes   Shingrix Completed?: No.    Education has been provided regarding the importance of this vaccine. Patient has been advised to call insurance company to determine out of pocket expense if they have not yet received this vaccine. Advised may also receive vaccine at local pharmacy or Health Dept. Verbalized acceptance and understanding.  Screening Tests Health Maintenance  Topic Date Due   OPHTHALMOLOGY EXAM  11/11/2020   INFLUENZA VACCINE  11/21/2020   Zoster Vaccines- Shingrix (1 of 2) 01/18/2021 (Originally 02/10/1960)   FOOT EXAM  12/06/2021 (Originally 10/13/2020)   COVID-19 Vaccine (4 - Booster for Pfizer series) 03/01/2021   HEMOGLOBIN A1C  04/19/2021   URINE MICROALBUMIN  04/20/2021   TETANUS/TDAP  06/12/2027   Hepatitis C Screening  Completed   PNA vac Low Risk Adult  Completed   HPV VACCINES  Aged Out    Health Maintenance  Health Maintenance Due  Topic Date Due   OPHTHALMOLOGY EXAM  11/11/2020   INFLUENZA VACCINE  11/21/2020    Colorectal cancer screening: No longer required.   Lung Cancer Screening: (Low Dose CT Chest recommended if Age 62-80 years, 30 pack-year currently smoking OR have quit w/in 15years.) does not qualify.   Lung Cancer Screening Referral: no  Additional Screening:  Hepatitis C Screening: does qualify; Completed 10/14/2019  Vision Screening: Recommended annual ophthalmology exams for early detection of glaucoma and other  disorders of the eye. Is the patient up to date with their annual eye exam?  No  Who is the provider or what is the name of the office in which the patient attends annual eye exams? [facility] If pt is not established with a provider, would they like to be referred to a provider to establish care? No .   Dental Screening: Recommended annual dental exams for proper oral hygiene  Community Resource Referral / Chronic Care Management: CRR required this visit?  No   CCM required this visit?  No      Plan:     I have personally reviewed and noted the following in the patient's chart:   Medical and social history Use of alcohol, tobacco or illicit drugs  Current medications and supplements including opioid prescriptions. Patient is not currently taking opioid prescriptions. Functional ability and status Nutritional status Physical activity Advanced directives List of other physicians Hospitalizations, surgeries, and ER visits in previous 12 months Vitals Screenings to include cognitive, depression, and falls Referrals and appointments  In addition, I have reviewed and discussed with patient certain preventive protocols, quality metrics, and best practice recommendations. A written personalized care plan for preventive services as well as general preventive health recommendations were provided to patient.     Kellie Simmering, LPN   [id number]   Nurse Notes:

## 2021-01-31 ENCOUNTER — Other Ambulatory Visit: Payer: Self-pay | Admitting: Family Medicine

## 2021-02-09 ENCOUNTER — Ambulatory Visit: Payer: Medicare PPO | Admitting: Dermatology

## 2021-02-15 ENCOUNTER — Ambulatory Visit (INDEPENDENT_AMBULATORY_CARE_PROVIDER_SITE_OTHER): Payer: Medicare PPO

## 2021-02-15 ENCOUNTER — Other Ambulatory Visit: Payer: Self-pay

## 2021-02-15 DIAGNOSIS — Z23 Encounter for immunization: Secondary | ICD-10-CM | POA: Diagnosis not present

## 2021-03-06 ENCOUNTER — Other Ambulatory Visit: Payer: Self-pay | Admitting: Dermatology

## 2021-03-06 DIAGNOSIS — L719 Rosacea, unspecified: Secondary | ICD-10-CM

## 2021-04-18 NOTE — Progress Notes (Signed)
BP 122/65    Pulse 64    Temp 98.5 F (36.9 C) (Oral)    Ht 5' 9" (1.753 m)    Wt 203 lb 9.6 oz (92.4 kg)    SpO2 94%    BMI 30.07 kg/m    Subjective:    [name], male    DOB: 11-24-1940, 80 y.o.   MRN: [id number]  HPI: Jack Crane is a 80 y.o. male presenting on 04/19/2021 for comprehensive medical examination. Current medical complaints include:none  He currently lives with: Interim Problems from his last visit: no  HYPERTENSION / HYPERLIPIDEMIA Satisfied with current treatment? no Duration of hypertension: years BP monitoring frequency: not checking BP range:  BP medication side effects: no Past BP meds: none Duration of hyperlipidemia: years Cholesterol medication side effects: no Cholesterol supplements: none Past cholesterol medications: atorvastain (lipitor) Medication compliance: excellent compliance Aspirin: yes Recent stressors: no Recurrent headaches: no Visual changes: no Palpitations: no Dyspnea: no Chest pain: no Lower extremity edema: no Dizzy/lightheaded: no   DIABETES Hypoglycemic episodes:no Polydipsia/polyuria: no Visual disturbance: no Chest pain: no Paresthesias: no Glucose Monitoring: no  Accucheck frequency: Not Checking  Fasting glucose:  Post prandial:  Evening:  Before meals: Taking Insulin?: no  Long acting insulin:  Short acting insulin: Blood Pressure Monitoring: not checking Retinal Examination: Up to Date Foot Exam: Up to Date Diabetic Education: Not Completed Pneumovax: Up to Date Influenza: Up to Date Aspirin: yes   Depression Screen done today and results listed below:  Depression screen [facility] 2/9 04/19/2021 12/23/2020 12/21/2019 10/14/2019 12/11/2018  Decreased Interest 0 0 0 0 0  Down, Depressed, Hopeless 0 0 0 0 0  PHQ - 2 Score 0 0 0 0 0  Altered sleeping 0 - - 0 -  Tired, decreased energy 0 - - 0 -  Change in appetite 0 - - 0 -  Feeling bad or failure about yourself  0 - - 0 -  Trouble  concentrating 0 - - 0 -  Moving slowly or fidgety/restless 0 - - 0 -  Suicidal thoughts 0 - - 0 -  PHQ-9 Score 0 - - 0 -  Difficult doing work/chores Not difficult at all - - - -    The patient does not have a history of falls. I did complete a risk assessment for falls. A plan of care for falls was documented.   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   atorvastatin (LIPITOR) 40 MG tablet TAKE 1 TABLET BY MOUTH EVERY DAY   glucose blood test strip 1 each by Other route as needed for other. Use as instructed   metFORMIN (GLUCOPHAGE) 1000 MG tablet TAKE 1 TABLET (1,000 MG TOTAL) BY MOUTH 2 (TWO) TIMES DAILY WITH A MEAL.   metroNIDAZOLE (METROGEL) 0.75 % gel APPLY TO AFFECTED AREAS FACE 1-2 TIMES DAILY  AS NEEDED   No current facility-administered medications on file prior to visit.    Allergies:  No Known Allergies  Social History:  Social History   Socioeconomic History   Marital status: Single    Spouse name: Not on file   Number of children: Not on file   Years of education: Not on file   Highest education level: 9th grade  Occupational History   Not on file  Tobacco Use   Smoking status: Former    Types: Cigarettes    Quit date: 11/02/1974    Years since quitting: 46.4   Smokeless tobacco: Current    Types: Chew  Vaping Use   Vaping Use: Never used  Substance and Sexual Activity   Alcohol use: No    Alcohol/week: 0.0 standard drinks    Drug use: No   Sexual activity: Yes    Birth control/protection: None  Other Topics Concern   Not on file  Social History Narrative   Not on file   Social Determinants of Health   Financial Resource Strain: Low Risk    Difficulty of Paying Living Expenses: Not hard at all  Food Insecurity: No Food Insecurity   Worried About Charity fundraiser in the Last Year: Never true   Ran Out of Food in the Last Year: Never true  Transportation Needs: No Transportation Needs   Lack of Transportation (Medical): No   Lack of Transportation (Non-Medical): No  Physical Activity: Inactive   Days of Exercise per Week: 0 days   Minutes of Exercise per Session: 0 min  Stress: No Stress Concern Present   Feeling of Stress : Not at all  Social Connections: Not on file  Intimate Partner Violence: Not on file   Social History   Tobacco Use  Smoking Status Former   Types: Cigarettes   Quit date: 11/02/1974   Years since quitting: 46.4  Smokeless Tobacco Current   Types: Chew   Social History   Substance and Sexual Activity  Alcohol Use No   Alcohol/week: 0.0 standard drinks    Family History:  Family History  Problem Relation Age of Onset   Diabetes Mother    Cancer Sister        lung   Diabetes Brother    Diabetes Brother    Diabetes Brother     Past medical history, surgical history, medications, allergies, family history and social history reviewed with patient today and changes made to appropriate areas of the chart.   Review of Systems  HENT:         Denies vision changes.  Eyes:  Negative for blurred vision and double vision.  Respiratory:  Negative for shortness of breath.   Cardiovascular:  Negative for chest pain, palpitations and leg swelling.  Neurological:  Negative for dizziness, tingling and headaches.  Endo/Heme/Allergies:  Negative for polydipsia.       Denies Polyuria  All other ROS negative except what is listed above and in the HPI.      Objective:     BP 122/65    Pulse 64    Temp 98.5 F (36.9 C) (Oral)    Ht 5' 9" (1.753 m)    Wt 203 lb 9.6 oz (92.4 kg)    SpO2 94%    BMI 30.07 kg/m   Wt Readings from Last 3 Encounters:  04/19/21 203 lb 9.6 oz (92.4 kg)  12/23/20 205 lb (93 kg)  12/06/20 210 lb 3.2 oz (95.3 kg)  Physical Exam Vitals and nursing note reviewed.  Constitutional:      General: He is not in acute distress.    Appearance: Normal appearance. He is not ill-appearing, toxic-appearing or diaphoretic.  HENT:     Head: Normocephalic.     Right Ear: Tympanic membrane, ear canal and external ear normal.     Left Ear: Tympanic membrane, ear canal and external ear normal.     Nose: Nose normal. No congestion or rhinorrhea.     Mouth/Throat:     Mouth: Mucous membranes are moist.  Eyes:     General:        Right eye: No discharge.        Left eye: No discharge.     Extraocular Movements: Extraocular movements intact.     Conjunctiva/sclera: Conjunctivae normal.     Pupils: Pupils are equal, round, and reactive to light.  Cardiovascular:     Rate and Rhythm: Normal rate and regular rhythm.     Heart sounds: No murmur heard. Pulmonary:     Effort: Pulmonary effort is normal. No respiratory distress.     Breath sounds: Normal breath sounds. No wheezing, rhonchi or rales.  Abdominal:     General: Abdomen is flat. Bowel sounds are normal. There is no distension.     Palpations: Abdomen is soft.     Tenderness: There is no abdominal tenderness. There is no guarding.  Musculoskeletal:     Cervical back: Normal range of motion and neck supple.  Skin:    General: Skin is warm and dry.     Capillary Refill: Capillary refill takes less than 2 seconds.  Neurological:     General: No focal deficit present.     Mental Status: He is alert and oriented to person, place, and time.     Cranial Nerves: No cranial nerve deficit.     Motor: No weakness.     Deep Tendon Reflexes: Reflexes normal.  Psychiatric:        Mood and  Affect: Mood normal.        Behavior: Behavior normal.        Thought Content: Thought content normal.        Judgment: Judgment normal.    Results for orders placed or performed in visit on 12/06/20  Comprehensive metabolic panel  Result Value Ref Range   Glucose 203 (H) 65 - 99 mg/dL   BUN 18 8 - 27 mg/dL   Creatinine, Ser 1.16 0.76 - 1.27 mg/dL   eGFR 64 >59 mL/min/1.73   BUN/Creatinine Ratio 16 10 - 24   Sodium 136 134 - 144 mmol/L   Potassium 4.4 3.5 - 5.2 mmol/L   Chloride 98 96 - 106 mmol/L   CO2 18 (L) 20 - 29 mmol/L   Calcium 8.9 8.6 - 10.2 mg/dL   Total Protein 6.7 6.0 - 8.5 g/dL   Albumin 3.9 3.7 - 4.7 g/dL   Globulin, Total 2.8 1.5 - 4.5 g/dL   Albumin/Globulin Ratio 1.4 1.2 - 2.2   Bilirubin Total 0.6 0.0 - 1.2 mg/dL   Alkaline Phosphatase 72 44 - 121 IU/L   AST 34 0 - 40 IU/L   ALT 22 0 - 44 IU/L  Uric acid  Result Value Ref Range   Uric Acid 6.2 3.8 - 8.4 mg/dL  Lyme Disease Serology w/Reflex  Result Value Ref Range   Lyme Total Antibody EIA Negative Negative  Babesia microti Antibody Panel  Result Value Ref Range  Babesia microti IgM <1:10 Neg:<1:10   Babesia microti IgG <1:10 Neg:<1:10  Rocky mtn spotted fvr abs pnl(IgG+IgM)  Result Value Ref Range   RMSF IgG Positive (A) Negative   RMSF IgM 0.30 0.00 - 1.24 index  Ehrlichia Antibody Panel  Result Value Ref Range   E.Chaffeensis (HME) IgG Negative Neg:<1:64   E. 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)   Relevant Orders   HgB A1c   Microalbumin, Urine Waived   Hypertension associated with diabetes ([place])   [name] ([place])     Other   Pure hypercholesterolemia   Relevant Orders    Lipid panel   Other Visit Diagnoses     Annual physical exam    -  Primary   Relevant Orders   TSH   PSA   Lipid panel   CBC with Differential/Platelet   Comprehensive metabolic panel   Urinalysis, Routine w reflex microscopic        Discussed aspirin prophylaxis for myocardial infarction prevention and decision was made to continue ASA  LABORATORY TESTING:  Health maintenance labs ordered today as discussed above.   The natural history of prostate cancer and ongoing controversy regarding screening and potential treatment outcomes of prostate cancer has been discussed with the patient. The meaning of a false positive PSA and a false negative PSA has been discussed. He indicates understanding of the limitations of this screening test and wishes to proceed with screening PSA testing.   IMMUNIZATIONS:   - Tdap: Tetanus vaccination status reviewed: last tetanus booster within 10 years. - Influenza: Up to date - Pneumovax: Up to date - Prevnar: Up to date - COVID: Up to date - HPV: Not applicable - Shingrix vaccine:  Discussed at visit today  SCREENING: - Colonoscopy: Not applicable  Discussed with patient purpose of the colonoscopy is to detect colon cancer at curable precancerous or early stages   - AAA Screening: Not applicable  -Hearing Test: Not applicable  -Spirometry: Not applicable   PATIENT COUNSELING:    Sexuality: Discussed sexually transmitted diseases, partner selection, use of condoms, avoidance of unintended pregnancy  and contraceptive alternatives.   Advised to avoid cigarette smoking.  I discussed with the patient that most people either abstain from alcohol or drink within safe limits (<=14/week and <=4 drinks/occasion for males, <=7/weeks and <= 3 drinks/occasion for females) and that the risk for alcohol disorders and other health effects rises proportionally with the number of drinks per week and how often a drinker exceeds daily limits.  Discussed  cessation/primary prevention of drug use and availability of treatment for abuse.   Diet: Encouraged to adjust caloric intake to maintain  or achieve ideal body weight, to reduce intake of dietary saturated fat and total fat, to limit sodium intake by avoiding high sodium foods and not adding table salt, and to maintain adequate dietary potassium and calcium preferably from fresh fruits, vegetables, and low-fat dairy products.    stressed the importance of regular exercise  Injury prevention: Discussed safety belts, safety helmets, smoke detector, smoking near bedding or upholstery.   Dental health: Discussed importance of  regular tooth brushing, flossing, and dental visits.   Follow up plan: NEXT PREVENTATIVE PHYSICAL DUE IN 1 YEAR. Return in about 3 months (around 07/18/2021) for HTN, HLD, DM2 FU.

## 2021-04-19 ENCOUNTER — Other Ambulatory Visit: Payer: Self-pay

## 2021-04-19 ENCOUNTER — Ambulatory Visit (INDEPENDENT_AMBULATORY_CARE_PROVIDER_SITE_OTHER): Payer: Medicare PPO | Admitting: Nurse Practitioner

## 2021-04-19 ENCOUNTER — Encounter: Payer: Self-pay | Admitting: Nurse Practitioner

## 2021-04-19 VITALS — BP 122/65 | HR 64 | Temp 98.5°F | Ht 69.0 in | Wt 203.6 lb

## 2021-04-19 DIAGNOSIS — Z0000 Encounter for general adult medical examination without abnormal findings: Secondary | ICD-10-CM | POA: Diagnosis not present

## 2021-04-19 DIAGNOSIS — E1159 Type 2 diabetes mellitus with other circulatory complications: Secondary | ICD-10-CM | POA: Diagnosis not present

## 2021-04-19 DIAGNOSIS — E1122 Type 2 diabetes mellitus with diabetic chronic kidney disease: Secondary | ICD-10-CM | POA: Diagnosis not present

## 2021-04-19 DIAGNOSIS — D692 Other nonthrombocytopenic purpura: Secondary | ICD-10-CM | POA: Diagnosis not present

## 2021-04-19 DIAGNOSIS — I152 Hypertension secondary to endocrine disorders: Secondary | ICD-10-CM

## 2021-04-19 DIAGNOSIS — E7800 Pure hypercholesterolemia, unspecified: Secondary | ICD-10-CM

## 2021-04-19 DIAGNOSIS — N181 Chronic kidney disease, stage 1: Secondary | ICD-10-CM | POA: Diagnosis not present

## 2021-04-19 DIAGNOSIS — I129 Hypertensive chronic kidney disease with stage 1 through stage 4 chronic kidney disease, or unspecified chronic kidney disease: Secondary | ICD-10-CM | POA: Diagnosis not present

## 2021-04-19 LAB — URINALYSIS, ROUTINE W REFLEX MICROSCOPIC
Bilirubin, UA: NEGATIVE
Glucose, UA: NEGATIVE
Ketones, UA: NEGATIVE
Leukocytes,UA: NEGATIVE
Nitrite, UA: NEGATIVE
Protein,UA: NEGATIVE
RBC, UA: NEGATIVE
Specific Gravity, UA: 1.025 (ref 1.005–1.030)
Urobilinogen, Ur: 0.2 mg/dL (ref 0.2–1.0)
pH, UA: 5.5 (ref 5.0–7.5)

## 2021-04-20 LAB — COMPREHENSIVE METABOLIC PANEL
ALT: 17 IU/L (ref 0–44)
AST: 25 IU/L (ref 0–40)
Albumin/Globulin Ratio: 1.3 (ref 1.2–2.2)
Albumin: 3.9 g/dL (ref 3.7–4.7)
Alkaline Phosphatase: 80 IU/L (ref 44–121)
BUN/Creatinine Ratio: 15 (ref 10–24)
BUN: 15 mg/dL (ref 8–27)
Bilirubin Total: 0.4 mg/dL (ref 0.0–1.2)
CO2: 26 mmol/L (ref 20–29)
Calcium: 9.3 mg/dL (ref 8.6–10.2)
Chloride: 104 mmol/L (ref 96–106)
Creatinine, Ser: 0.97 mg/dL (ref 0.76–1.27)
Globulin, Total: 2.9 g/dL (ref 1.5–4.5)
Glucose: 113 mg/dL — ABNORMAL HIGH (ref 70–99)
Potassium: 5.4 mmol/L — ABNORMAL HIGH (ref 3.5–5.2)
Sodium: 142 mmol/L (ref 134–144)
Total Protein: 6.8 g/dL (ref 6.0–8.5)
eGFR: 79 mL/min/{1.73_m2} (ref >59)

## 2021-04-20 LAB — CBC WITH DIFFERENTIAL/PLATELET
Basophils Absolute: 0 10*3/uL (ref 0.0–0.2)
Basos: 1 %
EOS (ABSOLUTE): 0.3 10*3/uL (ref 0.0–0.4)
Eos: 6 %
Hematocrit: 40 % (ref 37.5–51.0)
Hemoglobin: 13.4 g/dL (ref 13.0–17.7)
Immature Grans (Abs): 0 10*3/uL (ref 0.0–0.1)
Immature Granulocytes: 0 %
Lymphocytes Absolute: 1.6 10*3/uL (ref 0.7–3.1)
Lymphs: 33 %
MCH: 31 pg (ref 26.6–33.0)
MCHC: 33.5 g/dL (ref 31.5–35.7)
MCV: 93 fL (ref 79–97)
Monocytes Absolute: 0.5 10*3/uL (ref 0.1–0.9)
Monocytes: 10 %
Neutrophils Absolute: 2.5 10*3/uL (ref 1.4–7.0)
Neutrophils: 50 %
Platelets: 184 10*3/uL (ref 150–450)
RBC: 4.32 x10E6/uL (ref 4.14–5.80)
RDW: 12.5 % (ref 11.6–15.4)
WBC: 4.9 10*3/uL (ref 3.4–10.8)

## 2021-04-20 LAB — PSA: Prostate Specific Ag, Serum: 4.8 ng/mL — ABNORMAL HIGH (ref 0.0–4.0)

## 2021-04-20 LAB — LIPID PANEL
Chol/HDL Ratio: 3.6 ratio (ref 0.0–5.0)
Cholesterol, Total: 135 mg/dL (ref 100–199)
HDL: 37 mg/dL — ABNORMAL LOW (ref >39)
LDL Chol Calc (NIH): 81 mg/dL (ref 0–99)
Triglycerides: 91 mg/dL (ref 0–149)
VLDL Cholesterol Cal: 17 mg/dL (ref 5–40)

## 2021-04-20 LAB — TSH: TSH: 3.9 u[IU]/mL (ref 0.450–4.500)

## 2021-04-20 LAB — HEMOGLOBIN A1C
Est. average glucose Bld gHb Est-mCnc: 143 mg/dL
Hgb A1c MFr Bld: 6.6 % — ABNORMAL HIGH (ref 4.8–5.6)

## 2021-04-20 NOTE — Progress Notes (Signed)
Please let patient know that his lab work looks good.  PSA remains well controlled at 4.8.  A1c is well controlled at 6.6.  Please let me know if he has any questions. I will see him at our next visit.

## 2021-04-22 ENCOUNTER — Other Ambulatory Visit: Payer: Self-pay | Admitting: Nurse Practitioner

## 2021-04-22 NOTE — Telephone Encounter (Signed)
Requested Prescriptions  Pending Prescriptions Disp Refills   metFORMIN (GLUCOPHAGE) 1000 MG tablet [Pharmacy Med Name: METFORMIN HCL 1,000 MG TABLET] 180 tablet 0    Sig: TAKE 1 TABLET (1,000 MG TOTAL) BY MOUTH 2 (TWO) TIMES DAILY WITH A MEAL.     Endocrinology:  Diabetes - Biguanides Passed - 04/22/2021  9:18 AM      Passed - Cr in normal range and within 360 days    Creatinine, Ser  Date Value Ref Range Status  04/19/2021 0.97 0.76 - 1.27 mg/dL Final         Passed - HBA1C is between 0 and 7.9 and within 180 days    Hemoglobin A1C  Date Value Ref Range Status  12/01/2015 7.4  Final   HB A1C (BAYER DCA - WAIVED)  Date Value Ref Range Status  04/20/2020 6.9 <7.0 % Final    Comment:                                          Diabetic Adult            <7.0                                       Healthy Adult        4.3 - 5.7                                                           (DCCT/NGSP) American Diabetes Association's Summary of Glycemic Recommendations for Adults with Diabetes: Hemoglobin A1c <7.0%. More stringent glycemic goals (A1c <6.0%) may further reduce complications at the cost of increased risk of hypoglycemia.    Hgb A1c MFr Bld  Date Value Ref Range Status  04/19/2021 6.6 (H) 4.8 - 5.6 % Final    Comment:             Prediabetes: 5.7 - 6.4          Diabetes: >6.4          Glycemic control for adults with diabetes: <7.0          Passed - eGFR in normal range and within 360 days    GFR calc Af Amer  Date Value Ref Range Status  04/20/2020 66 >59 mL/min/1.73 Final    Comment:    **In accordance with recommendations from the NKF-ASN Task force,**   [facility] is in the process of updating its eGFR calculation to the   2021 CKD-EPI creatinine equation that estimates kidney function   without a race variable.    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

## 2021-05-31 ENCOUNTER — Other Ambulatory Visit: Payer: Self-pay | Admitting: Family Medicine

## 2021-05-31 DIAGNOSIS — E7800 Pure hypercholesterolemia, unspecified: Secondary | ICD-10-CM

## 2021-05-31 NOTE — Telephone Encounter (Signed)
Requested Prescriptions  Pending Prescriptions Disp Refills   atorvastatin (LIPITOR) 40 MG tablet [Pharmacy Med Name: ATORVASTATIN 40 MG TABLET] 90 tablet 1    Sig: TAKE 1 TABLET BY MOUTH EVERY DAY     Cardiovascular:  Antilipid - Statins Failed - 05/31/2021  1:38 AM      Failed - Lipid Panel in normal range within the last 12 months    Cholesterol, Total  Date Value Ref Range Status  04/19/2021 135 100 - 199 mg/dL Final   Cholesterol Piccolo, Waived  Date Value Ref Range Status  10/30/2018 137 <200 mg/dL Final    Comment:                            Desirable                <200                         Borderline High      200- 239                         High                     >239    LDL Chol Calc (NIH)  Date Value Ref Range Status  04/19/2021 81 0 - 99 mg/dL Final   HDL  Date Value Ref Range Status  04/19/2021 37 (L) >39 mg/dL Final   Triglycerides  Date Value Ref Range Status  04/19/2021 91 0 - 149 mg/dL Final   Triglycerides Piccolo,Waived  Date Value Ref Range Status  10/30/2018 129 <150 mg/dL Final    Comment:                            Normal                   <150                         Borderline High     150 - 199                         High                200 - 499                         Very High                >499          Passed - Patient is not pregnant      Passed - Valid encounter within last 12 months    Recent Outpatient Visits          1 month ago Annual physical exam   [facility] Jon Billings, NP   5 months ago Acute pain of both knees   [place], Megan P, DO   7 months ago 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

## 2021-07-17 NOTE — Progress Notes (Signed)
? ?BP 121/69   Pulse 63   Temp 97.8 ?F (36.6 ?C) (Oral)   Wt 205 lb 12.8 oz (93.4 kg)   SpO2 96%   BMI 30.39 kg/m?   ? ?Subjective:  ? ? [name], male    DOB: 1941-02-17, 81 y.o.   MRN: [id number] ? ?HPI: ?Jack Crane is a 81 y.o. male ? ?Chief Complaint  ?Patient presents with  ? Diabetes  ? Hyperlipidemia  ? Hypertension  ? ?HYPERTENSION / HYPERLIPIDEMIA ?Satisfied with current treatment? yes ?Duration of hypertension: years ?BP monitoring frequency: not checking ?BP range:  ?BP medication side effects: no ?Past BP meds: none ?Duration of hyperlipidemia: years ?Cholesterol medication side effects: no ?Cholesterol supplements: none ?Past cholesterol medications: atorvastain (lipitor) ?Medication compliance: excellent compliance ?Aspirin: yes ?Recent stressors: no ?Recurrent headaches: no ?Visual changes: no ?Palpitations: no ?Dyspnea: no ?Chest pain: no ?Lower extremity edema: no ?Dizzy/lightheaded: no ? ?DIABETES ?Hypoglycemic episodes:no ?Polydipsia/polyuria: no ?Visual disturbance: no ?Chest pain: no ?Paresthesias: no ?Glucose Monitoring: yes ? Accucheck frequency: daily ? Fasting glucose: 130-140 ? Post prandial: ? Evening: ? Before meals: ?Taking Insulin?: no ? Long acting insulin: ? Short acting insulin: ?Blood Pressure Monitoring: not checking ?Retinal Examination: Up to Date ?Foot Exam: Up to Date ?Diabetic Education: Not Completed ?Pneumovax: Up to Date ?Influenza: Up to Date ?Aspirin: yes ? ? ?Relevant past medical, surgical, family and social history reviewed and updated as indicated. Interim medical history since our last visit reviewed. ?Allergies and medications reviewed and updated. ? ?Review of Systems  ?Eyes:  Negative for visual disturbance.  ?Respiratory:  Negative for chest tightness and shortness of breath.   ?Cardiovascular:  Negative for chest pain, palpitations and leg swelling.  ?Endocrine: Negative for polydipsia and polyuria.  ?Neurological:  Negative for  dizziness, light-headedness, numbness and headaches.  ? ?Per HPI unless specifically indicated above ? ?   ?Objective:  ?  ?BP 121/69   Pulse 63   Temp 97.8 ?F (36.6 ?C) (Oral)   Wt 205 lb 12.8 oz (93.4 kg)   SpO2 96%   BMI 30.39 kg/m?   ?Wt Readings from Last 3 Encounters:  ?07/18/21 205 lb 12.8 oz (93.4 kg)  ?04/19/21 203 lb 9.6 oz (92.4 kg)  ?12/23/20 205 lb (93 kg)  ?  ?Physical Exam ?Vitals and nursing note reviewed.  ?Constitutional:   ?   General: He is not in acute distress. ?   Appearance: Normal appearance. He is not ill-appearing, toxic-appearing or diaphoretic.  ?HENT:  ?   Head: Normocephalic.  ?   Right Ear: External ear normal.  ?   Left Ear: External ear normal.  ?   Nose: Nose normal. No congestion or rhinorrhea.  ?   Mouth/Throat:  ?   Mouth: Mucous membranes are moist.  ?Eyes:  ?   General:     ?   Right eye: No discharge.     ?   Left eye: No discharge.  ?   Extraocular Movements: Extraocular movements intact.  ?   Conjunctiva/sclera: Conjunctivae normal.  ?   Pupils: Pupils are equal, round, and reactive to light.  ?Cardiovascular:  ?   Rate and Rhythm: Normal rate and regular rhythm.  ?   Heart sounds: No murmur heard. ?Pulmonary:  ?   Effort: Pulmonary effort is normal. No respiratory distress.  ?   Breath sounds: Normal breath sounds. No wheezing, rhonchi or rales.  ?Abdominal:  ?   General: Abdomen is flat. Bowel sounds are  normal.  ?Musculoskeletal:  ?   Cervical back: Normal range of motion and neck supple.  ?Skin: ?   General: Skin is warm and dry.  ?   Capillary Refill: Capillary refill takes less than 2 seconds.  ?Neurological:  ?   General: No focal deficit present.  ?   Mental Status: He is alert and oriented to person, place, and time.  ?Psychiatric:     ?   Mood and Affect: Mood normal.     ?   Behavior: Behavior normal.     ?   Thought Content: Thought content normal.     ?   Judgment: Judgment normal.  ? ? ?Results for orders placed or performed in visit on 04/20/21  ?HM  DIABETES EYE EXAM  ?Result Value Ref Range  ? HM Diabetic Eye Exam No Retinopathy No Retinopathy  ? ?   ?Assessment & Plan:  ? ?Problem List Items Addressed This Visit   ? ?  ? Cardiovascular and Mediastinum  ? 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  Chronic.  Controlled.  Continue with current medication regimen of Metformin 101m BID.  Refills sent today.  Labs ordered today.  Return to clinic in 3 months for reevaluation.  Call sooner if concerns arise.  ? ?  ?  ? Relevant Medications  ? metFORMIN (GLUCOPHAGE) 1000 MG tablet  ? Other Relevant Orders  ? Comp Met (CMET)  ? HgB A1c  ? Microalbumin, Urine Waived  ? Hypertension associated with diabetes ([place]  ?  Chronic.  Controlled without medication.  Labs ordered today.  Return to clinic in 3 months for reevaluation.  Call sooner if concerns arise.  ? ?  ?  ? Relevant Medications  ? metFORMIN (GLUCOPHAGE) 1000 MG tablet  ? Other Relevant Orders  ? Comp Met (CMET)  ? [name] [place]  ?  Reassured patient.  Recommend caution when working outside to prevent bruising and skin tearing. ?  ?  ?  ? Other  ? Pure hypercholesterolemia  ?  Chronic.  Controlled with Atorvastatin 440mdaily.  Labs ordered today.  Return to clinic in 3 months for reevaluation.  Call sooner if concerns arise.  ? ?  ?  ? Relevant Orders  ? Lipid Profile  ?  ? ?Follow up plan: ?Return in about 3 months (around 10/18/2021) for HTN, HLD, DM2 FU. ? ? ? ? ? ? ?

## 2021-07-18 ENCOUNTER — Encounter: Payer: Self-pay | Admitting: Nurse Practitioner

## 2021-07-18 ENCOUNTER — Other Ambulatory Visit: Payer: Self-pay

## 2021-07-18 ENCOUNTER — Ambulatory Visit: Payer: Medicare PPO | Admitting: Nurse Practitioner

## 2021-07-18 VITALS — BP 121/69 | HR 63 | Temp 97.8°F | Wt 205.8 lb

## 2021-07-18 DIAGNOSIS — I152 Hypertension secondary to endocrine disorders: Secondary | ICD-10-CM

## 2021-07-18 DIAGNOSIS — N181 Chronic kidney disease, stage 1: Secondary | ICD-10-CM | POA: Diagnosis not present

## 2021-07-18 DIAGNOSIS — D692 Other nonthrombocytopenic purpura: Secondary | ICD-10-CM | POA: Diagnosis not present

## 2021-07-18 DIAGNOSIS — E7800 Pure hypercholesterolemia, unspecified: Secondary | ICD-10-CM

## 2021-07-18 DIAGNOSIS — E1122 Type 2 diabetes mellitus with diabetic chronic kidney disease: Secondary | ICD-10-CM

## 2021-07-18 DIAGNOSIS — E1159 Type 2 diabetes mellitus with other circulatory complications: Secondary | ICD-10-CM | POA: Diagnosis not present

## 2021-07-18 DIAGNOSIS — I129 Hypertensive chronic kidney disease with stage 1 through stage 4 chronic kidney disease, or unspecified chronic kidney disease: Secondary | ICD-10-CM | POA: Diagnosis not present

## 2021-07-18 LAB — MICROALBUMIN, URINE WAIVED
Creatinine, Urine Waived: 200 mg/dL (ref 10–300)
Microalb, Ur Waived: 10 mg/L (ref 0–19)
Microalb/Creat Ratio: <30 mg/g (ref <30)

## 2021-07-18 MED ORDER — METFORMIN HCL 1000 MG PO TABS
1000.0000 mg | ORAL_TABLET | Freq: Two times a day (BID) | ORAL | 1 refills | Status: DC
Start: 2021-07-18 — End: 2022-01-12

## 2021-07-18 NOTE — Assessment & Plan Note (Signed)
Chronic.  Controlled with Atorvastatin '40mg'$  daily.  Labs ordered today.  Return to clinic in 3 months for reevaluation.  Call sooner if concerns arise.  ? ?

## 2021-07-18 NOTE — Assessment & Plan Note (Signed)
Chronic.  Controlled without medication.  Labs ordered today.  Return to clinic in 3 months for reevaluation.  Call sooner if concerns arise.  ? ?

## 2021-07-18 NOTE — Assessment & Plan Note (Signed)
Reassured patient.  Recommend caution when working outside to prevent bruising and skin tearing. 

## 2021-07-18 NOTE — Assessment & Plan Note (Signed)
Chronic.  Controlled.  Continue with current medication regimen of Metformin '1000mg'$  BID.  Refills sent today.  Labs ordered today.  Return to clinic in 3 months for reevaluation.  Call sooner if concerns arise.  ? ?

## 2021-07-19 ENCOUNTER — Telehealth: Payer: Self-pay | Admitting: Nurse Practitioner

## 2021-07-19 LAB — COMPREHENSIVE METABOLIC PANEL
ALT: 18 IU/L (ref 0–44)
AST: 25 IU/L (ref 0–40)
Albumin/Globulin Ratio: 1.6 (ref 1.2–2.2)
Albumin: 4 g/dL (ref 3.7–4.7)
Alkaline Phosphatase: 74 IU/L (ref 44–121)
BUN/Creatinine Ratio: 14 (ref 10–24)
BUN: 14 mg/dL (ref 8–27)
Bilirubin Total: 0.4 mg/dL (ref 0.0–1.2)
CO2: 25 mmol/L (ref 20–29)
Calcium: 8.7 mg/dL (ref 8.6–10.2)
Chloride: 104 mmol/L (ref 96–106)
Creatinine, Ser: 0.98 mg/dL (ref 0.76–1.27)
Globulin, Total: 2.5 g/dL (ref 1.5–4.5)
Glucose: 126 mg/dL — ABNORMAL HIGH (ref 70–99)
Potassium: 4.4 mmol/L (ref 3.5–5.2)
Sodium: 142 mmol/L (ref 134–144)
Total Protein: 6.5 g/dL (ref 6.0–8.5)
eGFR: 78 mL/min/{1.73_m2} (ref >59)

## 2021-07-19 LAB — LIPID PANEL
Chol/HDL Ratio: 3.5 ratio (ref 0.0–5.0)
Cholesterol, Total: 126 mg/dL (ref 100–199)
HDL: 36 mg/dL — ABNORMAL LOW (ref >39)
LDL Chol Calc (NIH): 74 mg/dL (ref 0–99)
Triglycerides: 78 mg/dL (ref 0–149)
VLDL Cholesterol Cal: 16 mg/dL (ref 5–40)

## 2021-07-19 LAB — HEMOGLOBIN A1C
Est. average glucose Bld gHb Est-mCnc: 143 mg/dL
Hgb A1c MFr Bld: 6.6 % — ABNORMAL HIGH (ref 4.8–5.6)

## 2021-07-19 NOTE — Telephone Encounter (Signed)
See result notes. 

## 2021-07-19 NOTE — Telephone Encounter (Signed)
Pt called for lab results. Please call back. ?

## 2021-07-19 NOTE — Progress Notes (Signed)
Please let patient know that his lab work looks good.  No concerns at this time.  His a1c remains well controlled at 6.6.  Continue with current medication regimen.  We will continue to check at future visits.

## 2021-07-26 ENCOUNTER — Telehealth: Payer: Self-pay

## 2021-07-26 NOTE — Telephone Encounter (Signed)
LVM for patient advising his appt has been rescheduled from 08/31/21 to 09/20/21 at 11:15 am. ?Lurlean Horns., RMA ?

## 2021-08-31 ENCOUNTER — Ambulatory Visit: Payer: Medicare PPO | Admitting: Dermatology

## 2021-09-20 ENCOUNTER — Ambulatory Visit: Payer: Medicare PPO | Admitting: Dermatology

## 2021-09-20 DIAGNOSIS — L820 Inflamed seborrheic keratosis: Secondary | ICD-10-CM

## 2021-09-20 DIAGNOSIS — L570 Actinic keratosis: Secondary | ICD-10-CM | POA: Diagnosis not present

## 2021-09-20 DIAGNOSIS — L578 Other skin changes due to chronic exposure to nonionizing radiation: Secondary | ICD-10-CM | POA: Diagnosis not present

## 2021-09-20 DIAGNOSIS — D692 Other nonthrombocytopenic purpura: Secondary | ICD-10-CM | POA: Diagnosis not present

## 2021-09-20 NOTE — Progress Notes (Signed)
   Follow-Up Visit   Subjective  Jack Crane is a 81 y.o. male who presents for the following:  Follow-up AK's and other spots. The patient has spots, moles and lesions to be evaluated, some may be new or changing and the patient has concerns that these could be cancer.  The following portions of the chart were reviewed this encounter and updated as appropriate:   Tobacco  Allergies  Meds  Problems  Med Hx  Surg Hx  Fam Hx     Review of Systems:  No other skin or systemic complaints except as noted in HPI or Assessment and Plan.  Objective  Well appearing patient in no apparent distress; mood and affect are within normal limits.  A focused examination was performed including face, arms. Relevant physical exam findings are noted in the Assessment and Plan.  Arms Purpura   Face, ears (8) Erythematous thin papules/macules with gritty scale.   Right Forearm - Posterior Erythematous stuck-on, waxy papule or plaque   Assessment & Plan   Actinic Damage - chronic, secondary to cumulative UV radiation exposure/sun exposure over time - diffuse scaly erythematous macules with underlying dyspigmentation - Recommend daily broad spectrum sunscreen SPF 30+ to sun-exposed areas, reapply every 2 hours as needed.  - Recommend staying in the shade or wearing long sleeves, sun glasses (UVA+UVB protection) and wide brim hats (4-inch brim around the entire circumference of the hat). - Call for new or changing lesions.  Purpura ([place]) Arms Purpura - Chronic; persistent and recurrent.  Treatable, but not curable. - Violaceous macules and patches - Benign - Related to trauma, age, sun damage and/or use of blood thinners, chronic use of topical and/or oral steroids - Observe - Can use OTC arnica containing moisturizer such as Dermend Bruise Formula if desired - Call for worsening or other concerns  AK (actinic keratosis) (8) Face, ears Destruction of lesion - Face, ears Complexity:  simple   Destruction method: cryotherapy   Informed consent: discussed and consent obtained   Timeout:  patient name, date of birth, surgical site, and procedure verified Lesion destroyed using liquid nitrogen: Yes   Region frozen until ice ball extended beyond lesion: Yes   Outcome: patient tolerated procedure well with no complications   Post-procedure details: wound care instructions given    Inflamed seborrheic keratosis Right Forearm - Posterior Symptomatic, irritating, patient would like treated. Destruction of lesion - Right Forearm - Posterior Complexity: simple   Destruction method: cryotherapy   Informed consent: discussed and consent obtained   Timeout:  patient name, date of birth, surgical site, and procedure verified Lesion destroyed using liquid nitrogen: Yes   Region frozen until ice ball extended beyond lesion: Yes   Outcome: patient tolerated procedure well with no complications   Post-procedure details: wound care instructions given    Return in about 1 year (around 09/21/2022).  I, Ashok Cordia, CMA, am acting as scribe for Sarina Ser, MD . Documentation: I have reviewed the above documentation for accuracy and completeness, and I agree with the above.  Sarina Ser, MD

## 2021-09-20 NOTE — Patient Instructions (Signed)

## 2021-09-24 ENCOUNTER — Encounter: Payer: Self-pay | Admitting: Dermatology

## 2021-10-17 NOTE — Progress Notes (Signed)
BP 129/80   Pulse (!) 53   Temp 97.6 F (36.4 C) (Oral)   Wt 206 lb 3.2 oz (93.5 kg)   SpO2 96%   BMI 30.45 kg/m    Subjective:    [name], male    DOB: [birth year], 81 y.o.   MRN: [id number]  HPI: Jack Crane is a 81 y.o. male  Chief Complaint  Patient presents with   Hypertension    3 month follow up   Hyperlipidemia   Diabetes   HYPERTENSION / [place] Satisfied with current treatment? yes Duration of hypertension: years BP monitoring frequency: not checking BP range:  BP medication side effects: no Past BP meds: none Duration of hyperlipidemia: years Cholesterol medication side effects: no Cholesterol supplements: none Past cholesterol medications: atorvastain (lipitor) Medication compliance: excellent compliance Aspirin: yes Recent stressors: no Recurrent headaches: no Visual changes: no Palpitations: no Dyspnea: no Chest pain: no Lower extremity edema: no Dizzy/lightheaded: no  DIABETES Hypoglycemic episodes:no Polydipsia/polyuria: no Visual disturbance: no Chest pain: no Paresthesias: no Glucose Monitoring: yes  Accucheck frequency: daily  Fasting glucose: <160  Post prandial:  Evening:  Before meals: Taking Insulin?: no  Long acting insulin:  Short acting insulin: Blood Pressure Monitoring: not checking Retinal Examination: Up to Date- has it scheduled for next month Foot Exam: Up to Date Diabetic Education: Not Completed Pneumovax: Up to Date Influenza: Up to Date Aspirin: yes   Relevant past medical, surgical, family and social history reviewed and updated as indicated. Interim medical history since our last visit reviewed. Allergies and medications reviewed and updated.  Review of Systems  Eyes:  Negative for visual disturbance.  Respiratory:  Negative for chest tightness and shortness of breath.   Cardiovascular:  Negative for chest pain, palpitations and leg swelling.  Endocrine: Negative for  polydipsia and polyuria.  Neurological:  Negative for dizziness, light-headedness, numbness and headaches.    Per HPI unless specifically indicated above     Objective:    BP 129/80   Pulse (!) 53   Temp 97.6 F (36.4 C) (Oral)   Wt 206 lb 3.2 oz (93.5 kg)   SpO2 96%   BMI 30.45 kg/m   Wt Readings from Last 3 Encounters:  10/18/21 206 lb 3.2 oz (93.5 kg)  07/18/21 205 lb 12.8 oz (93.4 kg)  04/19/21 203 lb 9.6 oz (92.4 kg)    Physical Exam Vitals and nursing note reviewed.  Constitutional:      General: He is not in acute distress.    Appearance: Normal appearance. He is not ill-appearing, toxic-appearing or diaphoretic.  HENT:     Head: Normocephalic.     Right Ear: External ear normal.     Left Ear: External ear normal.     Nose: Nose normal. No congestion or rhinorrhea.     Mouth/Throat:     Mouth: Mucous membranes are moist.  Eyes:     General:        Right eye: No discharge.        Left eye: No discharge.     Extraocular Movements: Extraocular movements intact.     Conjunctiva/sclera: Conjunctivae normal.     Pupils: Pupils are equal, round, and reactive to light.  Cardiovascular:     Rate and Rhythm: Normal rate and regular rhythm.     Heart sounds: No murmur heard. Pulmonary:     Effort: Pulmonary effort is normal. No respiratory distress.     Breath sounds: Normal breath sounds.  No wheezing, rhonchi or rales.  Abdominal:     General: Abdomen is flat. Bowel sounds are normal.  Musculoskeletal:     Cervical back: Normal range of motion and neck supple.  Skin:    General: Skin is warm and dry.     Capillary Refill: Capillary refill takes less than 2 seconds.  Neurological:     General: No focal deficit present.     Mental Status: He is alert and oriented to person, place, and time.  Psychiatric:        Mood and Affect: Mood normal.        Behavior: Behavior normal.        Thought Content: Thought content normal.        Judgment: Judgment normal.      Results for orders placed or performed in visit on 07/18/21  Comp Met (CMET)  Result Value Ref Range   Glucose 126 (H) 70 - 99 mg/dL   BUN 14 8 - 27 mg/dL   Creatinine, Ser 0.98 0.76 - 1.27 mg/dL   eGFR 78 >59 mL/min/1.73   BUN/Creatinine Ratio 14 10 - 24   Sodium 142 134 - 144 mmol/L   Potassium 4.4 3.5 - 5.2 mmol/L   Chloride 104 96 - 106 mmol/L   CO2 25 20 - 29 mmol/L   Calcium 8.7 8.6 - 10.2 mg/dL   Total Protein 6.5 6.0 - 8.5 g/dL   Albumin 4.0 3.7 - 4.7 g/dL   Globulin, Total 2.5 1.5 - 4.5 g/dL   Albumin/Globulin Ratio 1.6 1.2 - 2.2   Bilirubin Total 0.4 0.0 - 1.2 mg/dL   Alkaline Phosphatase 74 44 - 121 IU/L   AST 25 0 - 40 IU/L   ALT 18 0 - 44 IU/L  Lipid Profile  Result Value Ref Range   Cholesterol, Total 126 100 - 199 mg/dL   Triglycerides 78 0 - 149 mg/dL   HDL 36 (L) >39 mg/dL   VLDL Cholesterol Cal 16 5 - 40 mg/dL   LDL Chol Calc (NIH) 74 0 - 99 mg/dL   Chol/HDL Ratio 3.5 0.0 - 5.0 ratio  HgB A1c  Result Value Ref Range   Hgb A1c MFr Bld 6.6 (H) 4.8 - 5.6 %   Est. 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  Controlled.  Continue with current medication regimen of Metformin 1067m BID.  Last A1c 6.6%.  Labs ordered today.  Return to clinic in 3 months for reevaluation.  Call sooner if concerns arise.        Relevant Medications   atorvastatin (LIPITOR) 40 MG tablet   Other Relevant Orders   Comp Met (CMET)   Hypertension associated with diabetes ([place]    Chronic.  Controlled without medication.  Would benefit from ACE or ARB for kidney protection will discss at next visit.  Labs ordered today.   Return to clinic in 3 months for reevaluation.  Call sooner if concerns arise.        Relevant Medications   atorvastatin (LIPITOR) 40 MG  tablet   Other Relevant Orders   HgB A1c   [name] ([place])    Reassured patient.  Recommend caution when working outside to prevent bruising and skin tearing.      Relevant Medications   atorvastatin (LIPITOR) 40 MG tablet     Other   Pure hypercholesterolemia    Chronic.  Controlled with Atorvastatin 31m daily.  Refills sent today.  Labs ordered today.  Return to clinic in 3 months for reevaluation.  Call sooner if concerns arise.        Relevant Medications   atorvastatin (LIPITOR) 40 MG tablet   Other Relevant Orders   Lipid Profile     Follow up plan: Return in about 3 months (around 01/18/2022) for HTN, HLD, DM2 FU.

## 2021-10-18 ENCOUNTER — Ambulatory Visit: Payer: Medicare PPO | Admitting: Nurse Practitioner

## 2021-10-18 ENCOUNTER — Encounter: Payer: Self-pay | Admitting: Nurse Practitioner

## 2021-10-18 VITALS — BP 129/80 | HR 53 | Temp 97.6°F | Wt 206.2 lb

## 2021-10-18 DIAGNOSIS — I129 Hypertensive chronic kidney disease with stage 1 through stage 4 chronic kidney disease, or unspecified chronic kidney disease: Secondary | ICD-10-CM

## 2021-10-18 DIAGNOSIS — I152 Hypertension secondary to endocrine disorders: Secondary | ICD-10-CM

## 2021-10-18 DIAGNOSIS — E7800 Pure hypercholesterolemia, unspecified: Secondary | ICD-10-CM | POA: Diagnosis not present

## 2021-10-18 DIAGNOSIS — E1122 Type 2 diabetes mellitus with diabetic chronic kidney disease: Secondary | ICD-10-CM

## 2021-10-18 DIAGNOSIS — D692 Other nonthrombocytopenic purpura: Secondary | ICD-10-CM | POA: Diagnosis not present

## 2021-10-18 DIAGNOSIS — N181 Chronic kidney disease, stage 1: Secondary | ICD-10-CM | POA: Diagnosis not present

## 2021-10-18 DIAGNOSIS — E1159 Type 2 diabetes mellitus with other circulatory complications: Secondary | ICD-10-CM | POA: Diagnosis not present

## 2021-10-18 MED ORDER — ATORVASTATIN CALCIUM 40 MG PO TABS: 40.0000 mg | ORAL_TABLET | Freq: Every day | ORAL | 1 refills | Status: DC

## 2021-10-18 NOTE — Assessment & Plan Note (Signed)
Reassured patient.  Recommend caution when working outside to prevent bruising and skin tearing.

## 2021-10-18 NOTE — Assessment & Plan Note (Signed)
Chronic.  Controlled.  Continue with current medication regimen of Metformin '1000mg'$  BID.  Last A1c 6.6%.  Labs ordered today.  Return to clinic in 3 months for reevaluation.  Call sooner if concerns arise.

## 2021-10-18 NOTE — Assessment & Plan Note (Signed)
Chronic.  Controlled without medication.  Would benefit from ACE or ARB for kidney protection will discss at next visit.  Labs ordered today.  Return to clinic in 3 months for reevaluation.  Call sooner if concerns arise.

## 2021-10-18 NOTE — Assessment & Plan Note (Signed)
Chronic.  Controlled with Atorvastatin 40mg daily.  Refills sent today.  Labs ordered today.  Return to clinic in 3 months for reevaluation.  Call sooner if concerns arise.   

## 2021-10-19 ENCOUNTER — Telehealth: Payer: Self-pay | Admitting: Nurse Practitioner

## 2021-10-19 LAB — COMPREHENSIVE METABOLIC PANEL
ALT: 13 IU/L (ref 0–44)
AST: 20 IU/L (ref 0–40)
Albumin/Globulin Ratio: 1.8 (ref 1.2–2.2)
Albumin: 4.4 g/dL (ref 3.7–4.7)
Alkaline Phosphatase: 76 IU/L (ref 44–121)
BUN/Creatinine Ratio: 14 (ref 10–24)
BUN: 15 mg/dL (ref 8–27)
Bilirubin Total: 0.6 mg/dL (ref 0.0–1.2)
CO2: 27 mmol/L (ref 20–29)
Calcium: 9.2 mg/dL (ref 8.6–10.2)
Chloride: 102 mmol/L (ref 96–106)
Creatinine, Ser: 1.06 mg/dL (ref 0.76–1.27)
Globulin, Total: 2.5 g/dL (ref 1.5–4.5)
Glucose: 126 mg/dL — ABNORMAL HIGH (ref 70–99)
Potassium: 4.8 mmol/L (ref 3.5–5.2)
Sodium: 142 mmol/L (ref 134–144)
Total Protein: 6.9 g/dL (ref 6.0–8.5)
eGFR: 71 mL/min/{1.73_m2} (ref >59)

## 2021-10-19 LAB — LIPID PANEL
Chol/HDL Ratio: 3.2 ratio (ref 0.0–5.0)
Cholesterol, Total: 130 mg/dL (ref 100–199)
HDL: 41 mg/dL (ref >39)
LDL Chol Calc (NIH): 72 mg/dL (ref 0–99)
Triglycerides: 89 mg/dL (ref 0–149)
VLDL Cholesterol Cal: 17 mg/dL (ref 5–40)

## 2021-10-19 LAB — HEMOGLOBIN A1C
Est. average glucose Bld gHb Est-mCnc: 140 mg/dL
Hgb A1c MFr Bld: 6.5 % — ABNORMAL HIGH (ref 4.8–5.6)

## 2021-10-19 NOTE — Telephone Encounter (Signed)
Pt returned call for lab results. Pt requests call back. Cb# [phone]

## 2021-10-19 NOTE — Telephone Encounter (Signed)
Called pt to give lab results, he verb understanding.

## 2021-10-19 NOTE — Progress Notes (Signed)
Please let patient know that his lab work looks good.  A1c remains well controlled at 6.5. Keep up the good work.  Cholesterol is also at goal.  Continue with current medication regimen.  Follow up as discussed.

## 2021-11-05 ENCOUNTER — Ambulatory Visit
Admission: EM | Admit: 2021-11-05 | Discharge: 2021-11-05 | Disposition: A | Discharge status: Home / Self Care | Payer: Medicare PPO | Attending: Emergency Medicine | Admitting: Emergency Medicine

## 2021-11-05 DIAGNOSIS — B029 Zoster without complications: Secondary | ICD-10-CM | POA: Diagnosis not present

## 2021-11-05 MED ORDER — VALACYCLOVIR HCL 1 G PO TABS: 1000.0000 mg | ORAL_TABLET | Freq: Three times a day (TID) | ORAL | 0 refills | Status: DC

## 2021-11-05 NOTE — ED Provider Notes (Signed)
[facility]    CSN: [id number] Arrival date & time: 11/05/21  1045      History   Chief Complaint Chief Complaint  Patient presents with   Rash    Rash on right lower abdomen. x2weeks    HPI Jack Crane is a 81 y.o. male.   Patient presents with erythematous pruritic rash present to the right abdomen extending to the back for 14 days.  Associated intermittent mild pain to the site.  Has attempted use of calamine lotion which has been ineffective.  Denies changes in soaps, lotions detergents, diet or recent travel.  Denies exposure to wooded or plant areas but endorses that he does mow his lawn frequently.  Has attempted use of calamine lotion which has been somewhat helpful.  Vaccinated for shingles.  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 Authorizing Provider  aspirin 81 MG tablet Take 81 mg by mouth daily.   Yes [provider]  atorvastatin (LIPITOR) 40 MG tablet Take 1 tablet (40 mg total) by mouth daily. 10/18/21  Yes Jon Billings, NP  glucose blood test strip 1 each by  Other route as needed for other. Use as instructed   Yes [provider]  metFORMIN (GLUCOPHAGE) 1000 MG tablet Take 1 tablet (1,000 mg total) by mouth 2 (two) times daily with a meal. 07/18/21  Yes Jon Billings, NP  metroNIDAZOLE (METROGEL) 0.75 % gel APPLY TO AFFECTED AREAS FACE 1-2 TIMES DAILY AS NEEDED 03/06/21   Ralene Bathe, MD    Family History Family History  Problem Relation Age of Onset   Diabetes Mother    Cancer Sister        lung   Diabetes Brother    Diabetes Brother    Diabetes Brother     Social History Social History   Tobacco Use   Smoking status: Former    Types: Cigarettes    Quit date: 11/02/1974    Years since quitting: 47.0   Smokeless tobacco: Current    Types: Chew  Vaping Use   Vaping Use: Never used  Substance Use Topics   Alcohol use: No    Alcohol/week: 0.0 standard drinks of alcohol   Drug use: No     Allergies   Patient has no known allergies.   Review of Systems Review of Systems  Constitutional: Negative.   Respiratory: Negative.    Skin:  Positive for rash. Negative for color change, pallor and wound.  Neurological: Negative.  Physical Exam Triage Vital Signs ED Triage Vitals  Enc Vitals Group     BP 11/05/21 1116 121/79     Pulse Rate 11/05/21 1116 67     Resp 11/05/21 1116 (!) 188     Temp 11/05/21 1116 98.2 F (36.8 C)     Temp Source 11/05/21 1116 Oral     SpO2 11/05/21 1116 100 %     Weight 11/05/21 1114 220 lb (99.8 kg)     Height --      Head Circumference --      Peak Flow --      Pain Score 11/05/21 1114 0     Pain Loc --      Pain Edu? --      Excl. in [place]? --    No data found.  Updated Vital Signs BP 121/79 (BP Location: Left Arm)   Pulse 67   Temp 98.2 F (36.8 C) (Oral)   Resp (!) 188   Wt 220 lb (99.8 kg)   SpO2 100%   BMI 32.49 kg/m   Visual Acuity Right Eye Distance:   Left Eye Distance:   Bilateral Distance:    Right Eye Near:   Left Eye Near:    Bilateral  Near:     Physical Exam Constitutional:      Appearance: Normal appearance.  HENT:     Head: Normocephalic.  Eyes:     Extraocular Movements: Extraocular movements intact.  Pulmonary:     Effort: Pulmonary effort is normal.  Musculoskeletal:     Comments: Erythematous blister and papular rash present to the right lower abdomen extending to the right side of back following the waistline, nontender, nondraining  Neurological:     Mental Status: He is alert and oriented to person, place, and time. Mental status is at baseline.  Psychiatric:        Mood and Affect: Mood normal.        Behavior: Behavior normal.      UC Treatments / Results  Labs (all labs ordered are listed, but only abnormal results are displayed) Labs Reviewed - No data to display  EKG   Radiology No results found.  Procedures Procedures (including critical care time)  Medications Ordered in UC Medications - No data to display  Initial Impression / Assessment and Plan / UC Course  I have reviewed the triage vital signs and the nursing notes.  Pertinent labs & imaging results that were available during my care of the patient were reviewed by me and considered in my medical decision making (see chart for details).  Herpes zoster without complication  Rash on presentation is consistent with shingles, discussed with patient, even though symptoms have been present for 2 weeks, will attempt use of antiviral to help resolve, valacyclovir prescribed and discussed administration, may continue use of topical calamine lotion if helpful, may use Tylenol for management of discomfort, may follow-up with his urgent care or with primary doctor for reevaluation as needed Final Clinical Impressions(s) / UC Diagnoses   Final diagnoses:  None   Discharge Instructions   None    ED Prescriptions   None    PDMP not reviewed this encounter.   Hans Eden, NP 11/05/21 1153

## 2021-11-05 NOTE — ED Triage Notes (Signed)
Pt states that he has a rash on his right lower abdomen that radiates to right lower back. X2 weeks

## 2021-11-05 NOTE — Discharge Instructions (Addendum)
Today your rash appears to be consistent with shingles which is a virus meaning that it will resolve with time  Even though you have had this rash for 2 weeks we will attempt use of antiviral medication to help clear, take valacyclovir 3 times daily (every 8 hours) for the next 7 days  You may continue use of the calamine lotion over the affected area to help with itching and discomfort  Use Tylenol 500 mg every 6 hours when pain is present  Also hold ice over the affected area in 10 to 15-minute intervals for general comfort  May follow-up with the urgent care or your primary doctor for reevaluation as needed

## 2021-11-13 ENCOUNTER — Encounter: Payer: Self-pay | Admitting: Nurse Practitioner

## 2021-11-13 ENCOUNTER — Ambulatory Visit: Payer: Medicare PPO | Admitting: Nurse Practitioner

## 2021-11-13 VITALS — BP 117/73 | HR 75 | Temp 97.7°F | Wt 219.6 lb

## 2021-11-13 DIAGNOSIS — B029 Zoster without complications: Secondary | ICD-10-CM

## 2021-11-13 NOTE — Progress Notes (Signed)
BP 117/73   Pulse 75   Temp 97.7 F (36.5 C) (Oral)   Wt 219 lb 9.6 oz (99.6 kg)   SpO2 97%   BMI 32.43 kg/m    Subjective:    [name], male    DOB: Nov 22, 1940, 81 y.o.   MRN: [id number]  HPI: Jack Crane is a 81 y.o. male  Chief Complaint  Patient presents with   Skin Problem    Pt reports UC DX him with shingles. He was rx "pills" but he has finished the course. States he is here today for evaluation    SHINGLES Patient states he went to UC about 3 weeks ago.  He wants to get them checked out to make sure they still are improving.  Denies pain or itching.     Relevant past medical, surgical, family and social history reviewed and updated as indicated. Interim medical history since our last visit reviewed. Allergies and medications reviewed and updated.  Review of Systems  Skin:        shingles    Per HPI unless specifically indicated above     Objective:    BP 117/73   Pulse 75   Temp 97.7 F (36.5 C) (Oral)   Wt 219 lb 9.6 oz (99.6 kg)   SpO2 97%   BMI 32.43 kg/m   Wt Readings from Last 3 Encounters:  11/13/21 219 lb 9.6 oz (99.6 kg)  11/05/21 220 lb (99.8 kg)  10/18/21 206 lb 3.2 oz (93.5 kg)    Physical Exam Vitals and nursing note reviewed.  Constitutional:      General: He is not in acute distress.    Appearance: Normal appearance. He is not ill-appearing, toxic-appearing or diaphoretic.  HENT:     Head: Normocephalic.     Right Ear: External ear normal.     Left Ear: External ear normal.     Nose: Nose normal. No congestion or rhinorrhea.     Mouth/Throat:     Mouth: Mucous membranes are moist.  Eyes:     General:        Right eye: No discharge.        Left eye: No discharge.     Extraocular Movements: Extraocular movements intact.     Conjunctiva/sclera: Conjunctivae normal.     Pupils: Pupils are equal, round, and reactive to light.  Cardiovascular:     Rate and Rhythm: Normal rate and regular rhythm.      Heart sounds: No murmur heard. Pulmonary:     Effort: Pulmonary effort is normal. No respiratory distress.     Breath sounds: Normal breath sounds. No wheezing, rhonchi or rales.  Abdominal:     General: Abdomen is flat. Bowel sounds are normal.  Musculoskeletal:     Cervical back: Normal range of motion and neck supple.  Skin:    General: Skin is warm and dry.     Capillary Refill: Capillary refill takes less than 2 seconds.       Neurological:     General: No focal deficit present.     Mental Status: He is alert and oriented to person, place, and time.  Psychiatric:        Mood and Affect: Mood normal.        Behavior: Behavior normal.        Thought Content: Thought content normal.        Judgment: Judgment normal.     Results for orders placed or  performed in visit on 10/18/21  Comp Met (CMET)  Result Value Ref Range   Glucose 126 (H) 70 - 99 mg/dL   BUN 15 8 - 27 mg/dL   Creatinine, Ser 1.06 0.76 - 1.27 mg/dL   eGFR 71 >59 mL/min/1.73   BUN/Creatinine Ratio 14 10 - 24   Sodium 142 134 - 144 mmol/L   Potassium 4.8 3.5 - 5.2 mmol/L   Chloride 102 96 - 106 mmol/L   CO2 27 20 - 29 mmol/L   Calcium 9.2 8.6 - 10.2 mg/dL   Total Protein 6.9 6.0 - 8.5 g/dL   Albumin 4.4 3.7 - 4.7 g/dL   Globulin, Total 2.5 1.5 - 4.5 g/dL   Albumin/Globulin Ratio 1.8 1.2 - 2.2   Bilirubin Total 0.6 0.0 - 1.2 mg/dL   Alkaline Phosphatase 76 44 - 121 IU/L   AST 20 0 - 40 IU/L   ALT 13 0 - 44 IU/L  HgB A1c  Result Value Ref Range   Hgb A1c MFr Bld 6.5 (H) 4.8 - 5.6 %   Est. average glucose Bld gHb Est-mCnc 140 mg/dL  Lipid Profile  Result Value Ref Range   Cholesterol, Total 130 100 - 199 mg/dL   Triglycerides 89 0 - 149 mg/dL   HDL 41 >39 mg/dL   VLDL Cholesterol Cal 17 5 - 40 mg/dL   LDL Chol Calc (NIH) 72 0 - 99 mg/dL   Chol/HDL Ratio 3.2 0.0 - 5.0 ratio      Assessment & Plan:   Problem List Items Addressed This Visit   None Visit Diagnoses     Herpes zoster without  complication    -  Primary   Treated in urgent care. Completed course of valtrex.  Resolving. Reassured patient that symptoms are improving.        Follow up plan: Return if symptoms worsen or fail to improve.

## 2021-11-23 DIAGNOSIS — Z0100 Encounter for examination of eyes and vision without abnormal findings: Secondary | ICD-10-CM | POA: Diagnosis not present

## 2021-11-23 DIAGNOSIS — H2513 Age-related nuclear cataract, bilateral: Secondary | ICD-10-CM | POA: Diagnosis not present

## 2021-11-23 DIAGNOSIS — E119 Type 2 diabetes mellitus without complications: Secondary | ICD-10-CM | POA: Diagnosis not present

## 2021-11-23 DIAGNOSIS — H40003 Preglaucoma, unspecified, bilateral: Secondary | ICD-10-CM | POA: Diagnosis not present

## 2021-11-23 LAB — HM DIABETES EYE EXAM

## 2021-12-26 ENCOUNTER — Ambulatory Visit: Payer: Medicare PPO

## 2021-12-29 ENCOUNTER — Ambulatory Visit: Payer: Medicare PPO

## 2022-01-12 ENCOUNTER — Other Ambulatory Visit: Payer: Self-pay | Admitting: Nurse Practitioner

## 2022-01-12 NOTE — Telephone Encounter (Signed)
Requested Prescriptions  Pending Prescriptions Disp Refills  . metFORMIN (GLUCOPHAGE) 1000 MG tablet [Pharmacy Med Name: METFORMIN HCL 1,000 MG TABLET] 180 tablet 1    Sig: TAKE 1 TABLET (1,000 MG TOTAL) BY MOUTH TWICE A DAY WITH FOOD     Endocrinology:  Diabetes - Biguanides Failed - 01/12/2022  2:04 AM      Failed - B12 Level in normal range and within 720 days    No results found for: "VITAMINB12"       Passed - Cr in normal range and within 360 days    Creatinine, Ser  Date Value Ref Range Status  10/18/2021 1.06 0.76 - 1.27 mg/dL Final         Passed - HBA1C is between 0 and 7.9 and within 180 days    Hemoglobin A1C  Date Value Ref Range Status  12/01/2015 7.4  Final   HB A1C (BAYER DCA - WAIVED)  Date Value Ref Range Status  04/20/2020 6.9 <7.0 % Final    Comment:                                          Diabetic Adult            <7.0                                       Healthy Adult        4.3 - 5.7                                                           (DCCT/NGSP) American Diabetes Association's Summary of Glycemic Recommendations for Adults with Diabetes: Hemoglobin A1c <7.0%. More stringent glycemic goals (A1c <6.0%) may further reduce complications at the cost of increased risk of hypoglycemia.    Hgb A1c MFr Bld  Date Value Ref Range Status  10/18/2021 6.5 (H) 4.8 - 5.6 % Final    Comment:             Prediabetes: 5.7 - 6.4          Diabetes: >6.4          Glycemic control for adults with diabetes: <7.0          Passed - eGFR in normal range and within 360 days    GFR calc Af Amer  Date Value Ref Range Status  04/20/2020 66 >59 mL/min/1.73 Final    Comment:    **In accordance with recommendations from the NKF-ASN Task force,**   [facility] is in the process of updating its eGFR calculation to the   2021 CKD-EPI creatinine equation that estimates kidney function   without a race variable.    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)   [facility] Holdsworth, Karen, NP   8 months ago Annual physical exam   [facility] Holdsworth, Karen, NP   1 year ago Acute pain of both knees   [facility] Johnson, Megan P, DO      Future Appointments            In 6 days Holdsworth, Karen, NP [facility]   In 8 months Kowalski, David C, MD [facility]           Passed - CBC within normal limits and completed in the last 12 months    WBC  Date Value Ref Range Status  04/19/2021 4.9 3.4 - 10.8 x10E3/uL Final  10/31/2018 7.6 4.0 - 10.5 K/uL Final   RBC  Date Value Ref Range Status  04/19/2021 4.32 4.14 - 5.80 x10E6/uL Final  10/31/2018 4.57 4.22 - 5.81 MIL/uL Final   Hemoglobin  Date Value Ref Range Status  04/19/2021 13.4 13.0 - 17.7 g/dL Final   Hematocrit  Date Value Ref Range Status  04/19/2021 40.0 37.5 - 51.0 % Final   MCHC  Date Value Ref Range Status  04/19/2021 33.5 31.5 - 35.7 g/dL Final  10/31/2018 32.9 30.0 - 36.0 g/dL Final   MCH  Date Value Ref Range Status  04/19/2021 31.0 26.6 - 33.0 pg Final  10/31/2018 30.4 26.0 - 34.0 pg Final   MCV  Date Value Ref Range Status  04/19/2021 93 79 - 97 fL Final   No results found for: "PLTCOUNTKUC", "LABPLAT", "POCPLA" RDW  Date Value Ref Range Status  04/19/2021 12.5 11.6 - 15.4 % Final           

## 2022-01-17 NOTE — Progress Notes (Unsigned)
There were no vitals taken for this visit.   Subjective:    [name], male    DOB: [birth year], 81 y.o.   MRN: [id number]  HPI: Jack Crane is a 81 y.o. male  No chief complaint on file.  HYPERTENSION / HYPERLIPIDEMIA Satisfied with current treatment? yes Duration of hypertension: years BP monitoring frequency: not checking BP range:  BP medication side effects: no Past BP meds: none Duration of hyperlipidemia: years Cholesterol medication side effects: no Cholesterol supplements: none Past cholesterol medications: atorvastain (lipitor) Medication compliance: excellent compliance Aspirin: yes Recent stressors: no Recurrent headaches: no Visual changes: no Palpitations: no Dyspnea: no Chest pain: no Lower extremity edema: no Dizzy/lightheaded: no  DIABETES Last A1c 6.5. Hypoglycemic episodes:no Polydipsia/polyuria: no Visual disturbance: no Chest pain: no Paresthesias: no Glucose Monitoring: yes  Accucheck frequency: daily  Fasting glucose: <160  Post prandial:  Evening:  Before meals: Taking Insulin?: no  Long acting insulin:  Short acting insulin: Blood Pressure Monitoring: not checking Retinal Examination: Up to Date- has it scheduled for next month Foot Exam: Up to Date Diabetic Education: Not Completed Pneumovax: Up to Date Influenza: Up to Date Aspirin: yes   Relevant past medical, surgical, family and social history reviewed and updated as indicated. Interim medical history since our last visit reviewed. Allergies and medications reviewed and updated.  Review of Systems  Eyes:  Negative for visual disturbance.  Respiratory:  Negative for chest tightness and shortness of breath.   Cardiovascular:  Negative for chest pain, palpitations and leg swelling.  Endocrine: Negative for polydipsia and polyuria.  Neurological:  Negative for dizziness, light-headedness, numbness and headaches.    Per HPI unless specifically  indicated above     Objective:    There were no vitals taken for this visit.  Wt Readings from Last 3 Encounters:  11/13/21 219 lb 9.6 oz (99.6 kg)  11/05/21 220 lb (99.8 kg)  10/18/21 206 lb 3.2 oz (93.5 kg)    Physical Exam Vitals and nursing note reviewed.  Constitutional:      General: He is not in acute distress.    Appearance: Normal appearance. He is not ill-appearing, toxic-appearing or diaphoretic.  HENT:     Head: Normocephalic.     Right Ear: External ear normal.     Left Ear: External ear normal.     Nose: Nose normal. No congestion or rhinorrhea.     Mouth/Throat:     Mouth: Mucous membranes are moist.  Eyes:     General:        Right eye: No discharge.        Left eye: No discharge.     Extraocular Movements: Extraocular movements intact.     Conjunctiva/sclera: Conjunctivae normal.     Pupils: Pupils are equal, round, and reactive to light.  Cardiovascular:     Rate and Rhythm: Normal rate and regular rhythm.     Heart sounds: No murmur heard. Pulmonary:     Effort: Pulmonary effort is normal. No respiratory distress.     Breath sounds: Normal breath sounds. No wheezing, rhonchi or rales.  Abdominal:     General: Abdomen is flat. Bowel sounds are normal.  Musculoskeletal:     Cervical back: Normal range of motion and neck supple.  Skin:    General: Skin is warm and dry.     Capillary Refill: Capillary refill takes less than 2 seconds.  Neurological:     General: No focal deficit present.  Mental Status: He is alert and oriented to person, place, and time.  Psychiatric:        Mood and Affect: Mood normal.        Behavior: Behavior normal.        Thought Content: Thought content normal.        Judgment: Judgment normal.     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

## 2022-01-18 ENCOUNTER — Ambulatory Visit: Payer: Medicare PPO | Admitting: Nurse Practitioner

## 2022-01-18 ENCOUNTER — Encounter: Payer: Self-pay | Admitting: Nurse Practitioner

## 2022-01-18 VITALS — BP 127/75 | HR 56 | Temp 97.6°F | Wt 208.0 lb

## 2022-01-18 DIAGNOSIS — I152 Hypertension secondary to endocrine disorders: Secondary | ICD-10-CM

## 2022-01-18 DIAGNOSIS — Z23 Encounter for immunization: Secondary | ICD-10-CM

## 2022-01-18 DIAGNOSIS — E7800 Pure hypercholesterolemia, unspecified: Secondary | ICD-10-CM

## 2022-01-18 DIAGNOSIS — N181 Chronic kidney disease, stage 1: Secondary | ICD-10-CM

## 2022-01-18 DIAGNOSIS — I129 Hypertensive chronic kidney disease with stage 1 through stage 4 chronic kidney disease, or unspecified chronic kidney disease: Secondary | ICD-10-CM

## 2022-01-18 DIAGNOSIS — D692 Other nonthrombocytopenic purpura: Secondary | ICD-10-CM | POA: Diagnosis not present

## 2022-01-18 DIAGNOSIS — E1159 Type 2 diabetes mellitus with other circulatory complications: Secondary | ICD-10-CM

## 2022-01-18 DIAGNOSIS — E1122 Type 2 diabetes mellitus with diabetic chronic kidney disease: Secondary | ICD-10-CM | POA: Diagnosis not present

## 2022-01-18 NOTE — Assessment & Plan Note (Signed)
Reassured patient.  Recommend caution when working outside to prevent bruising and skin tearing.

## 2022-01-18 NOTE — Assessment & Plan Note (Signed)
Chronic.  Controlled without medication.  Would benefit from ACE or ARB for kidney protection.  Labs ordered today.  Return to clinic in 3 months for reevaluation.  Call sooner if concerns arise.

## 2022-01-18 NOTE — Assessment & Plan Note (Signed)
Chronic.  Controlled with Atorvastatin '40mg'$  daily.  Not due for refills.  Labs ordered today.  Return to clinic in 3 months for reevaluation.  Call sooner if concerns arise.

## 2022-01-18 NOTE — Assessment & Plan Note (Signed)
Chronic.  Controlled without medication.  Would benefit from ACE or ARB for kidney protection.  Not interested at this time.  Labs ordered today.  Return to clinic in 3 months for reevaluation.  Call sooner if concerns arise.

## 2022-01-19 LAB — HEMOGLOBIN A1C
Est. average glucose Bld gHb Est-mCnc: 146 mg/dL
Hgb A1c MFr Bld: 6.7 % — ABNORMAL HIGH (ref 4.8–5.6)

## 2022-01-19 LAB — COMPREHENSIVE METABOLIC PANEL
ALT: 15 IU/L (ref 0–44)
AST: 19 IU/L (ref 0–40)
Albumin/Globulin Ratio: 1.5 (ref 1.2–2.2)
Albumin: 4.1 g/dL (ref 3.8–4.8)
Alkaline Phosphatase: 78 IU/L (ref 44–121)
BUN/Creatinine Ratio: 12 (ref 10–24)
BUN: 12 mg/dL (ref 8–27)
Bilirubin Total: 0.6 mg/dL (ref 0.0–1.2)
CO2: 23 mmol/L (ref 20–29)
Calcium: 8.7 mg/dL (ref 8.6–10.2)
Chloride: 104 mmol/L (ref 96–106)
Creatinine, Ser: 1 mg/dL (ref 0.76–1.27)
Globulin, Total: 2.7 g/dL (ref 1.5–4.5)
Glucose: 104 mg/dL — ABNORMAL HIGH (ref 70–99)
Potassium: 4.4 mmol/L (ref 3.5–5.2)
Sodium: 142 mmol/L (ref 134–144)
Total Protein: 6.8 g/dL (ref 6.0–8.5)
eGFR: 76 mL/min/{1.73_m2} (ref >59)

## 2022-01-19 NOTE — Progress Notes (Signed)
Please let patient know that his lab work looks good.  A1c remains well controlled at 6.7.  Continue with current medication regimen.  Follow up as discussed.

## 2022-04-19 ENCOUNTER — Ambulatory Visit: Payer: Medicare PPO | Admitting: Nurse Practitioner

## 2022-05-08 ENCOUNTER — Ambulatory Visit: Payer: Medicare PPO | Admitting: Nurse Practitioner

## 2022-05-08 ENCOUNTER — Encounter: Payer: Self-pay | Admitting: Nurse Practitioner

## 2022-05-08 VITALS — BP 103/63 | HR 56 | Temp 97.7°F | Ht 69.0 in | Wt 208.3 lb

## 2022-05-08 DIAGNOSIS — D692 Other nonthrombocytopenic purpura: Secondary | ICD-10-CM

## 2022-05-08 DIAGNOSIS — R972 Elevated prostate specific antigen [PSA]: Secondary | ICD-10-CM | POA: Diagnosis not present

## 2022-05-08 DIAGNOSIS — E1122 Type 2 diabetes mellitus with diabetic chronic kidney disease: Secondary | ICD-10-CM

## 2022-05-08 DIAGNOSIS — Z0000 Encounter for general adult medical examination without abnormal findings: Secondary | ICD-10-CM

## 2022-05-08 DIAGNOSIS — E7800 Pure hypercholesterolemia, unspecified: Secondary | ICD-10-CM | POA: Diagnosis not present

## 2022-05-08 DIAGNOSIS — E1159 Type 2 diabetes mellitus with other circulatory complications: Secondary | ICD-10-CM | POA: Diagnosis not present

## 2022-05-08 DIAGNOSIS — Z7189 Other specified counseling: Secondary | ICD-10-CM

## 2022-05-08 DIAGNOSIS — I152 Hypertension secondary to endocrine disorders: Secondary | ICD-10-CM

## 2022-05-08 DIAGNOSIS — I129 Hypertensive chronic kidney disease with stage 1 through stage 4 chronic kidney disease, or unspecified chronic kidney disease: Secondary | ICD-10-CM | POA: Diagnosis not present

## 2022-05-08 DIAGNOSIS — N181 Chronic kidney disease, stage 1: Secondary | ICD-10-CM

## 2022-05-08 LAB — URINALYSIS, ROUTINE W REFLEX MICROSCOPIC
Glucose, UA: NEGATIVE
Leukocytes,UA: NEGATIVE
Nitrite, UA: NEGATIVE
Specific Gravity, UA: >1.03 — ABNORMAL HIGH (ref 1.005–1.030)
Urobilinogen, Ur: 0.2 mg/dL (ref 0.2–1.0)
pH, UA: 5 (ref 5.0–7.5)

## 2022-05-08 LAB — MICROSCOPIC EXAMINATION
Bacteria, UA: NONE SEEN
Epithelial Cells (non renal): NONE SEEN /hpf (ref 0–10)
WBC, UA: NONE SEEN /hpf (ref 0–5)

## 2022-05-08 MED ORDER — METFORMIN HCL 1000 MG PO TABS: ORAL_TABLET | ORAL | 1 refills | Status: DC

## 2022-05-08 MED ORDER — ATORVASTATIN CALCIUM 40 MG PO TABS: 40.0000 mg | ORAL_TABLET | Freq: Every day | ORAL | 1 refills | Status: DC

## 2022-05-08 NOTE — Assessment & Plan Note (Signed)
A voluntary discussion about advance care planning including the explanation and discussion of advance directives was extensively discussed  with the patient for 10 minutes with patient. Explanation about the health care proxy and Living will was reviewed and packet with forms with explanation of how to fill them out was given.  During this discussion, the patient was able to identify a health care proxy as Jack Crane.  Patient has an advanced care plan and does not wish to make changes at this time.

## 2022-05-08 NOTE — Assessment & Plan Note (Signed)
Chronic.  Controlled without medication.  Would benefit from ACE or ARB for kidney protection.  Not interested at this time.  Will reevaluate readiness at next visit.  Labs ordered today.  Return to clinic in 3 months for reevaluation.  Call sooner if concerns arise.

## 2022-05-08 NOTE — Assessment & Plan Note (Signed)
Chronic.  Controlled without medication.  Would benefit from ACE or ARB for kidney protection.  Concern for hypotension during visit.  Patient reports recent episodes of dizziness.  Encouraged patient to increase water intake.  Labs ordered today.  Return to clinic in 3 months for reevaluation.  Call sooner if concerns arise.

## 2022-05-08 NOTE — Assessment & Plan Note (Signed)
Chronic.  Controlled with Atorvastatin '40mg'$  daily.  Refills sent today.  Labs ordered today.  Return to clinic in 3 months for reevaluation.  Call sooner if concerns arise.

## 2022-05-08 NOTE — Assessment & Plan Note (Signed)
Chronic.  Controlled.  Continue with current medication regimen.  Labs ordered today.  Return to clinic in 6 months for reevaluation.  Call sooner if concerns arise.  ? ?

## 2022-05-08 NOTE — Progress Notes (Signed)
BP 103/63 (BP Location: Right Arm, Cuff Size: Normal)   Pulse (!) 56   Temp 97.7 F (36.5 C) (Oral)   Ht '5\' 9"'$  (1.753 m)   Wt 208 lb 4.8 oz (94.5 kg)   SpO2 100%   BMI 30.76 kg/m    Subjective:    [name], male    DOB: 1940/12/08, 82 y.o.   MRN: [id number]  HPI: Jack Crane is a 82 y.o. male presenting on 05/08/2022 for comprehensive medical examination. Current medical complaints include:none  He currently lives with: Interim Problems from his last visit: no  HYPERTENSION / HYPERLIPIDEMIA Satisfied with current treatment? yes Duration of hypertension: years BP monitoring frequency: not checking BP range:  BP medication side effects: no Past BP meds: none Duration of hyperlipidemia: years Cholesterol medication side effects: no Cholesterol supplements: none Past cholesterol medications: atorvastain (lipitor) Medication compliance: excellent compliance Aspirin: no Recent stressors: no Recurrent headaches: no Visual changes: no Palpitations: no Dyspnea: no Chest pain: no Lower extremity edema: no Dizzy/lightheaded: yes  DIABETES Hypoglycemic episodes:no Polydipsia/polyuria: no Visual disturbance: no Chest pain: no Paresthesias: no Glucose Monitoring: yes  Accucheck frequency: Daily  Fasting glucose: 130-200  Post prandial:  Evening:  Before meals: Taking Insulin?: no  Long acting insulin:  Short acting insulin: Blood Pressure Monitoring: not checking Retinal Examination: Up to Date Foot Exam: Up to Date Diabetic Education: Not Completed Pneumovax: Up to Date Influenza: Up to Date Aspirin: no   Functional Status Survey: Is the patient deaf or have difficulty hearing?: No Does the patient have difficulty seeing, even when wearing glasses/contacts?: No Does the patient have difficulty concentrating, remembering, or making decisions?: No Does the patient have difficulty walking or climbing stairs?: No Does the patient have  difficulty dressing or bathing?: No Does the patient have difficulty doing errands alone such as visiting a doctor's office or shopping?: No  FALL RISK:    05/08/2022    2:04 PM 01/18/2022    8:57 AM 10/18/2021    8:59 AM 07/18/2021    8:10 AM 04/19/2021    9:08 AM  Fall Risk   Falls in the past year? 1 0 0 0 0  Number falls in past yr: 0 0 0 0 0  Injury with Fall? 0 0 0 0 0  Risk for fall due to : No Fall Risks Other (Comment) No Fall Risks No Fall Risks No Fall Risks  Follow up Falls evaluation completed Falls evaluation completed Falls evaluation completed Falls evaluation completed Falls evaluation completed    Depression Screen    05/08/2022    2:04 PM 01/18/2022    8:57 AM 10/18/2021    8:59 AM 07/18/2021    8:10 AM 04/19/2021    9:08 AM  Depression screen PHQ 2/9  Decreased Interest 0 0 0 0 0  Down, Depressed, Hopeless 0 0 0 0 0  PHQ - 2 Score 0 0 0 0 0  Altered sleeping 0 0 0 1 0  Tired, decreased energy 0 0 0 0 0  Change in appetite 0 0 0 0 0  Feeling bad or failure about yourself  0 0 0 0 0  Trouble concentrating 0 0 0 0 0  Moving slowly or fidgety/restless 0 0 0 0 0  Suicidal thoughts 0 0 0 0 0  PHQ-9 Score 0 0 0 1 0  Difficult doing work/chores Not difficult at all Not difficult at all Not difficult at all Not difficult at all Not  difficult at all    Advanced Directives Patient states he has an advanced directive and his nephew Keldrick Pomplun.  Does not want to make changes to his advanced care plan.   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   glucose blood test strip 1 each by Other route as needed for other. Use as instructed   No current facility-administered medications on file prior to visit.    Allergies:  No Known Allergies  Social History:  Social History   Socioeconomic History   Marital status: Single    Spouse name: Not on file   Number of children: Not on file   Years of education: Not on file   Highest education level: 9th grade  Occupational History   Not on file  Tobacco Use   Smoking status: Former    Types: Cigarettes    Quit date: 11/02/1974    Years since quitting: 47.5   Smokeless tobacco: Current    Types: Chew  Vaping Use   Vaping Use: Never used  Substance and Sexual Activity   Alcohol use: No    Alcohol/week: 0.0 standard drinks of alcohol   Drug use: No   Sexual activity: Yes    Birth control/protection: None  Other Topics Concern   Not on file  Social History Narrative   Not on file   Social Determinants of Health   Financial Resource Strain: Low Risk  (12/23/2020)   Overall Financial Resource Strain (CARDIA)    Difficulty of Paying Living Expenses: Not hard at all  Food Insecurity: No Food Insecurity (12/23/2020)   Hunger Vital Sign    Worried About Running Out of Food in the Last Year: Never true    [place] in the Last Year: Never true  Transportation Needs: No Transportation Needs (12/23/2020)   PRAPARE - Hydrologist (Medical): No    Lack of Transportation (Non-Medical): No  Physical Activity: Inactive (12/23/2020)   Exercise Vital Sign    Days of Exercise per Week: 0 days    Minutes of Exercise per Session: 0 min  Stress: No Stress Concern Present (12/23/2020)   [place]    Feeling of Stress : Not at all  Social Connections: Moderately Isolated (12/06/2017)    Social Connection and Isolation Panel [NHANES]    Frequency of Communication with Friends and Family: Once a week    Frequency of Social Gatherings with Friends and Family: More than three times a week    Attends Religious Services: Never    Marine scientist or Organizations: No    Attends Archivist Meetings: Never    Marital Status: Never married  Intimate Partner Violence: Not At Risk (12/06/2017)   Humiliation, Afraid, Rape, and Kick questionnaire    Fear of Current or  Ex-Partner: No    Emotionally Abused: No    Physically Abused: No    Sexually Abused: No   Social History   Tobacco Use  Smoking Status Former   Types: Cigarettes   Quit date: 11/02/1974   Years since quitting: 47.5  Smokeless Tobacco Current   Types: Chew   Social History   Substance and Sexual Activity  Alcohol Use No   Alcohol/week: 0.0 standard drinks of alcohol    Family History:  Family History  Problem Relation Age of Onset   Diabetes Mother    Cancer Sister        lung   Diabetes Brother    Diabetes Brother    Diabetes Brother     Past medical history, surgical history, medications, allergies, family history and social history reviewed with patient today and changes made to appropriate areas of the chart.   Review of Systems  HENT:         Denies vision changes.  Eyes:  Negative for blurred vision and double vision.  Respiratory:  Negative for shortness of breath.   Cardiovascular:  Negative for chest pain, palpitations and leg swelling.  Neurological:  Positive for dizziness. Negative for tingling and headaches.  Endo/Heme/Allergies:  Negative for polydipsia.       Denies Polyuria   All other ROS negative except what is listed above and in the HPI.      Objective:    BP 103/63 (BP Location: Right Arm, Cuff Size: Normal)   Pulse (!) 56   Temp 97.7 F (36.5 C) (Oral)   Ht '5\' 9"'$  (1.753 m)   Wt 208 lb 4.8 oz (94.5 kg)   SpO2 100%   BMI 30.76 kg/m   Wt Readings  from Last 3 Encounters:  05/08/22 208 lb 4.8 oz (94.5 kg)  01/18/22 208 lb (94.3 kg)  11/13/21 219 lb 9.6 oz (99.6 kg)    No results found.  Physical Exam Vitals and nursing note reviewed.  Constitutional:      General: He is not in acute distress.    Appearance: Normal appearance. He is not ill-appearing, toxic-appearing or diaphoretic.  HENT:     Head: Normocephalic.     Right Ear: Tympanic membrane, ear canal and external ear normal.     Left Ear: Tympanic membrane, ear canal and external ear normal.     Nose: Nose normal. No congestion or rhinorrhea.     Mouth/Throat:     Mouth: Mucous membranes are moist.  Eyes:     General:        Right eye: No discharge.        Left eye: No discharge.     Extraocular Movements: Extraocular movements intact.     Conjunctiva/sclera: Conjunctivae normal.     Pupils: Pupils are equal, round, and reactive to light.  Cardiovascular:     Rate and Rhythm: Normal rate and regular rhythm.     Heart sounds: No murmur heard. Pulmonary:     Effort: Pulmonary effort is normal. No respiratory distress.     Breath sounds: Normal breath sounds. No wheezing, rhonchi or rales.  Abdominal:     General: Abdomen is flat. Bowel sounds are normal. There is no distension.     Palpations: Abdomen is soft.     Tenderness: There is no abdominal tenderness. There is no guarding.  Musculoskeletal:     Cervical back: Normal range of motion and neck supple.  Skin:    General: Skin is warm  and dry.     Capillary Refill: Capillary refill takes less than 2 seconds.  Neurological:     General: No focal deficit present.     Mental Status: He is alert and oriented to person, place, and time.     Cranial Nerves: No cranial nerve deficit.     Motor: No weakness.     Deep Tendon Reflexes: Reflexes normal.  Psychiatric:        Mood and Affect: Mood normal.        Behavior: Behavior normal.        Thought Content: Thought content normal.        Judgment: Judgment  normal.        12/23/2020    1:52 PM 12/21/2019    2:49 PM 12/11/2018    9:31 AM 12/06/2017    1:47 PM 11/30/2016    9:47 AM  6CIT Screen  What Year? 0 points 0 points 0 points 0 points 0 points  What month? 0 points 0 points 0 points 0 points 0 points  What time? 0 points 0 points 0 points 0 points 0 points  Count back from 20 0 points 0 points 0 points 0 points 0 points  Months in reverse 4 points 4 points 0 points 0 points 0 points  Repeat phrase 10 points 10 points 2 points 0 points 2 points  Total Score 14 points 14 points 2 points 0 points 2 points    Cognitive Testing - 6-CIT  Correct? Score   What year is it? yes 0 Yes = 0    No = 4  What month is it? yes 0 Yes = 0    No = 3  Remember:     Pia Mau, [place], [place]     What time is it? yes 0 Yes = 0    No = 3  Count backwards from 20 to 1 yes 0 Correct = 0    1 error = 2   More than 1 error = 4  Say the months of the year in reverse. yes 0 Correct = 0    1 error = 2   More than 1 error = 4  What address did I ask you to remember? yes 2 Correct = 0  1 error = 2    2 error = 4    3 error = 6    4 error = 8    All wrong = 10       TOTAL SCORE  2/28   Interpretation:  Normal  Normal (0-7) Abnormal (8-28)    Results for orders placed or performed in visit on 01/18/22  Comp Met (CMET)  Result Value Ref Range   Glucose 104 (H) 70 - 99 mg/dL   BUN 12 8 - 27 mg/dL   Creatinine, Ser 1.00 0.76 - 1.27 mg/dL   eGFR 76 >59 mL/min/1.73   BUN/Creatinine Ratio 12 10 - 24   Sodium 142 134 - 144 mmol/L   Potassium 4.4 3.5 - 5.2 mmol/L   Chloride 104 96 - 106 mmol/L   CO2 23 20 - 29 mmol/L   Calcium 8.7 8.6 - 10.2 mg/dL   Total Protein 6.8 6.0 - 8.5 g/dL   Albumin 4.1 3.8 - 4.8 g/dL   Globulin, Total 2.7 1.5 - 4.5 g/dL   Albumin/Globulin Ratio 1.5 1.2 - 2.2   Bilirubin Total 0.6 0.0 - 1.2 mg/dL   Alkaline Phosphatase 78 44 - 121  IU/L   AST 19 0 - 40 IU/L   ALT 15 0 - 44 IU/L  HgB A1c  Result Value Ref Range   Hgb  A1c MFr Bld 6.7 (H) 4.8 - 5.6 %   Est. 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  Controlled without medication.  Would benefit from ACE or ARB for kidney protection.  Not interested at this time.  Will reevaluate readiness at next visit.  Labs ordered today.  Return to clinic in 3 months for reevaluation.  Call sooner if concerns arise.        Relevant Medications   atorvastatin (LIPITOR) 40 MG tablet   metFORMIN (GLUCOPHAGE) 1000 MG tablet   Other Relevant Orders   HgB A1c   Hypertension associated with diabetes (HCC)    Chronic.  Controlled without medication.  Would benefit from ACE or ARB for kidney protection.  Concern for hypotension during visit.  Patient reports recent episodes of dizziness.  Encouraged patient to increase water intake.  Labs ordered today.  Return to clinic in 3 months for reevaluation.  Call sooner if concerns arise.        Relevant Medications   atorvastatin (LIPITOR) 40 MG tablet   metFORMIN (GLUCOPHAGE) 1000 MG tablet   Other Relevant Orders   Comprehensive metabolic panel   [name] (HCC)    Chronic.  Controlled.  Continue with current medication regimen.  Labs ordered today.  Return to clinic in 6 months for reevaluation.  Call sooner if concerns arise.        Relevant Medications   atorvastatin (LIPITOR) 40 MG tablet     Other   Pure hypercholesterolemia    Chronic.  Controlled with Atorvastatin '40mg'$  daily.  Refills sent today.  Labs ordered today.  Return to clinic in 3 months for reevaluation.  Call sooner if concerns arise.        Relevant Medications   atorvastatin (LIPITOR) 40 MG tablet   Other Relevant Orders   Lipid panel   Advanced care planning/counseling discussion    A voluntary discussion about advance care planning including the explanation and  discussion of advance directives was extensively discussed  with the patient for 10 minutes with patient. Explanation about the health care proxy and Living will was reviewed and packet with forms with explanation of how to fill them out was given.  During this discussion, the patient was able to identify a health care proxy as Jacquel Mccamish.  Patient has an advanced care plan and does not wish to make changes at this time.        Other Visit Diagnoses     Annual physical exam    -  Primary   Relevant Orders   TSH   PSA   Lipid panel   CBC with Differential/Platelet   Comprehensive metabolic panel   Urinalysis, Routine w reflex microscopic   HgB A1c   Encounter for annual wellness exam in Medicare patient            Preventative Services:  Health Risk Assessment and Personalized Prevention Plan: Up to date Bone Mass Measurements: NA CVD Screening: Up to date Colon Cancer Screening: NA Depression Screening: Up to date Diabetes Screening: Up to date Glaucoma Screening: Up to date Hepatitis  B vaccine: NA Hepatitis C screening: Up to date HIV Screening:Up to date Flu Vaccine: Up to date Lung cancer Screening: Refused Obesity Screening: Up to date Pneumonia Vaccines (2): Up to date STI Screening: NA PSA screening: Up to date  Discussed aspirin prophylaxis for myocardial infarction prevention and decision was made to continue ASA  LABORATORY TESTING:  Health maintenance labs ordered today as discussed above.   The natural history of prostate cancer and ongoing controversy regarding screening and potential treatment outcomes of prostate cancer has been discussed with the patient. The meaning of a false positive PSA and a false negative PSA has been discussed. He indicates understanding of the limitations of this screening test and wishes to proceed with screening PSA testing.   IMMUNIZATIONS:   - Tdap: Tetanus vaccination status reviewed: last tetanus booster within 10  years. - Influenza: Up to date - Pneumovax: Up to date - Prevnar: Up to date - Zostavax vaccine:  Discussed at visit today  SCREENING: - Colonoscopy: Not applicable  Discussed with patient purpose of the colonoscopy is to detect colon cancer at curable precancerous or early stages   - AAA Screening: Not applicable  -Hearing Test: Not applicable  -Spirometry: Not applicable   PATIENT COUNSELING:    Sexuality: Discussed sexually transmitted diseases, partner selection, use of condoms, avoidance of unintended pregnancy  and contraceptive alternatives.   Advised to avoid cigarette smoking.  I discussed with the patient that most people either abstain from alcohol or drink within safe limits (<=14/week and <=4 drinks/occasion for males, <=7/weeks and <= 3 drinks/occasion for females) and that the risk for alcohol disorders and other health effects rises proportionally with the number of drinks per week and how often a drinker exceeds daily limits.  Discussed cessation/primary prevention of drug use and availability of treatment for abuse.   Diet: Encouraged to adjust caloric intake to maintain  or achieve ideal body weight, to reduce intake of dietary saturated fat and total fat, to limit sodium intake by avoiding high sodium foods and not adding table salt, and to maintain adequate dietary potassium and calcium preferably from fresh fruits, vegetables, and low-fat dairy products.    stressed the importance of regular exercise  Injury prevention: Discussed safety belts, safety helmets, smoke detector, smoking near bedding or upholstery.   Dental health: Discussed importance of regular tooth brushing, flossing, and dental visits.   Follow up plan: NEXT PREVENTATIVE PHYSICAL DUE IN 1 YEAR. Return in about 3 months (around 08/07/2022) for HTN, HLD, DM2 FU.

## 2022-05-09 LAB — CBC WITH DIFFERENTIAL/PLATELET
Basophils Absolute: 0 10*3/uL (ref 0.0–0.2)
Basos: 1 %
EOS (ABSOLUTE): 0.1 10*3/uL (ref 0.0–0.4)
Eos: 1 %
Hematocrit: 40.8 % (ref 37.5–51.0)
Hemoglobin: 13.5 g/dL (ref 13.0–17.7)
Immature Grans (Abs): 0 10*3/uL (ref 0.0–0.1)
Immature Granulocytes: 0 %
Lymphocytes Absolute: 1.8 10*3/uL (ref 0.7–3.1)
Lymphs: 31 %
MCH: 30.9 pg (ref 26.6–33.0)
MCHC: 33.1 g/dL (ref 31.5–35.7)
MCV: 93 fL (ref 79–97)
Monocytes Absolute: 0.6 10*3/uL (ref 0.1–0.9)
Monocytes: 11 %
Neutrophils Absolute: 3.3 10*3/uL (ref 1.4–7.0)
Neutrophils: 56 %
Platelets: 218 10*3/uL (ref 150–450)
RBC: 4.37 x10E6/uL (ref 4.14–5.80)
RDW: 12.2 % (ref 11.6–15.4)
WBC: 5.8 10*3/uL (ref 3.4–10.8)

## 2022-05-09 LAB — LIPID PANEL
Chol/HDL Ratio: 2.9 ratio (ref 0.0–5.0)
Cholesterol, Total: 123 mg/dL (ref 100–199)
HDL: 43 mg/dL (ref >39)
LDL Chol Calc (NIH): 64 mg/dL (ref 0–99)
Triglycerides: 80 mg/dL (ref 0–149)
VLDL Cholesterol Cal: 16 mg/dL (ref 5–40)

## 2022-05-09 LAB — HEMOGLOBIN A1C
Est. average glucose Bld gHb Est-mCnc: 146 mg/dL
Hgb A1c MFr Bld: 6.7 % — ABNORMAL HIGH (ref 4.8–5.6)

## 2022-05-09 LAB — COMPREHENSIVE METABOLIC PANEL
ALT: 14 IU/L (ref 0–44)
AST: 27 IU/L (ref 0–40)
Albumin/Globulin Ratio: 2 (ref 1.2–2.2)
Albumin: 4.3 g/dL (ref 3.7–4.7)
Alkaline Phosphatase: 83 IU/L (ref 44–121)
BUN/Creatinine Ratio: 14 (ref 10–24)
BUN: 15 mg/dL (ref 8–27)
Bilirubin Total: 0.7 mg/dL (ref 0.0–1.2)
CO2: 23 mmol/L (ref 20–29)
Calcium: 8.5 mg/dL — ABNORMAL LOW (ref 8.6–10.2)
Chloride: 101 mmol/L (ref 96–106)
Creatinine, Ser: 1.11 mg/dL (ref 0.76–1.27)
Globulin, Total: 2.2 g/dL (ref 1.5–4.5)
Glucose: 124 mg/dL — ABNORMAL HIGH (ref 70–99)
Potassium: 4.4 mmol/L (ref 3.5–5.2)
Sodium: 142 mmol/L (ref 134–144)
Total Protein: 6.5 g/dL (ref 6.0–8.5)
eGFR: 67 mL/min/{1.73_m2} (ref >59)

## 2022-05-09 LAB — PSA: Prostate Specific Ag, Serum: 6.7 ng/mL — ABNORMAL HIGH (ref 0.0–4.0)

## 2022-05-09 LAB — TSH: TSH: 2.54 u[IU]/mL (ref 0.450–4.500)

## 2022-05-09 NOTE — Progress Notes (Signed)
Please let patient know that his lab work shows that his Prostate cancer screening is elevated from prior.  I would like him to come back in 1 month for a lab visit to recheck this.   His A1c is is stable at 6.7 which is great.  His urine does look like he was dehydrated.  I recommend he increase his water intake like we discussed during the visit yesterday.

## 2022-05-09 NOTE — Addendum Note (Signed)
Addended by: Jon Billings on: 05/09/2022 08:40 AM   Modules accepted: Orders

## 2022-06-11 ENCOUNTER — Other Ambulatory Visit: Payer: Medicare PPO

## 2022-06-11 DIAGNOSIS — R972 Elevated prostate specific antigen [PSA]: Secondary | ICD-10-CM

## 2022-06-12 LAB — PSA: Prostate Specific Ag, Serum: 5.7 ng/mL — ABNORMAL HIGH (ref 0.0–4.0)

## 2022-06-12 NOTE — Progress Notes (Signed)
Please let patient know that his PSA improved some but it is still a good bit higher than his normal.  I have placed a referral to Urology for him to have further evaluation.

## 2022-07-05 ENCOUNTER — Other Ambulatory Visit: Payer: Self-pay | Admitting: Family Medicine

## 2022-07-05 ENCOUNTER — Telehealth: Payer: Self-pay | Admitting: Nurse Practitioner

## 2022-07-05 NOTE — Telephone Encounter (Addendum)
Pt came to the office with two concerns one being that he thought that out office had called him to r/s his appointment with someone other than Santiago Glad but with looking in his chart it was the urologist that had called him.  Pt stated that he will go up there and take care of that.  2.  Pt is wanting a refill on a medication that was discontinued in 2022 by the provider Rx is Diclofenac Sodium (VOLTAREN) 1% Gel (Topical) [facility] [address]

## 2022-07-05 NOTE — Telephone Encounter (Deleted)
Error/ltd ° °

## 2022-07-06 NOTE — Telephone Encounter (Signed)
Requested medication (s) are due for refill today: routing for review  Requested medication (s) are on the active medication list: no  Last refill:  unknown  Future visit scheduled: yes  Notes to clinic:  Unable to refill per protocol, Rx expired. Medication is not on current medication list. Routing for approval.      Requested Prescriptions  Pending Prescriptions Disp Refills   diclofenac Sodium (VOLTAREN) 1 % GEL [Pharmacy Med Name: DICLOFENAC SODIUM 1% GEL] 100 g 12    Sig: APPLY 4 G TOPICALLY 4 TIMES DAILY     Analgesics:  Topicals Failed - 07/05/2022  3:20 PM      Failed - Manual Review: Labs are only required if the patient has taken medication for more than 8 weeks.      Passed - PLT in normal range and within 360 days    Platelets  Date Value Ref Range Status  05/08/2022 218 150 - 450 x10E3/uL Final         Passed - HGB in normal range and within 360 days    Hemoglobin  Date Value Ref Range Status  05/08/2022 13.5 13.0 - 17.7 g/dL Final         Passed - HCT in normal range and within 360 days    Hematocrit  Date Value Ref Range Status  05/08/2022 40.8 37.5 - 51.0 % Final         Passed - Cr in normal range and within 360 days    Creatinine, Ser  Date Value Ref Range Status  05/08/2022 1.11 0.76 - 1.27 mg/dL Final         Passed - eGFR is 30 or above and within 360 days    GFR calc Af Amer  Date Value Ref Range Status  04/20/2020 66 >59 mL/min/1.73 Final    Comment:    **In accordance with recommendations from the NKF-ASN Task force,**   [facility] is in the process of updating its eGFR calculation to the   2021 CKD-EPI creatinine equation that estimates kidney function   without a race variable.    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

## 2022-07-12 ENCOUNTER — Ambulatory Visit: Payer: Medicare PPO | Admitting: Urology

## 2022-08-01 ENCOUNTER — Ambulatory Visit: Payer: Medicare PPO | Admitting: Urology

## 2022-08-01 ENCOUNTER — Encounter: Payer: Self-pay | Admitting: Urology

## 2022-08-01 VITALS — BP 112/68 | HR 96 | Ht 69.0 in | Wt 210.0 lb

## 2022-08-01 DIAGNOSIS — Z125 Encounter for screening for malignant neoplasm of prostate: Secondary | ICD-10-CM

## 2022-08-01 NOTE — Patient Instructions (Signed)

## 2022-08-01 NOTE — Progress Notes (Signed)
   08/01/2022 2:21 PM   Jack Crane 1940/09/18 [id number]  Reason for visit: Follow up PSA screening  HPI: 82 year old relatively frail appearing male who I originally saw in July 2021 for PSA screening.  PSA at that time was 4.4 which was relatively stable from 3.7 and 3 previously over the last 4 to 5 years.  DRE was benign, and using shared decision making he opted to discontinue PSA screening.  His PCP has continued to check PSA over the last few years, and this has been overall stable including 4.4 in June 2021, 5.23 March 2020, 4.1 in February 2022, 4.30 March 2021, 6.29 April 2022, and 5.7 in February 2024.  We had another long conversation today about the AUA guidelines that do not recommend routine screening in men over 70, as well as the risks and benefits of screening.  We also discussed that a normal PSA for men in their 80s is less than 6.5, so he is still within the normal range for his age.  We reviewed options including discontinuing PSA screening, prostate MRI, or prostate biopsy.  I recommended discontinuing screening per the guideline recommendations, especially in the setting of his normal PSA for his age, and overall PSA stability over the last 5 years with very slow uptrend consistent with BPH, as well as his benign DRE that has shown enlarged prostate previously.  I offered DRE again today but he deferred.  We discussed the very low, but not 0, risk of developing or dying from metastatic prostate cancer, and he understands his risk.  He is in agreement to discontinue PSA screening which I think is very reasonable.  Recommend stop checking PSA, follow-up as needed.   Sondra Come, MD  [facility] [address], [address] [place], [place] [address] [phone]

## 2022-08-06 NOTE — Progress Notes (Unsigned)
There were no vitals taken for this visit.   Subjective:    [name], male    DOB: 01/01/1941, 82 y.o.   MRN: [id number]  HPI: Jack Crane is a 82 y.o. male  No chief complaint on file.  HYPERTENSION / HYPERLIPIDEMIA Doing well, no concerns at visit today.  Satisfied with current treatment? yes Duration of hypertension: years BP monitoring frequency: not checking BP range:  BP medication side effects: no Past BP meds: none Duration of hyperlipidemia: years Cholesterol medication side effects: no Cholesterol supplements: none Past cholesterol medications: atorvastain (lipitor) Medication compliance: excellent compliance Aspirin: yes Recent stressors: no Recurrent headaches: no Visual changes: no Palpitations: no Dyspnea: no Chest pain: no Lower extremity edema: no Dizzy/lightheaded: no  DIABETES Last A1c 6.5. Hypoglycemic episodes:no Polydipsia/polyuria: no Visual disturbance: no Chest pain: no Paresthesias: no Glucose Monitoring: yes  Accucheck frequency: daily  Fasting glucose: 130-200  Post prandial:  Evening:  Before meals: Taking Insulin?: no  Long acting insulin:  Short acting insulin: Blood Pressure Monitoring: not checking Retinal Examination: Up to Date- has it scheduled for next month Foot Exam: Up to Date Diabetic Education: Not Completed Pneumovax: Up to Date Influenza: Up to Date Aspirin: yes   Relevant past medical, surgical, family and social history reviewed and updated as indicated. Interim medical history since our last visit reviewed. Allergies and medications reviewed and updated.  Review of Systems  Eyes:  Negative for visual disturbance.  Respiratory:  Negative for chest tightness and shortness of breath.   Cardiovascular:  Negative for chest pain, palpitations and leg swelling.  Endocrine: Negative for polydipsia and polyuria.  Neurological:  Negative for dizziness, light-headedness, numbness and  headaches.    Per HPI unless specifically indicated above     Objective:    There were no vitals taken for this visit.  Wt Readings from Last 3 Encounters:  08/01/22 210 lb (95.3 kg)  05/08/22 208 lb 4.8 oz (94.5 kg)  01/18/22 208 lb (94.3 kg)    Physical Exam Vitals and nursing note reviewed.  Constitutional:      General: He is not in acute distress.    Appearance: Normal appearance. He is normal weight. He is not ill-appearing, toxic-appearing or diaphoretic.  HENT:     Head: Normocephalic.     Right Ear: External ear normal.     Left Ear: External ear normal.     Nose: Nose normal. No congestion or rhinorrhea.     Mouth/Throat:     Mouth: Mucous membranes are moist.  Eyes:     General:        Right eye: No discharge.        Left eye: No discharge.     Extraocular Movements: Extraocular movements intact.     Conjunctiva/sclera: Conjunctivae normal.     Pupils: Pupils are equal, round, and reactive to light.  Cardiovascular:     Rate and Rhythm: Normal rate and regular rhythm.     Heart sounds: No murmur heard. Pulmonary:     Effort: Pulmonary effort is normal. No respiratory distress.     Breath sounds: Normal breath sounds. No wheezing, rhonchi or rales.  Abdominal:     General: Abdomen is flat. Bowel sounds are normal.  Musculoskeletal:     Cervical back: Normal range of motion and neck supple.  Skin:    General: Skin is warm and dry.     Capillary Refill: Capillary refill takes less than 2 seconds.  Neurological:     General: No focal deficit present.     Mental Status: He is alert and oriented to person, place, and time.  Psychiatric:        Mood and Affect: Mood normal.        Behavior: Behavior normal.        Thought Content: Thought content normal.        Judgment: Judgment normal.    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

## 2022-08-07 ENCOUNTER — Ambulatory Visit: Payer: Medicare PPO | Admitting: Nurse Practitioner

## 2022-08-07 ENCOUNTER — Encounter: Payer: Self-pay | Admitting: Nurse Practitioner

## 2022-08-07 VITALS — BP 123/71 | HR 55 | Temp 97.5°F | Wt 208.9 lb

## 2022-08-07 DIAGNOSIS — E6609 Other obesity due to excess calories: Secondary | ICD-10-CM

## 2022-08-07 DIAGNOSIS — I129 Hypertensive chronic kidney disease with stage 1 through stage 4 chronic kidney disease, or unspecified chronic kidney disease: Secondary | ICD-10-CM | POA: Diagnosis not present

## 2022-08-07 DIAGNOSIS — E669 Obesity, unspecified: Secondary | ICD-10-CM

## 2022-08-07 DIAGNOSIS — D692 Other nonthrombocytopenic purpura: Secondary | ICD-10-CM | POA: Diagnosis not present

## 2022-08-07 DIAGNOSIS — Z7984 Long term (current) use of oral hypoglycemic drugs: Secondary | ICD-10-CM | POA: Diagnosis not present

## 2022-08-07 DIAGNOSIS — Z6830 Body mass index (BMI) 30.0-30.9, adult: Secondary | ICD-10-CM | POA: Diagnosis not present

## 2022-08-07 DIAGNOSIS — E1122 Type 2 diabetes mellitus with diabetic chronic kidney disease: Secondary | ICD-10-CM | POA: Diagnosis not present

## 2022-08-07 DIAGNOSIS — N181 Chronic kidney disease, stage 1: Secondary | ICD-10-CM | POA: Diagnosis not present

## 2022-08-07 DIAGNOSIS — E1159 Type 2 diabetes mellitus with other circulatory complications: Secondary | ICD-10-CM

## 2022-08-07 DIAGNOSIS — I152 Hypertension secondary to endocrine disorders: Secondary | ICD-10-CM | POA: Diagnosis not present

## 2022-08-07 DIAGNOSIS — E7800 Pure hypercholesterolemia, unspecified: Secondary | ICD-10-CM

## 2022-08-07 LAB — MICROALBUMIN, URINE WAIVED
Creatinine, Urine Waived: 100 mg/dL (ref 10–300)
Microalb, Ur Waived: 80 mg/L — ABNORMAL HIGH (ref 0–19)

## 2022-08-07 NOTE — Assessment & Plan Note (Signed)
Chronic.  Controlled without medication.  Would benefit from ACE or ARB for kidney protection.  Concern for hypotension during visit.  Patient reports recent episodes of dizziness.  Blood pressure running 1140-140s/70-80s at home.  Encouraged patient to increase water intake.  Labs ordered today.  Return to clinic in 3 months for reevaluation.  Call sooner if concerns arise.

## 2022-08-07 NOTE — Assessment & Plan Note (Signed)
Recommended eating smaller high protein, low fat meals more frequently and exercising 30 mins a day 5 times a week with a goal of 10-15lb weight loss in the next 3 months.  

## 2022-08-07 NOTE — Assessment & Plan Note (Signed)
Chronic.  Reassured patient during visit today.  

## 2022-08-07 NOTE — Assessment & Plan Note (Signed)
Chronic.  Controlled without medication.  Would benefit from ACE or ARB for kidney protection.  Not interested at this time.  Will reevaluate readiness at next visit.  Last A1c 6.7%.  Labs ordered today.  Return to clinic in 3 months for reevaluation.  Call sooner if concerns arise.

## 2022-08-08 LAB — COMPREHENSIVE METABOLIC PANEL
ALT: 13 IU/L (ref 0–44)
AST: 20 IU/L (ref 0–40)
Albumin/Globulin Ratio: 2 (ref 1.2–2.2)
Albumin: 4.3 g/dL (ref 3.7–4.7)
Alkaline Phosphatase: 80 IU/L (ref 44–121)
BUN/Creatinine Ratio: 15 (ref 10–24)
BUN: 16 mg/dL (ref 8–27)
Bilirubin Total: 0.6 mg/dL (ref 0.0–1.2)
CO2: 23 mmol/L (ref 20–29)
Calcium: 8.8 mg/dL (ref 8.6–10.2)
Chloride: 103 mmol/L (ref 96–106)
Creatinine, Ser: 1.06 mg/dL (ref 0.76–1.27)
Globulin, Total: 2.2 g/dL (ref 1.5–4.5)
Glucose: 110 mg/dL — ABNORMAL HIGH (ref 70–99)
Potassium: 4.2 mmol/L (ref 3.5–5.2)
Sodium: 142 mmol/L (ref 134–144)
Total Protein: 6.5 g/dL (ref 6.0–8.5)
eGFR: 71 mL/min/{1.73_m2} (ref >59)

## 2022-08-08 LAB — HEMOGLOBIN A1C
Est. average glucose Bld gHb Est-mCnc: 148 mg/dL
Hgb A1c MFr Bld: 6.8 % — ABNORMAL HIGH (ref 4.8–5.6)

## 2022-08-08 NOTE — Progress Notes (Signed)
Please let patient know that his lab work looks good.  A1c is well controlled at 6.8%.  Liver, kidneys and electrolytes look good.  No concerns at this time.  Continue with current medication regimen.  Follow up as discussed.

## 2022-09-19 ENCOUNTER — Ambulatory Visit: Payer: Medicare PPO | Admitting: Dermatology

## 2022-09-19 VITALS — BP 130/77

## 2022-09-19 DIAGNOSIS — C44629 Squamous cell carcinoma of skin of left upper limb, including shoulder: Secondary | ICD-10-CM

## 2022-09-19 DIAGNOSIS — W908XXA Exposure to other nonionizing radiation, initial encounter: Secondary | ICD-10-CM | POA: Diagnosis not present

## 2022-09-19 DIAGNOSIS — L570 Actinic keratosis: Secondary | ICD-10-CM | POA: Diagnosis not present

## 2022-09-19 DIAGNOSIS — Z872 Personal history of diseases of the skin and subcutaneous tissue: Secondary | ICD-10-CM | POA: Diagnosis not present

## 2022-09-19 DIAGNOSIS — X32XXXA Exposure to sunlight, initial encounter: Secondary | ICD-10-CM

## 2022-09-19 DIAGNOSIS — D692 Other nonthrombocytopenic purpura: Secondary | ICD-10-CM | POA: Diagnosis not present

## 2022-09-19 DIAGNOSIS — D485 Neoplasm of uncertain behavior of skin: Secondary | ICD-10-CM

## 2022-09-19 DIAGNOSIS — L578 Other skin changes due to chronic exposure to nonionizing radiation: Secondary | ICD-10-CM

## 2022-09-19 NOTE — Patient Instructions (Signed)
Cryotherapy Aftercare  Wash gently with soap and water everyday.   Apply Vaseline and Band-Aid daily until healed.    Wound Care Instructions  Cleanse wound gently with soap and water once a day then pat dry with clean gauze. Apply a thin coat of Petrolatum (petroleum jelly, "Vaseline") over the wound (unless you have an allergy to this). We recommend that you use a new, sterile tube of Vaseline. Do not pick or remove scabs. Do not remove the yellow or white "healing tissue" from the base of the wound.  Cover the wound with fresh, clean, nonstick gauze and secure with paper tape. You may use Band-Aids in place of gauze and tape if the wound is small enough, but would recommend trimming much of the tape off as there is often too much. Sometimes Band-Aids can irritate the skin.  You should call the office for your biopsy report after 1 week if you have not already been contacted.  If you experience any problems, such as abnormal amounts of bleeding, swelling, significant bruising, significant pain, or evidence of infection, please call the office immediately.  FOR ADULT SURGERY PATIENTS: If you need something for pain relief you may take 1 extra strength Tylenol (acetaminophen) AND 2 Ibuprofen (200mg each) together every 4 hours as needed for pain. (do not take these if you are allergic to them or if you have a reason you should not take them.) Typically, you may only need pain medication for 1 to 3 days.     Due to recent changes in healthcare laws, you may see results of your pathology and/or laboratory studies on MyChart before the doctors have had a chance to review them. We understand that in some cases there may be results that are confusing or concerning to you. Please understand that not all results are received at the same time and often the doctors may need to interpret multiple results in order to provide you with the best plan of care or course of treatment. Therefore, we ask that you  please give us 2 business days to thoroughly review all your results before contacting the office for clarification. Should we see a critical lab result, you will be contacted sooner.   If You Need Anything After Your Visit  If you have any questions or concerns for your doctor, please call our main line at [phone] and press option 4 to reach your doctor's medical assistant. If no one answers, please leave a voicemail as directed and we will return your call as soon as possible. Messages left after 4 pm will be answered the following business day.   You may also send us a message via MyChart. We typically respond to MyChart messages within 1-2 business days.  For prescription refills, please ask your pharmacy to contact our office. Our fax number is [phone].  If you have an urgent issue when the clinic is closed that cannot wait until the next business day, you can page your doctor at the number below.    Please note that while we do our best to be available for urgent issues outside of office hours, we are not available 24/7.   If you have an urgent issue and are unable to reach us, you may choose to seek medical care at your doctor's office, retail clinic, urgent care center, or emergency room.  If you have a medical emergency, please immediately call 911 or go to the emergency department.  Pager Numbers  - Dr. Kowalski: [phone]  -   Dr. Moye: [phone]  - Dr. Stewart: [phone]  In the event of inclement weather, please call our main line at [phone] for an update on the status of any delays or closures.  Dermatology Medication Tips: Please keep the boxes that topical medications come in in order to help keep track of the instructions about where and how to use these. Pharmacies typically print the medication instructions only on the boxes and not directly on the medication tubes.   If your medication is too expensive, please contact our office at  [phone] option 4 or send us a message through MyChart.   We are unable to tell what your co-pay for medications will be in advance as this is different depending on your insurance coverage. However, we may be able to find a substitute medication at lower cost or fill out paperwork to get insurance to cover a needed medication.   If a prior authorization is required to get your medication covered by your insurance company, please allow us 1-2 business days to complete this process.  Drug prices often vary depending on where the prescription is filled and some pharmacies may offer cheaper prices.  The website www.goodrx.com contains coupons for medications through different pharmacies. The prices here do not account for what the cost may be with help from insurance (it may be cheaper with your insurance), but the website can give you the price if you did not use any insurance.  - You can print the associated coupon and take it with your prescription to the pharmacy.  - You may also stop by our office during regular business hours and pick up a GoodRx coupon card.  - If you need your prescription sent electronically to a different pharmacy, notify our office through [facility] MyChart or by phone at [phone] option 4.     Si Usted Necesita Algo Despus de Su Visita  Tambin puede enviarnos un mensaje a travs de MyChart. Por lo general respondemos a los mensajes de MyChart en el transcurso de 1 a 2 das hbiles.  Para renovar recetas, por favor pida a su farmacia que se ponga en contacto con nuestra oficina. Nuestro nmero de fax es el [phone].  Si tiene un asunto urgente cuando la clnica est cerrada y que no puede esperar hasta el siguiente da hbil, puede llamar/localizar a su doctor(a) al nmero que aparece a continuacin.   Por favor, tenga en cuenta que aunque hacemos todo lo posible para estar disponibles para asuntos urgentes fuera del horario de oficina, no estamos  disponibles las 24 horas del da, los 7 das de la semana.   Si tiene un problema urgente y no puede comunicarse con nosotros, puede optar por buscar atencin mdica  en el consultorio de su doctor(a), en una clnica privada, en un centro de atencin urgente o en una sala de emergencias.  Si tiene una emergencia mdica, por favor llame inmediatamente al 911 o vaya a la sala de emergencias.  Nmeros de bper  - Dr. Kowalski: [phone]  - Dra. Moye: [phone]  - Dra. Stewart: [phone]  En caso de inclemencias del tiempo, por favor llame a nuestra lnea principal al [phone] para una actualizacin sobre el estado de cualquier retraso o cierre.  Consejos para la medicacin en dermatologa: Por favor, guarde las cajas en las que vienen los medicamentos de uso tpico para ayudarle a seguir las instrucciones sobre dnde y cmo usarlos. Las farmacias generalmente imprimen las instrucciones del medicamento slo en las cajas y   no directamente en los tubos del medicamento.   Si su medicamento es muy caro, por favor, pngase en contacto con nuestra oficina llamando al [phone] y presione la opcin 4 o envenos un mensaje a travs de MyChart.   No podemos decirle cul ser su copago por los medicamentos por adelantado ya que esto es diferente dependiendo de la cobertura de su seguro. Sin embargo, es posible que podamos encontrar un medicamento sustituto a menor costo o llenar un formulario para que el seguro cubra el medicamento que se considera necesario.   Si se requiere una autorizacin previa para que su compaa de seguros cubra su medicamento, por favor permtanos de 1 a 2 das hbiles para completar este proceso.  Los precios de los medicamentos varan con frecuencia dependiendo del lugar de dnde se surte la receta y alguna farmacias pueden ofrecer precios ms baratos.  El sitio web www.goodrx.com tiene cupones para medicamentos de diferentes farmacias. Los precios aqu no  tienen en cuenta lo que podra costar con la ayuda del seguro (puede ser ms barato con su seguro), pero el sitio web puede darle el precio si no utiliz ningn seguro.  - Puede imprimir el cupn correspondiente y llevarlo con su receta a la farmacia.  - Tambin puede pasar por nuestra oficina durante el horario de atencin regular y recoger una tarjeta de cupones de GoodRx.  - Si necesita que su receta se enve electrnicamente a una farmacia diferente, informe a nuestra oficina a travs de MyChart de  o por telfono llamando al [phone] y presione la opcin 4.  

## 2022-09-19 NOTE — Progress Notes (Signed)
Follow-Up Visit   Subjective  Jack Crane is a 82 y.o. male who presents for the following: History of AK He has a spot on his left elbow that is scaly The patient has spots, moles and lesions to be evaluated, some may be new or changing and the patient may have concern these could be cancer.  The following portions of the chart were reviewed this encounter and updated as appropriate: medications, allergies, medical history  Review of Systems:  No other skin or systemic complaints except as noted in HPI or Assessment and Plan.  Objective  Well appearing patient in no apparent distress; mood and affect are within normal limits. A focused examination was performed of the following areas: scalp, face, arms Relevant exam findings are noted in the Assessment and Plan.  Hands (7) Erythematous thin papules/macules with gritty scale.   Left Elbow - Posterior Hyperkeratotic papule 1.5 cm   Assessment & Plan   ACTINIC DAMAGE - chronic, secondary to cumulative UV radiation exposure/sun exposure over time - diffuse scaly erythematous macules with underlying dyspigmentation - Recommend daily broad spectrum sunscreen SPF 30+ to sun-exposed areas, reapply every 2 hours as needed.  - Recommend staying in the shade or wearing long sleeves, sun glasses (UVA+UVB protection) and wide brim hats (4-inch brim around the entire circumference of the hat). - Call for new or changing lesions.  Purpura - Chronic; persistent and recurrent.  Treatable, but not curable. - Violaceous macules and patches - Benign - Related to trauma, age, sun damage and/or use of blood thinners, chronic use of topical and/or oral steroids - Observe - Can use OTC arnica containing moisturizer such as Dermend Bruise Formula if desired - Call for worsening or other concerns  AK (actinic keratosis) (7) Hands  Destruction of lesion - Hands Complexity: simple   Destruction method: cryotherapy   Informed consent:  discussed and consent obtained   Timeout:  patient name, date of birth, surgical site, and procedure verified Lesion destroyed using liquid nitrogen: Yes   Region frozen until ice ball extended beyond lesion: Yes   Outcome: patient tolerated procedure well with no complications   Post-procedure details: wound care instructions given    Neoplasm of uncertain behavior of skin Left Elbow - Posterior  Epidermal / dermal shaving  Lesion diameter (cm):  1.5 Informed consent: discussed and consent obtained   Timeout: patient name, date of birth, surgical site, and procedure verified   Procedure prep:  Patient was prepped and draped in usual sterile fashion Prep type:  Isopropyl alcohol Anesthesia: the lesion was anesthetized in a standard fashion   Anesthetic:  1% lidocaine w/ epinephrine 1-100,000 buffered w/ 8.4% NaHCO3 Instrument used: flexible razor blade   Hemostasis achieved with: pressure, aluminum chloride and electrodesiccation   Outcome: patient tolerated procedure well   Post-procedure details: sterile dressing applied and wound care instructions given   Dressing type: bandage and petrolatum    Destruction of lesion Complexity: extensive   Destruction method: electrodesiccation and curettage   Informed consent: discussed and consent obtained   Timeout:  patient name, date of birth, surgical site, and procedure verified Procedure prep:  Patient was prepped and draped in usual sterile fashion Prep type:  Isopropyl alcohol Anesthesia: the lesion was anesthetized in a standard fashion   Anesthetic:  1% lidocaine w/ epinephrine 1-100,000 buffered w/ 8.4% NaHCO3 Curettage performed in three different directions: Yes   Electrodesiccation performed over the curetted area: Yes   Lesion length (cm):  1.5 Lesion  width (cm):  1.5 Margin per side (cm):  0.2 Final wound size (cm):  1.9 Hemostasis achieved with:  pressure and aluminum chloride Outcome: patient tolerated procedure well  with no complications   Post-procedure details: sterile dressing applied and wound care instructions given   Dressing type: bandage and petrolatum    Specimen 1 - Surgical pathology Differential Diagnosis: SCC vs other  Check Margins: No EDC today   Return in about 1 year (around 09/19/2023) for AK follow up.  I, Joanie Coddington, CMA, am acting as scribe for Armida Sans, MD .  Documentation: I have reviewed the above documentation for accuracy and completeness, and I agree with the above.  Armida Sans, MD

## 2022-09-24 ENCOUNTER — Encounter: Payer: Self-pay | Admitting: Nurse Practitioner

## 2022-09-26 ENCOUNTER — Encounter: Payer: Self-pay | Admitting: Dermatology

## 2022-09-26 ENCOUNTER — Telehealth: Payer: Self-pay

## 2022-09-26 NOTE — Telephone Encounter (Signed)
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

## 2022-09-26 NOTE — Telephone Encounter (Signed)
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 

## 2022-09-26 NOTE — Telephone Encounter (Signed)
Left pt msg to call for bx results/sh 

## 2022-09-26 NOTE — Telephone Encounter (Signed)
Patient walked in the office to discuss biopsy results, discussed biopsy results with pt

## 2022-10-23 DIAGNOSIS — H6123 Impacted cerumen, bilateral: Secondary | ICD-10-CM | POA: Diagnosis not present

## 2022-10-23 DIAGNOSIS — H60332 Swimmer's ear, left ear: Secondary | ICD-10-CM | POA: Diagnosis not present

## 2022-10-30 ENCOUNTER — Other Ambulatory Visit: Payer: Self-pay | Admitting: Nurse Practitioner

## 2022-10-30 DIAGNOSIS — E7800 Pure hypercholesterolemia, unspecified: Secondary | ICD-10-CM

## 2022-10-30 NOTE — Telephone Encounter (Signed)
Appointment 11/09/22 Requested Prescriptions  Pending Prescriptions Disp Refills   atorvastatin (LIPITOR) 40 MG tablet [Pharmacy Med Name: ATORVASTATIN 40 MG TABLET] 90 tablet 1    Sig: TAKE 1 TABLET BY MOUTH EVERY DAY     Cardiovascular:  Antilipid - Statins Failed - 10/30/2022  2:43 AM      Failed - Lipid Panel in normal range within the last 12 months    Cholesterol, Total  Date Value Ref Range Status  05/08/2022 123 100 - 199 mg/dL Final   Cholesterol Piccolo, Waived  Date Value Ref Range Status  10/30/2018 137 <200 mg/dL Final    Comment:                            Desirable                <200                         Borderline High      200- 239                         High                     >239    LDL Chol Calc (NIH)  Date Value Ref Range Status  05/08/2022 64 0 - 99 mg/dL Final   HDL  Date Value Ref Range Status  05/08/2022 43 >39 mg/dL Final   Triglycerides  Date Value Ref Range Status  05/08/2022 80 0 - 149 mg/dL Final   Triglycerides Piccolo,Waived  Date Value Ref Range Status  10/30/2018 129 <150 mg/dL Final    Comment:                            Normal                   <150                         Borderline High     150 - 199                         High                200 - 499                         Very High                >499          Passed - Patient is not pregnant      Passed - Valid encounter within last 12 months    Recent Outpatient Visits           2 months ago Hypertension associated with diabetes [facility])   [place] [facility] Larae Grooms, NP   5 months ago Annual physical exam   [place] [facility] Larae Grooms, NP   9 months ago 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

## 2022-11-06 ENCOUNTER — Ambulatory Visit: Payer: Medicare PPO | Admitting: Physician Assistant

## 2022-11-09 ENCOUNTER — Encounter: Payer: Self-pay | Admitting: Physician Assistant

## 2022-11-09 ENCOUNTER — Ambulatory Visit: Payer: Medicare PPO | Admitting: Physician Assistant

## 2022-11-09 VITALS — BP 112/67 | HR 67 | Ht 69.0 in | Wt 208.2 lb

## 2022-11-09 DIAGNOSIS — E1159 Type 2 diabetes mellitus with other circulatory complications: Secondary | ICD-10-CM | POA: Diagnosis not present

## 2022-11-09 DIAGNOSIS — E7800 Pure hypercholesterolemia, unspecified: Secondary | ICD-10-CM

## 2022-11-09 DIAGNOSIS — E1122 Type 2 diabetes mellitus with diabetic chronic kidney disease: Secondary | ICD-10-CM

## 2022-11-09 DIAGNOSIS — N181 Chronic kidney disease, stage 1: Secondary | ICD-10-CM

## 2022-11-09 DIAGNOSIS — I129 Hypertensive chronic kidney disease with stage 1 through stage 4 chronic kidney disease, or unspecified chronic kidney disease: Secondary | ICD-10-CM

## 2022-11-09 NOTE — Progress Notes (Signed)
Established Patient Office Visit  Name: Jack Crane   MRN: [id number]    DOB: 1941/03/03   Date:11/09/2022  Today's Provider: Jacquelin Hawking, MHS, PA-C Introduced myself to the patient as a PA-C and provided education on APPs in clinical practice.         Subjective  Chief Complaint  Chief Complaint  Patient presents with   Hyperlipidemia   Hypertension   Diabetes    HPI   HYPERTENSION / HYPERLIPIDEMIA Satisfied with current treatment? yes Duration of hypertension: years BP monitoring frequency:  Has not been checking regularly lately - usually only checks after a dizzy spell, denies recent spells  BP range:  BP medication side effects: no Past BP meds: none Duration of hyperlipidemia: years Cholesterol medication side effects: no Cholesterol supplements: none Past cholesterol medications: atorvastain (lipitor) Medication compliance: excellent compliance Aspirin: yes Recent stressors: no Recurrent headaches: no Visual changes: no Palpitations: no Dyspnea: no Chest pain: no Lower extremity edema: no Dizzy/lightheaded: no  Diabetes, Type 2 - Last A1c 6.8 - Medications: Metformin 1000 mg PO BID  - Compliance: excellent  - Checking BG at home: yes checking every day, once daily about 2 hours after he eats Reports the last few days he has had readings in 200s  - Diet: he tries to avoid excess sugar intake but admits to "getting a little heavy with it" from time to time  - Exercise: staying active around home  - Eye exam: UTD - Foot exam: UTD - Microalbumin: UTD - Statin: on statin - PNA vaccine: Completed  - Denies symptoms of hypoglycemia, polyuria, polydipsia, numbness extremities, foot ulcers/trauma      Patient Active Problem List   Diagnosis Date Noted   Obesity 08/07/2022   Viral URI 04/26/2020   Advanced care planning/counseling discussion 12/12/2016   BPH (benign prostatic hyperplasia) 12/12/2016   Inguinal hernia 12/12/2016    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   Gout 11/01/2014   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) 11/01/2014   Hypertension associated with diabetes (HCC) 11/01/2014   Pure hypercholesterolemia 11/01/2014    Past Surgical History:  Procedure Laterality Date   APPENDECTOMY     stomach abcess      Family History  Problem Relation Age of Onset   Diabetes Mother    Cancer Sister        lung   Diabetes Brother    Diabetes Brother    Diabetes Brother     Social History   Tobacco Use   Smoking status: Former    Current packs/day: 0.00    Types: Cigarettes    Quit date: 11/02/1974    Years since quitting: 48.0   Smokeless tobacco: Current    Types: Chew  Substance Use Topics   Alcohol use: No    Alcohol/week: 0.0 standard drinks of alcohol     Current Outpatient Medications:    aspirin 81 MG tablet, Take 81 mg by mouth daily., Disp: , Rfl:    atorvastatin (LIPITOR) 40 MG tablet, TAKE 1 TABLET BY MOUTH EVERY DAY, Disp: 90 tablet, Rfl: 1   diclofenac Sodium (VOLTAREN) 1 % GEL, APPLY 4 G TOPICALLY 4 TIMES DAILY, Disp: 100 g, Rfl: 12   glucose blood test strip, 1 each by Other route as needed for other. Use as instructed, Disp: , Rfl:    metFORMIN (GLUCOPHAGE) 1000 MG tablet, TAKE 1 TABLET (1,000 MG TOTAL) BY MOUTH TWICE A DAY WITH  FOOD, Disp: 180 tablet, Rfl: 1   neomycin-polymyxin b-dexamethasone (MAXITROL) 3.5-10000-0.1 SUSP, SMARTSIG:In Ear(s), Disp: , Rfl:   No Known Allergies  I personally reviewed active problem list, medication list, health maintenance, notes from last encounter, lab results with the patient/caregiver today.   Review of Systems  Constitutional:  Negative for chills and fever.  Eyes:  Negative for blurred vision, double vision and photophobia.  Respiratory:  Negative for shortness of breath and wheezing.   Cardiovascular:  Negative for chest pain, palpitations and leg swelling.  Gastrointestinal:  Negative for constipation, diarrhea, nausea and  vomiting.  Neurological:  Negative for dizziness and headaches.      Objective  Vitals:   11/09/22 0833  BP: 112/67  Pulse: 67  SpO2: 94%  Weight: 208 lb 3.2 oz (94.4 kg)  Height: 5\' 9"  (1.753 m)    Body mass index is 30.75 kg/m.  Physical Exam Vitals reviewed.  Constitutional:      General: He is awake.     Appearance: Normal appearance. He is well-developed and well-groomed.  HENT:     Head: Normocephalic and atraumatic.  Cardiovascular:     Rate and Rhythm: Normal rate and regular rhythm.     Pulses: Normal pulses.          Radial pulses are 2+ on the right side and 2+ on the left side.     Heart sounds: Normal heart sounds.  Pulmonary:     Effort: Pulmonary effort is normal.     Breath sounds: Normal breath sounds. No decreased air movement. No decreased breath sounds, wheezing, rhonchi or rales.  Musculoskeletal:     Right lower leg: No edema.     Left lower leg: No edema.  Skin:    General: Skin is warm and dry.  Neurological:     General: No focal deficit present.     Mental Status: He is alert and oriented to person, place, and time.  Psychiatric:        Mood and Affect: Mood normal.        Behavior: Behavior normal. Behavior is cooperative.        Thought Content: Thought content normal.        Judgment: Judgment normal.      No results found for this or any previous visit (from the past 2160 hour(s)).   PHQ2/9:    05/08/2022    2:04 PM 01/18/2022    8:57 AM 10/18/2021    8:59 AM 07/18/2021    8:10 AM 04/19/2021    9:08 AM  Depression screen PHQ 2/9  Decreased Interest 0 0 0 0 0  Down, Depressed, Hopeless 0 0 0 0 0  PHQ - 2 Score 0 0 0 0 0  Altered sleeping 0 0 0 1 0  Tired, decreased energy 0 0 0 0 0  Change in appetite 0 0 0 0 0  Feeling bad or failure about yourself  0 0 0 0 0  Trouble concentrating 0 0 0 0 0  Moving slowly or fidgety/restless 0 0 0 0 0  Suicidal thoughts 0 0 0 0 0  PHQ-9 Score 0 0 0 1 0  Difficult doing work/chores  Not difficult at all Not difficult at all Not difficult at all Not difficult at all Not difficult at all      Fall Risk:    05/08/2022    2:04 PM 01/18/2022    8:57 AM 10/18/2021    8:59 AM 07/18/2021  8:10 AM 04/19/2021    9:08 AM  Fall Risk   Falls in the past year? 1 0 0 0 0  Number falls in past yr: 0 0 0 0 0  Injury with Fall? 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  Continue current regimen Follow up in 3 months or sooner if concerns arise         Other   Pure hypercholesterolemia    Chronic, historic condition Appears well managed on current regimen comprised of Atorvastatin 40 mg PO every day  Continue current regimen Recheck cholesterol for monitoring-results to dictate further management Follow up in 6 months or sooner if concerns arise        Relevant Orders   Lipid Profile     Return in about 3 months (around 02/09/2023) for HTN, Diabetes follow up.   I, Cillian Gwinner E Haylyn Halberg, PA-C, have reviewed all  documentation for this visit. The documentation on 11/09/22 for the exam, diagnosis, procedures, and orders are all accurate and complete.   Jacquelin Hawking, MHS, PA-C [facility]

## 2022-11-09 NOTE — Assessment & Plan Note (Signed)
Chronic, historic condition Appears well managed on current regimen comprised of Atorvastatin 40 mg PO every day  Continue current regimen Recheck cholesterol for monitoring-results to dictate further management Follow up in 6 months or sooner if concerns arise

## 2022-11-09 NOTE — Patient Instructions (Addendum)
Please come back in the next 1-2 weeks to complete your labs  Please come back for your lab work after fasting for 8 hours - nothing but water or black coffee at least 8 hours prior to the lab draw  Please let us know if you need a refill of your Metformin before your next apt in 3 months

## 2022-11-09 NOTE — Assessment & Plan Note (Signed)
Chronic, historic condition Appears well managed without medications at this time He reports intermittent episodes of dizziness but denies recent issues or concerns  BP is in goal today in office.  Continue current regimen Follow up in 3 months or sooner if concerns arise

## 2022-11-09 NOTE — Assessment & Plan Note (Signed)
Chronic, historic condition Recheck A1c -results to guide further management Appears to be tolerating Metformin 1000 mg PO BID at this time, denies concerns or side effects today Continue current regimen Follow up in 3 months or sooner if concerns arise

## 2022-11-16 ENCOUNTER — Other Ambulatory Visit: Payer: Medicare PPO

## 2022-11-16 DIAGNOSIS — E1122 Type 2 diabetes mellitus with diabetic chronic kidney disease: Secondary | ICD-10-CM | POA: Diagnosis not present

## 2022-11-16 DIAGNOSIS — N181 Chronic kidney disease, stage 1: Secondary | ICD-10-CM | POA: Diagnosis not present

## 2022-11-16 DIAGNOSIS — H60332 Swimmer's ear, left ear: Secondary | ICD-10-CM | POA: Diagnosis not present

## 2022-11-16 DIAGNOSIS — I129 Hypertensive chronic kidney disease with stage 1 through stage 4 chronic kidney disease, or unspecified chronic kidney disease: Secondary | ICD-10-CM

## 2022-11-16 DIAGNOSIS — E7800 Pure hypercholesterolemia, unspecified: Secondary | ICD-10-CM | POA: Diagnosis not present

## 2022-11-19 NOTE — Progress Notes (Signed)
Your cholesterol looks great- continue your current regimen Your A1c is 6.6 which is in goal- please continue with your medications at this time

## 2022-11-29 ENCOUNTER — Encounter: Payer: Self-pay | Admitting: Dermatology

## 2022-11-29 ENCOUNTER — Ambulatory Visit: Payer: Medicare PPO | Admitting: Dermatology

## 2022-11-29 DIAGNOSIS — D485 Neoplasm of uncertain behavior of skin: Secondary | ICD-10-CM

## 2022-11-29 DIAGNOSIS — C4492 Squamous cell carcinoma of skin, unspecified: Secondary | ICD-10-CM

## 2022-11-29 DIAGNOSIS — C44622 Squamous cell carcinoma of skin of right upper limb, including shoulder: Secondary | ICD-10-CM | POA: Diagnosis not present

## 2022-11-29 DIAGNOSIS — C44629 Squamous cell carcinoma of skin of left upper limb, including shoulder: Secondary | ICD-10-CM

## 2022-11-29 HISTORY — DX: Squamous cell carcinoma of skin, unspecified: C44.92

## 2022-11-29 NOTE — Patient Instructions (Addendum)
Wound Care Instructions  Cleanse wound gently with soap and water once a day then pat dry with clean gauze. Apply a thin coat of Petrolatum (petroleum jelly, "Vaseline") over the wound (unless you have an allergy to this). We recommend that you use a new, sterile tube of Vaseline. Do not pick or remove scabs. Do not remove the yellow or white "healing tissue" from the base of the wound.  Cover the wound with fresh, clean, nonstick gauze and secure with paper tape. You may use Band-Aids in place of gauze and tape if the wound is small enough, but would recommend trimming much of the tape off as there is often too much. Sometimes Band-Aids can irritate the skin.  You should call the office for your biopsy report after 1 week if you have not already been contacted.  If you experience any problems, such as abnormal amounts of bleeding, swelling, significant bruising, significant pain, or evidence of infection, please call the office immediately.  FOR ADULT SURGERY PATIENTS: If you need something for pain relief you may take 1 extra strength Tylenol (acetaminophen) AND 2 Ibuprofen (200mg  each) together every 4 hours as needed for pain. (do not take these if you are allergic to them or if you have a reason you should not take them.) Typically, you may only need pain medication for 1 to 3 days.   Due to recent changes in healthcare laws, you may see results of your pathology and/or laboratory studies on MyChart before the doctors have had a chance to review them. We understand that in some cases there may be results that are confusing or concerning to you. Please understand that not all results are received at the same time and often the doctors may need to interpret multiple results in order to provide you with the best plan of care or course of treatment. Therefore, we ask that you please give [place] 2 business days to thoroughly review all your results before contacting the office for clarification. Should we  see a critical lab result, you will be contacted sooner.   If You Need Anything After Your Visit  If you have any questions or concerns for your doctor, please call our main line at [phone] and press option 4 to reach your doctor's medical assistant. If no one answers, please leave a voicemail as directed and we will return your call as soon as possible. Messages left after 4 pm will be answered the following business day.   You may also send [place] a message via MyChart. We typically respond to MyChart messages within 1-2 business days.  For prescription refills, please ask your pharmacy to contact our office. Our fax number is [phone].  If you have an urgent issue when the clinic is closed that cannot wait until the next business day, you can page your doctor at the number below.    Please note that while we do our best to be available for urgent issues outside of office hours, we are not available 24/7.   If you have an urgent issue and are unable to reach [place], you may choose to seek medical care at your doctor's office, retail clinic, urgent care center, or emergency room.  If you have a medical emergency, please immediately call 911 or go to the emergency department.  Pager Numbers  - Dr. Gwen Pounds: [phone]  - Dr. Roseanne Reno: [phone]  In the event of inclement weather, please call our main line at [phone] for an update on the status  of any delays or closures.  Dermatology Medication Tips: Please keep the boxes that topical medications come in in order to help keep track of the instructions about where and how to use these. Pharmacies typically print the medication instructions only on the boxes and not directly on the medication tubes.   If your medication is too expensive, please contact our office at [phone] option 4 or send [place] a message through MyChart.   We are unable to tell what your co-pay for medications will be in advance as this is different  depending on your insurance coverage. However, we may be able to find a substitute medication at lower cost or fill out paperwork to get insurance to cover a needed medication.   If a prior authorization is required to get your medication covered by your insurance company, please allow [place] 1-2 business days to complete this process.  Drug prices often vary depending on where the prescription is filled and some pharmacies may offer cheaper prices.  The website www.goodrx.com contains coupons for medications through different pharmacies. The prices here do not account for what the cost may be with help from insurance (it may be cheaper with your insurance), but the website can give you the price if you did not use any insurance.  - You can print the associated coupon and take it with your prescription to the pharmacy.  - You may also stop by our office during regular business hours and pick up a GoodRx coupon card.  - If you need your prescription sent electronically to a different pharmacy, notify our office through [facility] or by phone at [phone] option 4.

## 2022-11-29 NOTE — Progress Notes (Signed)
   Follow-Up Visit   Subjective  Jack Crane is a 82 y.o. male who presents for the following: spot at right hand that is getting larger and is sore, present for a few months.  The patient has spots, moles and lesions to be evaluated, some may be new or changing and the patient may have concern these could be cancer.   The following portions of the chart were reviewed this encounter and updated as appropriate: medications, allergies, medical history  Review of Systems:  No other skin or systemic complaints except as noted in HPI or Assessment and Plan.  Objective  Well appearing patient in no apparent distress; mood and affect are within normal limits.   A focused examination was performed of the following areas: R hand L forearm L elbow  Relevant exam findings are noted in the Assessment and Plan.  right dorsal wrist 17 mm hyperkeratotoic pink plaque     Left distal dorsal lateral forearm 5 mm keratotic pink papule 0.8 cm final       Assessment & Plan     Neoplasm of uncertain behavior of skin (2) right dorsal wrist  Skin / nail biopsy Type of biopsy: tangential   Informed consent: discussed and consent obtained   Timeout: patient name, date of birth, surgical site, and procedure verified   Procedure prep:  Patient was prepped and draped in usual sterile fashion Prep type:  Isopropyl alcohol Anesthesia: the lesion was anesthetized in a standard fashion   Anesthetic:  1% lidocaine w/ epinephrine 1-100,000 buffered w/ 8.4% NaHCO3 Instrument used: DermaBlade   Hemostasis achieved with: pressure and aluminum chloride   Outcome: patient tolerated procedure well   Post-procedure details: sterile dressing applied and wound care instructions given   Dressing type: bandage and petrolatum    Left distal dorsal lateral forearm  Skin / nail biopsy Type of biopsy: tangential   Informed consent: discussed and consent obtained   Timeout: patient name, date of  birth, surgical site, and procedure verified   Procedure prep:  Patient was prepped and draped in usual sterile fashion Prep type:  Isopropyl alcohol Anesthesia: the lesion was anesthetized in a standard fashion   Anesthetic:  1% lidocaine w/ epinephrine 1-100,000 buffered w/ 8.4% NaHCO3 Instrument used: DermaBlade   Hemostasis achieved with: pressure, aluminum chloride and electrodesiccation   Outcome: patient tolerated procedure well   Post-procedure details: sterile dressing applied and wound care instructions given   Dressing type: bandage and petrolatum    Destruction of lesion  Destruction method: electrodesiccation and curettage   Informed consent: discussed and consent obtained   Timeout:  patient name, date of birth, surgical site, and procedure verified Anesthesia: the lesion was anesthetized in a standard fashion   Anesthetic:  1% lidocaine w/ epinephrine 1-100,000 buffered w/ 8.4% NaHCO3 Curettage performed in three different directions: Yes   Electrodesiccation performed over the curetted area: Yes   Curettage cycles:  3 Final wound size (cm):  0.8 Hemostasis achieved with:  electrodesiccation Outcome: patient tolerated procedure well with no complications   Post-procedure details: sterile dressing applied and wound care instructions given   Dressing type: petrolatum      Return for as scheduled.  Anise Salvo, RMA, am acting as scribe for Elie Goody, MD .   Documentation: I have reviewed the above documentation for accuracy and completeness, and I agree with the above.  Elie Goody, MD

## 2022-12-06 ENCOUNTER — Telehealth: Payer: Self-pay

## 2022-12-06 NOTE — Telephone Encounter (Signed)
Patient advised pathology results showed SCC. Left distal dorsal lateral forearm was treated with EDC at time of biopsy, Recommend Mohs for right dorsal wrist. Patient lives near [place] so I suggested we send referral to [facility]. Patient did not want me to do referral. He wants to think about it. I asked him if I could call him Monday and he told me no, he would come to the office to let us know what he decides to do for treatment. Butch Penny., RMA

## 2022-12-06 NOTE — Telephone Encounter (Signed)
-----   Message from [facility] sent at 12/06/2022  4:45 PM EDT ----- Diagnosis: 1. Skin , right dorsal wrist SQUAMOUS CELL CARCINOMA, KERATOACANTHOMA TYPE 2. Skin , left distal dorsal lateral forearm SQUAMOUS CELL CARCINOMA, KERATOACANTHOMA TYPE  For RIGHT DORSAL WRIST Please call with diagnosis and determine where the patient would like to have Mohs surgery.  Explanation: This is a squamous cell skin cancer that has grown beyond the surface of the skin and is invading the second layer of the skin. It has the potential to spread beyond the skin and threaten your health, so I recommend treating it.  Treatment: Given the location and type of skin cancer, I recommend Mohs surgery. Mohs surgery involves cutting out the skin cancer and then checking under the microscope to ensure the whole skin cancer was removed. If any skin cancer remains, the surgeon will cut out more until it is fully removed. The cure rate is about 98-99%. Once the Mohs surgeon confirms the skin cancer is out, they will discuss the options to repair or heal the area. You must take it easy for about two weeks after surgery (no lifting over 10-15 lbs, avoid activity to get your heart rate and blood pressure up). It is done at another office outside of [place] ([place], [place], or [place]).  If the patient asks for my recommendation for Mohs surgeon, I recommend Dr Caprice Beaver or Dr Coralie Carpen at [facility] if the patient is able to make the trip.  For LEFT FOREARM Treated during appointment. Recheck in 3 months

## 2022-12-08 ENCOUNTER — Encounter: Payer: Self-pay | Admitting: Dermatology

## 2022-12-11 DIAGNOSIS — H50112 Monocular exotropia, left eye: Secondary | ICD-10-CM | POA: Diagnosis not present

## 2022-12-11 DIAGNOSIS — H40003 Preglaucoma, unspecified, bilateral: Secondary | ICD-10-CM | POA: Diagnosis not present

## 2022-12-11 DIAGNOSIS — H2513 Age-related nuclear cataract, bilateral: Secondary | ICD-10-CM | POA: Diagnosis not present

## 2022-12-11 DIAGNOSIS — E119 Type 2 diabetes mellitus without complications: Secondary | ICD-10-CM | POA: Diagnosis not present

## 2022-12-11 LAB — HM DIABETES EYE EXAM

## 2022-12-12 ENCOUNTER — Encounter: Payer: Self-pay | Admitting: Nurse Practitioner

## 2022-12-18 NOTE — Telephone Encounter (Signed)
Called patient to inform him that a proxy form can be completed to gain access to  his my chart. Patient will come to the office and pick up form.

## 2022-12-18 NOTE — Telephone Encounter (Signed)
Copied from CRM [id number]. Topic: General - Other >> Dec 18, 2022 10:54 AM Macon Large wrote: Reason for CRM: Jack Crane pt's POA requests call back to advise what he needs to do to be added to the pt chart to have access to assist the pt. Cb# [phone]

## 2022-12-21 ENCOUNTER — Encounter: Payer: Self-pay | Admitting: Ophthalmology

## 2022-12-25 ENCOUNTER — Encounter: Payer: Self-pay | Admitting: Ophthalmology

## 2022-12-26 DIAGNOSIS — H2512 Age-related nuclear cataract, left eye: Secondary | ICD-10-CM | POA: Diagnosis not present

## 2023-01-01 ENCOUNTER — Other Ambulatory Visit: Payer: Self-pay | Admitting: Nurse Practitioner

## 2023-01-01 NOTE — Discharge Instructions (Signed)

## 2023-01-02 ENCOUNTER — Ambulatory Visit
Admission: RE | Admit: 2023-01-02 | Discharge: 2023-01-02 | Disposition: A | Discharge status: Home / Self Care | Payer: Medicare PPO | Attending: Ophthalmology | Admitting: Ophthalmology

## 2023-01-02 ENCOUNTER — Ambulatory Visit: Payer: Medicare PPO | Admitting: Anesthesiology

## 2023-01-02 ENCOUNTER — Encounter: Payer: Self-pay | Admitting: Ophthalmology

## 2023-01-02 ENCOUNTER — Encounter
Admission: RE | Disposition: A | Discharge status: Home / Self Care | Payer: Self-pay | Source: Home / Self Care | Attending: Ophthalmology

## 2023-01-02 ENCOUNTER — Other Ambulatory Visit: Payer: Self-pay

## 2023-01-02 DIAGNOSIS — H2512 Age-related nuclear cataract, left eye: Secondary | ICD-10-CM | POA: Diagnosis not present

## 2023-01-02 DIAGNOSIS — Z87891 Personal history of nicotine dependence: Secondary | ICD-10-CM | POA: Insufficient documentation

## 2023-01-02 DIAGNOSIS — N181 Chronic kidney disease, stage 1: Secondary | ICD-10-CM | POA: Diagnosis not present

## 2023-01-02 DIAGNOSIS — E1136 Type 2 diabetes mellitus with diabetic cataract: Secondary | ICD-10-CM | POA: Diagnosis not present

## 2023-01-02 DIAGNOSIS — Z7982 Long term (current) use of aspirin: Secondary | ICD-10-CM | POA: Insufficient documentation

## 2023-01-02 DIAGNOSIS — Z833 Family history of diabetes mellitus: Secondary | ICD-10-CM | POA: Diagnosis not present

## 2023-01-02 DIAGNOSIS — I129 Hypertensive chronic kidney disease with stage 1 through stage 4 chronic kidney disease, or unspecified chronic kidney disease: Secondary | ICD-10-CM | POA: Diagnosis not present

## 2023-01-02 DIAGNOSIS — E1122 Type 2 diabetes mellitus with diabetic chronic kidney disease: Secondary | ICD-10-CM | POA: Insufficient documentation

## 2023-01-02 DIAGNOSIS — Z7984 Long term (current) use of oral hypoglycemic drugs: Secondary | ICD-10-CM | POA: Insufficient documentation

## 2023-01-02 HISTORY — DX: Type 2 diabetes mellitus with diabetic chronic kidney disease: I12.9

## 2023-01-02 HISTORY — DX: Type 2 diabetes mellitus with diabetic chronic kidney disease: E11.22

## 2023-01-02 HISTORY — PX: CATARACT EXTRACTION W/PHACO: SHX586

## 2023-01-02 LAB — GLUCOSE, CAPILLARY: Glucose-Capillary: 96 mg/dL (ref 70–99)

## 2023-01-02 SURGERY — PHACOEMULSIFICATION, CATARACT, WITH IOL INSERTION
Anesthesia: Monitor Anesthesia Care | Laterality: Left

## 2023-01-02 MED ORDER — SIGHTPATH DOSE#1 BSS IO SOLN
INTRAOCULAR | Status: DC | PRN
  Administered 2023-01-02: 70 mL via OPHTHALMIC

## 2023-01-02 MED ORDER — SIGHTPATH DOSE#1 BSS IO SOLN
INTRAOCULAR | Status: DC | PRN
  Administered 2023-01-02: 15 mL via INTRAOCULAR

## 2023-01-02 MED ORDER — MIDAZOLAM HCL 2 MG/2ML IJ SOLN
INTRAMUSCULAR | Status: DC | PRN
  Administered 2023-01-02: 2 mg via INTRAVENOUS

## 2023-01-02 MED ORDER — SIGHTPATH DOSE#1 BSS IO SOLN
INTRAOCULAR | Status: DC | PRN
  Administered 2023-01-02: 2 mL

## 2023-01-02 MED ORDER — CEFUROXIME OPHTHALMIC INJECTION 1 MG/0.1 ML
INJECTION | OPHTHALMIC | Status: DC | PRN
  Administered 2023-01-02: 1 mg via INTRACAMERAL

## 2023-01-02 MED ORDER — BRIMONIDINE TARTRATE-TIMOLOL 0.2-0.5 % OP SOLN
OPHTHALMIC | Status: DC | PRN
  Administered 2023-01-02: 1 [drp] via OPHTHALMIC

## 2023-01-02 MED ORDER — LACTATED RINGERS IV SOLN: INTRAVENOUS | Status: DC

## 2023-01-02 MED ORDER — TETRACAINE HCL 0.5 % OP SOLN
1.0000 [drp] | OPHTHALMIC | Status: DC | PRN
  Administered 2023-01-02 (×3): 1 [drp] via OPHTHALMIC

## 2023-01-02 MED ORDER — MIDAZOLAM HCL 2 MG/2ML IJ SOLN: INTRAMUSCULAR | Status: DC | PRN

## 2023-01-02 MED ORDER — ARMC OPHTHALMIC DILATING DROPS
1.0000 | OPHTHALMIC | Status: DC | PRN
  Administered 2023-01-02 (×3): 1 via OPHTHALMIC

## 2023-01-02 MED ORDER — SIGHTPATH DOSE#1 NA HYALUR & NA CHOND-NA HYALUR IO KIT
PACK | INTRAOCULAR | Status: DC | PRN
  Administered 2023-01-02: 1 via OPHTHALMIC

## 2023-01-02 SURGICAL SUPPLY — 10 items
CATARACT SUITE SIGHTPATH (MISCELLANEOUS) ×1
FEE CATARACT SUITE SIGHTPATH (MISCELLANEOUS) ×1 IMPLANT
GLOVE SRG 8 PF TXTR STRL LF DI (GLOVE) ×1 IMPLANT
GLOVE SURG ENC TEXT LTX SZ7.5 (GLOVE) ×1 IMPLANT
GLOVE SURG UNDER POLY LF SZ8 (GLOVE) ×1
LENS DEVICE MILOOP (INTRAOCULAR LENS) IMPLANT
LENS IOL TECNIS EYHANCE 21.5 (Intraocular Lens) IMPLANT
NDL FILTER BLUNT 18X1 1/2 (NEEDLE) ×1 IMPLANT
NEEDLE FILTER BLUNT 18X1 1/2 (NEEDLE) ×1
SYR 3ML LL SCALE MARK (SYRINGE) ×1 IMPLANT

## 2023-01-02 NOTE — Op Note (Signed)
OPERATIVE NOTE  Jack Crane [id number] 01/02/2023   PREOPERATIVE DIAGNOSIS:  Nuclear sclerotic cataract left eye. H25.12   POSTOPERATIVE DIAGNOSIS:    Nuclear sclerotic cataract left eye.     PROCEDURE:  Phacoemusification with posterior chamber intraocular lens placement of the left eye  Ultrasound time: Procedure(s): CATARACT EXTRACTION PHACO AND INTRAOCULAR LENS PLACEMENT (IOC) LEFT MILOOP DIABETIC 8.83 00:48.8 (Left)  LENS:   Implant Name Type Inv. Item Serial No. Manufacturer Lot No. LRB No. Used Action  LENS IOL TECNIS EYHANCE 21.5 - [id number] Intraocular Lens LENS IOL TECNIS EYHANCE 21.5 [id number] [employer]  Left 1 Implanted      SURGEON:  Deirdre Evener, MD   ANESTHESIA:  Topical with tetracaine drops and 2% Xylocaine jelly, augmented with 1% preservative-free intracameral lidocaine.    COMPLICATIONS:  None.   DESCRIPTION OF PROCEDURE:  The patient was identified in the holding room and transported to the operating room and placed in the supine position under the operating microscope.  The left eye was identified as the operative eye and it was prepped and draped in the usual sterile ophthalmic fashion.   A 1 millimeter clear-corneal paracentesis was made at the 1:30 position.  0.5 ml of preservative-free 1% lidocaine was injected into the anterior chamber.  The anterior chamber was filled with Viscoat viscoelastic.  A 2.4 millimeter keratome was used to make a near-clear corneal incision at the 10:30 position.  .  A curvilinear capsulorrhexis was made with a cystotome and capsulorrhexis forceps.  Balanced salt solution was used to hydrodissect and hydrodelineate the nucleus. A MiLoop device was used to prechop the lens nucleus to facilitate phacoemulsification.    Phacoemulsification was then used in stop and chop fashion to remove the lens nucleus and epinucleus.  The remaining cortex was then removed using the irrigation and aspiration handpiece. Provisc was  then placed into the capsular bag to distend it for lens placement.  A lens was then injected into the capsular bag.  The remaining viscoelastic was aspirated.   Wounds were hydrated with balanced salt solution.  The anterior chamber was inflated to a physiologic pressure with balanced salt solution.  No wound leaks were noted. Cefuroxime 0.1 ml of a 10mg /ml solution was injected into the anterior chamber for a dose of 1 mg of intracameral antibiotic at the completion of the case.   Timolol and Brimonidine drops were applied to the eye.  The patient was taken to the recovery room in stable condition without complications of anesthesia or surgery.  Jack Crane 01/02/2023, 8:28 AM

## 2023-01-02 NOTE — Anesthesia Preprocedure Evaluation (Signed)
Anesthesia Evaluation  Patient identified by MRN, date of birth, ID band Patient awake    Reviewed: Allergy & Precautions, H&P , NPO status , Patient's Chart, lab work & pertinent test results, reviewed documented beta blocker date and time   History of Anesthesia Complications Negative for: history of anesthetic complications  Airway Mallampati: II  TM Distance: >3 FB Neck ROM: full    Dental  (+) Poor Dentition, Chipped, Missing, Dental Advidsory Given   Pulmonary neg pulmonary ROS, Continuous Positive Airway Pressure Ventilation , former smoker   Pulmonary exam normal breath sounds clear to auscultation       Cardiovascular Exercise Tolerance: Good hypertension, (-) angina (-) Past MI and (-) Cardiac Stents Normal cardiovascular exam(-) dysrhythmias (-) Valvular Problems/Murmurs Rhythm:regular Rate:Normal     Neuro/Psych neg Seizures negative neurological ROS  negative psych ROS   GI/Hepatic negative GI ROS, Neg liver ROS,,,  Endo/Other  negative endocrine ROSdiabetes    Renal/GU Renal disease  negative genitourinary   Musculoskeletal   Abdominal   Peds  Hematology negative hematology ROS (+)   Anesthesia Other Findings Past Medical History: 03/30/2019: Basal cell carcinoma     Comment:  L neck mid inframandibular - excision 05/12/2019 No date: Chronic kidney disease No date: Diabetes mellitus without complication (HCC) No date: Gout No date: Gout No date: Hypertension 11/29/2022: SCC (squamous cell carcinoma)     Comment:  right dorsal wrist, needs Mohs referral 11/29/2022: SCC (squamous cell carcinoma)     Comment:  left distal dorsal lateral forearm, tx'd with EDC 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     Comment:  L volar forearm - SCCIS  09/19/2022: Squamous cell carcinoma of skin     Comment:  L elbow, EDC No date: 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)    Reproductive/Obstetrics negative OB ROS                             Anesthesia Physical Anesthesia Plan  ASA: 2  Anesthesia Plan: MAC   Post-op Pain Management:    Induction: Intravenous  PONV Risk Score and Plan: 1  Airway Management Planned: Natural Airway and Nasal Cannula  Additional Equipment:   Intra-op Plan:   Post-operative Plan:   Informed Consent: I have reviewed the patients History and Physical, chart, labs and discussed the procedure including the risks, benefits and alternatives for the proposed anesthesia with the patient or authorized representative who has indicated his/her understanding and acceptance.     Dental Advisory Given  Plan Discussed with: Anesthesiologist, CRNA and Surgeon  Anesthesia Plan Comments:         Anesthesia Quick Evaluation

## 2023-01-02 NOTE — Telephone Encounter (Signed)
Requested Prescriptions  Pending Prescriptions Disp Refills   metFORMIN (GLUCOPHAGE) 1000 MG tablet [Pharmacy Med Name: METFORMIN HCL 1,000 MG TABLET] 180 tablet 1    Sig: TAKE 1 TABLET (1,000 MG TOTAL) BY MOUTH TWICE A DAY WITH FOOD     Endocrinology:  Diabetes - Biguanides Failed - 01/01/2023  1:40 AM      Failed - B12 Level in normal range and within 720 days    No results found for: "VITAMINB12"       Passed - Cr in normal range and within 360 days    Creatinine, Ser  Date Value Ref Range Status  08/07/2022 1.06 0.76 - 1.27 mg/dL Final         Passed - HBA1C is between 0 and 7.9 and within 180 days    Hemoglobin A1C  Date Value Ref Range Status  12/01/2015 7.4  Final   HB A1C (BAYER DCA - WAIVED)  Date Value Ref Range Status  04/20/2020 6.9 <7.0 % Final    Comment:                                          Diabetic Adult            <7.0                                       Healthy Adult        4.3 - 5.7                                                           (DCCT/NGSP) American Diabetes Association's Summary of Glycemic Recommendations for Adults with Diabetes: Hemoglobin A1c <7.0%. More stringent glycemic goals (A1c <6.0%) may further reduce complications at the cost of increased risk of hypoglycemia.    Hgb A1c MFr Bld  Date Value Ref Range Status  11/16/2022 6.6 (H) 4.8 - 5.6 % Final    Comment:             Prediabetes: 5.7 - 6.4          Diabetes: >6.4          Glycemic control for adults with diabetes: <7.0          Passed - eGFR in normal range and within 360 days    GFR calc Af Amer  Date Value Ref Range Status  04/20/2020 66 >59 mL/min/1.73 Final    Comment:    **In accordance with recommendations from the NKF-ASN Task force,**   [facility] is in the process of updating its eGFR calculation to the   2021 CKD-EPI creatinine equation that estimates kidney function   without a race variable.    GFR calc non Af Amer  Date Value Ref Range Status   04/20/2020 57 (L) >59 mL/min/1.73 Final   eGFR  Date Value Ref Range Status  08/07/2022 71 >59 mL/min/1.73 Final         Passed - Valid encounter within last 6 months    Recent Outpatient Visits           1 month ago Type 2 DM  with CKD stage 1 and hypertension (HCC)   [facility] Mecum, Erin E, PA-C   4 months ago Hypertension associated with diabetes [facility])   [facility] Larae Grooms, NP   7 months ago Annual physical exam   [facility] Larae Grooms, NP   11 months ago 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)   [facility] Larae Grooms, NP   1 year ago Herpes zoster without complication   [facility] Larae Grooms, NP       Future Appointments             In 1 month Larae Grooms, NP [facility]   In 8 months Deirdre Evener, MD  [facility]            Passed - CBC within normal limits and completed in the last 12 months    WBC  Date Value Ref Range Status  05/08/2022 5.8 3.4 - 10.8 x10E3/uL Final  10/31/2018 7.6 4.0 - 10.5 K/uL Final   RBC  Date Value Ref Range Status  05/08/2022 4.37 4.14 - 5.80 x10E6/uL Final  10/31/2018 4.57 4.22 - 5.81 MIL/uL Final   Hemoglobin  Date Value Ref Range Status  05/08/2022 13.5 13.0 - 17.7 g/dL Final   Hematocrit  Date Value Ref Range Status  05/08/2022 40.8 37.5 - 51.0 % Final   MCHC  Date Value Ref Range Status  05/08/2022 33.1 31.5 - 35.7 g/dL Final  16/01/9603 54.0 30.0 - 36.0 g/dL Final   [facility]  Date Value Ref Range Status  05/08/2022 30.9 26.6 - 33.0 pg Final  10/31/2018 30.4 26.0 - 34.0 pg Final   MCV  Date Value Ref Range Status  05/08/2022 93 79 - 97 fL Final   No results found for: "PLTCOUNTKUC", "LABPLAT", "POCPLA" RDW  Date Value Ref Range Status  05/08/2022 12.2 11.6 - 15.4 % Final

## 2023-01-02 NOTE — Transfer of Care (Signed)
Immediate Anesthesia Transfer of Care Note  Patient: Jack Crane  Procedure(s) Performed: CATARACT EXTRACTION PHACO AND INTRAOCULAR LENS PLACEMENT (IOC) LEFT MILOOP DIABETIC 8.83 00:48.8 (Left)  Patient Location: PACU  Anesthesia Type: MAC  Level of Consciousness: awake, alert  and patient cooperative  Airway and Oxygen Therapy: Patient Spontanous Breathing and Patient connected to supplemental oxygen  Post-op Assessment: Post-op Vital signs reviewed, Patient's Cardiovascular Status Stable, Respiratory Function Stable, Patent Airway and No signs of Nausea or vomiting  Post-op Vital Signs: Reviewed and stable  Complications: No notable events documented.

## 2023-01-02 NOTE — H&P (Signed)
[facility]   Primary Care Physician:  Larae Grooms, NP Ophthalmologist: Dr. Lockie Mola  Pre-Procedure History & Physical: HPI:  Jack Crane is a 82 y.o. male here for ophthalmic surgery.   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 Authorizing Provider  aspirin 81 MG tablet Take 81 mg by mouth daily.   Yes [provider]  atorvastatin (LIPITOR) 40 MG tablet TAKE 1 TABLET BY MOUTH EVERY DAY 10/30/22  Yes Mecum, Erin E, PA-C  diclofenac Sodium (VOLTAREN) 1 % GEL APPLY 4 G TOPICALLY 4 TIMES DAILY 07/06/22  Yes Larae Grooms, NP  glucose blood test strip 1 each by Other route as needed for other. Use as instructed   Yes [provider]  metFORMIN (GLUCOPHAGE) 1000 MG tablet TAKE 1 TABLET (1,000 MG TOTAL) BY MOUTH TWICE A DAY WITH FOOD 05/08/22  Yes Larae Grooms, NP  neomycin-polymyxin b-dexamethasone (MAXITROL) 3.5-10000-0.1 SUSP SMARTSIG:In Ear(s) Patient not taking: Reported on 12/21/2022 10/23/22   [provider]    Allergies as of 12/13/2022   (No Known Allergies)    Family History  Problem Relation Age of Onset   Diabetes Mother    Cancer Sister        lung   Diabetes Brother    Diabetes Brother     Diabetes Brother     Social History   Socioeconomic History   Marital status: Single    Spouse name: Not on file   Number of children: Not on file   Years of education: Not on file   Highest education level: 9th grade  Occupational History   Not on file  Tobacco Use   Smoking status: Former    Current packs/day: 0.00    Types: Cigarettes    Quit date: 11/02/1974    Years since quitting: 48.2   Smokeless tobacco: Current    Types: Chew  Vaping Use   Vaping status: Never Used  Substance and Sexual Activity   Alcohol use: No    Alcohol/week: 0.0 standard drinks of alcohol   Drug use: No   Sexual activity: Yes    Birth control/protection: None  Other Topics Concern   Not on file  Social History Narrative   Not on file   Social Determinants of Health   Financial Resource Strain: Low Risk  (12/23/2020)   Overall Financial Resource Strain (CARDIA)    Difficulty of Paying Living Expenses: Not hard at all  Food Insecurity: No Food Insecurity (12/23/2020)   Hunger Vital Sign    Worried About Running Out of Food in the Last Year: Never true    Ran  Out of Food in the Last Year: Never true  Transportation Needs: No Transportation Needs (12/23/2020)   PRAPARE - Administrator, Civil Service (Medical): No    Lack of Transportation (Non-Medical): No  Physical Activity: Inactive (12/23/2020)   Exercise Vital Sign    Days of Exercise per Week: 0 days    Minutes of Exercise per Session: 0 min  Stress: No Stress Concern Present (12/23/2020)   [employer] of Occupational Health - Occupational Stress Questionnaire    Feeling of Stress : Not at all  Social Connections: Moderately Isolated (12/06/2017)   Social Connection and Isolation Panel [NHANES]    Frequency of Communication with Friends and Family: Once a week    Frequency of Social Gatherings with Friends and Family: More than three times a week    Attends Religious Services: Never    Database administrator or  Organizations: No    Attends Banker Meetings: Never    Marital Status: Never married  Intimate Partner Violence: Not At Risk (12/06/2017)   Humiliation, Afraid, Rape, and Kick questionnaire    Fear of Current or Ex-Partner: No    Emotionally Abused: No    Physically Abused: No    Sexually Abused: No    Review of Systems: See HPI, otherwise negative ROS  Physical Exam: BP (!) 142/103   Temp (!) 97.2 F (36.2 C) (Temporal)   Resp 12   Ht 5' 9.02" (1.753 m)   Wt 96.1 kg   SpO2 100%   BMI 31.26 kg/m  General:   Alert,  pleasant and cooperative in NAD Head:  Normocephalic and atraumatic. Lungs:  Clear to auscultation.    Heart:  Regular rate and rhythm.   Impression/Plan: Jack Crane is here for ophthalmic surgery.  Risks, benefits, limitations, and alternatives regarding ophthalmic surgery have been reviewed with the patient.  Questions have been answered.  All parties agreeable.   Lockie Mola, MD  01/02/2023, 7:36 AM

## 2023-01-02 NOTE — Anesthesia Postprocedure Evaluation (Signed)
Anesthesia Post Note  Patient: Jack Crane  Procedure(s) Performed: CATARACT EXTRACTION PHACO AND INTRAOCULAR LENS PLACEMENT (IOC) LEFT MILOOP DIABETIC 8.83 00:48.8 (Left)  Patient location during evaluation: PACU Anesthesia Type: MAC Level of consciousness: awake and alert Pain management: pain level controlled Vital Signs Assessment: post-procedure vital signs reviewed and stable Respiratory status: spontaneous breathing, nonlabored ventilation, respiratory function stable and patient connected to nasal cannula oxygen Cardiovascular status: stable and blood pressure returned to baseline Postop Assessment: no apparent nausea or vomiting Anesthetic complications: no   No notable events documented.   Last Vitals:  Vitals:   01/02/23 0721  BP: (!) 142/103  Resp: 12  Temp: (!) 36.2 C  SpO2: 100%    Last Pain:  Vitals:   01/02/23 0721  TempSrc: Temporal  PainSc: 0-No pain                 Lenard Simmer

## 2023-01-29 DIAGNOSIS — H2511 Age-related nuclear cataract, right eye: Secondary | ICD-10-CM | POA: Diagnosis not present

## 2023-02-05 ENCOUNTER — Telehealth: Payer: Self-pay

## 2023-02-05 ENCOUNTER — Ambulatory Visit: Payer: Medicare PPO | Admitting: Dermatology

## 2023-02-05 DIAGNOSIS — Z7189 Other specified counseling: Secondary | ICD-10-CM

## 2023-02-05 DIAGNOSIS — C44622 Squamous cell carcinoma of skin of right upper limb, including shoulder: Secondary | ICD-10-CM

## 2023-02-05 DIAGNOSIS — C4492 Squamous cell carcinoma of skin, unspecified: Secondary | ICD-10-CM

## 2023-02-05 NOTE — Telephone Encounter (Addendum)
Patient seen in clinic today by Dr. Gwen Pounds concerning spot at right dorsal wrist. Patient expressed concern area was growing and would like to have treated here at office. Went over bx results and Dr. Michaelle Copas recommendations again with patient. Patient deferred having Mohs and would like to have treatment at office.   Patient evaluated and treated today with ED&C and bx preformed and sent for pathology to check margins. Patient advised if area does come back would recommend Mohs in future for treatment.   Patient advised to keep follow up in future as scheduled.     ----- Message from Elie Goody sent at 12/06/2022  4:45 PM EDT ----- Diagnosis: 1. Skin , right dorsal wrist SQUAMOUS CELL CARCINOMA, KERATOACANTHOMA TYPE 2. Skin , left distal dorsal lateral forearm SQUAMOUS CELL CARCINOMA, KERATOACANTHOMA TYPE  For RIGHT DORSAL WRIST Please call with diagnosis and determine where the patient would like to have Mohs surgery.  Explanation: This is a squamous cell skin cancer that has grown beyond the surface of the skin and is invading the second layer of the skin. It has the potential to spread beyond the skin and threaten your health, so I recommend treating it.  Treatment: Given the location and type of skin cancer, I recommend Mohs surgery. Mohs surgery involves cutting out the skin cancer and then checking under the microscope to ensure the whole skin cancer was removed. If any skin cancer remains, the surgeon will cut out more until it is fully removed. The cure rate is about 98-99%. Once the Mohs surgeon confirms the skin cancer is out, they will discuss the options to repair or heal the area. You must take it easy for about two weeks after surgery (no lifting over 10-15 lbs, avoid activity to get your heart rate and blood pressure up). It is done at another office outside of [place] ([place], [place], or [place]).  If the patient asks for my recommendation for Mohs surgeon,  I recommend Dr Caprice Beaver or Dr Coralie Carpen at [facility] if the patient is able to make the trip.  For LEFT FOREARM Treated during appointment. Recheck in 3 months

## 2023-02-05 NOTE — Progress Notes (Signed)
   Follow-Up Visit   Subjective  Jack Crane is a 82 y.o. male who presents for the following: concerned about a spot that was treated on right hand. Patient seen in August and had bx on same area. Bx proven SCC and was recommended to have Mohs to treat. Patient here today for second opinion and to see if he could have treatment here.   The patient has spots, moles and lesions to be evaluated, some may be new or changing and the patient may have concern these could be cancer.  The following portions of the chart were reviewed this encounter and updated as appropriate: medications, allergies, medical history  Review of Systems:  No other skin or systemic complaints except as noted in HPI or Assessment and Plan.  Objective  Well appearing patient in no apparent distress; mood and affect are within normal limits.   A focused examination was performed of the following areas: Right dorsal wrist  Relevant exam findings are noted in the Assessment and Plan.  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  Epidermal / dermal shaving  Lesion diameter (cm):  2.2 Informed consent: discussed and consent obtained   Timeout: patient name, date of birth, surgical site, and procedure verified   Procedure prep:  Patient was prepped and draped in usual sterile fashion Prep type:  Isopropyl alcohol Anesthesia: the lesion was anesthetized in a standard fashion   Anesthetic:  1% lidocaine w/ epinephrine 1-100,000 buffered w/ 8.4% NaHCO3 Instrument used: flexible razor blade   Hemostasis achieved with: pressure, aluminum chloride and electrodesiccation   Outcome: patient tolerated procedure well   Post-procedure details: sterile dressing applied and wound care instructions given   Dressing type: bandage and petrolatum    Destruction of lesion Complexity: extensive   Destruction method: electrodesiccation  and curettage   Informed consent: discussed and consent obtained   Timeout:  patient name, date of birth, surgical site, and procedure verified Procedure prep:  Patient was prepped and draped in usual sterile fashion Prep type:  Isopropyl alcohol Anesthesia: the lesion was anesthetized in a standard fashion   Anesthetic:  1% lidocaine w/ epinephrine 1-100,000 buffered w/ 8.4% NaHCO3 Curettage performed in three different directions: Yes   Electrodesiccation performed over the curetted area: Yes   Lesion length (cm):  2.2 Lesion width (cm):  2.2 Margin per side (cm):  0.2 Final wound size (cm):  2.6 Hemostasis achieved with:  pressure, aluminum chloride and electrodesiccation Outcome: patient tolerated procedure well with no complications   Post-procedure details: sterile dressing applied and wound care instructions given   Dressing type: bandage and petrolatum    Specimen 1 - Surgical pathology Differential Diagnosis: Bx proven SCC recheck margins at right dorsal wrist  Check Margins: Yes  Refer to Accession: [id number] 11/29/2022  Bx proven SCC recheck margins  Discussed bx results with patient ED&C vs Mohs. Patient prefers [facility] treatment today. Will recommend Mohs in future if returns.       Return for keep follow up in may 2025.  IAsher Muir, CMA, am acting as scribe for Armida Sans, MD.   Documentation: I have reviewed the above documentation for accuracy and completeness, and I agree with the above.  Armida Sans, MD

## 2023-02-05 NOTE — Patient Instructions (Addendum)
Electrodesiccation and Curettage ("Scrape and Burn") Wound Care Instructions  Leave the original bandage on for 24 hours if possible.  If the bandage becomes soaked or soiled before that time, it is OK to remove it and examine the wound.  A small amount of post-operative bleeding is normal.  If excessive bleeding occurs, remove the bandage, place gauze over the site and apply continuous pressure (no peeking) over the area for 30 minutes. If this does not work, please call our clinic as soon as possible or page your doctor if it is after hours.   Once a day, cleanse the wound with soap and water. It is fine to shower. If a thick crust develops you may use a Q-tip dipped into dilute hydrogen peroxide (mix 1:1 with water) to dissolve it.  Hydrogen peroxide can slow the healing process, so use it only as needed.    After washing, apply petroleum jelly (Vaseline) or an antibiotic ointment if your doctor prescribed one for you, followed by a bandage.    For best healing, the wound should be covered with a layer of ointment at all times. If you are not able to keep the area covered with a bandage to hold the ointment in place, this may mean re-applying the ointment several times a day.  Continue this wound care until the wound has healed and is no longer open. It may take several weeks for the wound to heal and close.  Itching and mild discomfort is normal during the healing process.  If you have any discomfort, you can take Tylenol (acetaminophen) or ibuprofen as directed on the bottle. (Please do not take these if you have an allergy to them or cannot take them for another reason).  Some redness, tenderness and white or yellow material in the wound is normal healing.  If the area becomes very sore and red, or develops a thick yellow-green material (pus), it may be infected; please notify us.    Wound healing continues for up to one year following surgery. It is not unusual to experience pain in the scar  from time to time during the interval.  If the pain becomes severe or the scar thickens, you should notify the office.    A slight amount of redness in a scar is expected for the first six months.  After six months, the redness will fade and the scar will soften and fade.  The color difference becomes less noticeable with time.  If there are any problems, return for a post-op surgery check at your earliest convenience.  To improve the appearance of the scar, you can use silicone scar gel, cream, or sheets (such as Mederma or Serica) every night for up to one year. These are available over the counter (without a prescription).  Please call our office at [phone] for any questions or concerns.   Biopsy Wound Care Instructions  Leave the original bandage on for 24 hours if possible.  If the bandage becomes soaked or soiled before that time, it is OK to remove it and examine the wound.  A small amount of post-operative bleeding is normal.  If excessive bleeding occurs, remove the bandage, place gauze over the site and apply continuous pressure (no peeking) over the area for 30 minutes. If this does not work, please call our clinic as soon as possible or page your doctor if it is after hours.   Once a day, cleanse the wound with soap and water. It is fine to shower.  If a thick crust develops you may use a Q-tip dipped into dilute hydrogen peroxide (mix 1:1 with water) to dissolve it.  Hydrogen peroxide can slow the healing process, so use it only as needed.    After washing, apply petroleum jelly (Vaseline) or an antibiotic ointment if your doctor prescribed one for you, followed by a bandage.    For best healing, the wound should be covered with a layer of ointment at all times. If you are not able to keep the area covered with a bandage to hold the ointment in place, this may mean re-applying the ointment several times a day.  Continue this wound care until the wound has healed and is no longer  open.   Itching and mild discomfort is normal during the healing process. However, if you develop pain or severe itching, please call our office.   If you have any discomfort, you can take Tylenol (acetaminophen) or ibuprofen as directed on the bottle. (Please do not take these if you have an allergy to them or cannot take them for another reason).  Some redness, tenderness and white or yellow material in the wound is normal healing.  If the area becomes very sore and red, or develops a thick yellow-green material (pus), it may be infected; please notify us.    If you have stitches, return to clinic as directed to have the stitches removed. You will continue wound care for 2-3 days after the stitches are removed.   Wound healing continues for up to one year following surgery. It is not unusual to experience pain in the scar from time to time during the interval.  If the pain becomes severe or the scar thickens, you should notify the office.    A slight amount of redness in a scar is expected for the first six months.  After six months, the redness will fade and the scar will soften and fade.  The color difference becomes less noticeable with time.  If there are any problems, return for a post-op surgery check at your earliest convenience.  To improve the appearance of the scar, you can use silicone scar gel, cream, or sheets (such as Mederma or Serica) every night for up to one year. These are available over the counter (without a prescription).  Please call our office at [phone] for any questions or concerns.       Due to recent changes in healthcare laws, you may see results of your pathology and/or laboratory studies on MyChart before the doctors have had a chance to review them. We understand that in some cases there may be results that are confusing or concerning to you. Please understand that not all results are received at the same time and often the doctors may need to interpret  multiple results in order to provide you with the best plan of care or course of treatment. Therefore, we ask that you please give [place] 2 business days to thoroughly review all your results before contacting the office for clarification. Should we see a critical lab result, you will be contacted sooner.   If You Need Anything After Your Visit  If you have any questions or concerns for your doctor, please call our main line at [phone] and press option 4 to reach your doctor's medical assistant. If no one answers, please leave a voicemail as directed and we will return your call as soon as possible. Messages left after 4 pm will be answered the following business day.  You may also send [place] a message via MyChart. We typically respond to MyChart messages within 1-2 business days.  For prescription refills, please ask your pharmacy to contact our office. Our fax number is [phone].  If you have an urgent issue when the clinic is closed that cannot wait until the next business day, you can page your doctor at the number below.    Please note that while we do our best to be available for urgent issues outside of office hours, we are not available 24/7.   If you have an urgent issue and are unable to reach [place], you may choose to seek medical care at your doctor's office, retail clinic, urgent care center, or emergency room.  If you have a medical emergency, please immediately call 911 or go to the emergency department.  Pager Numbers  - Dr. Gwen Pounds: [phone]  - Dr. Roseanne Reno: [phone]  - Dr. Katrinka Blazing: [phone]   In the event of inclement weather, please call our main line at [phone] for an update on the status of any delays or closures.  Dermatology Medication Tips: Please keep the boxes that topical medications come in in order to help keep track of the instructions about where and how to use these. Pharmacies typically print the medication instructions only on the boxes  and not directly on the medication tubes.   If your medication is too expensive, please contact our office at [phone] option 4 or send [place] a message through MyChart.   We are unable to tell what your co-pay for medications will be in advance as this is different depending on your insurance coverage. However, we may be able to find a substitute medication at lower cost or fill out paperwork to get insurance to cover a needed medication.   If a prior authorization is required to get your medication covered by your insurance company, please allow [place] 1-2 business days to complete this process.  Drug prices often vary depending on where the prescription is filled and some pharmacies may offer cheaper prices.  The website www.goodrx.com contains coupons for medications through different pharmacies. The prices here do not account for what the cost may be with help from insurance (it may be cheaper with your insurance), but the website can give you the price if you did not use any insurance.  - You can print the associated coupon and take it with your prescription to the pharmacy.  - You may also stop by our office during regular business hours and pick up a GoodRx coupon card.  - If you need your prescription sent electronically to a different pharmacy, notify our office through [facility] or by phone at [phone] option 4.     Si Usted Necesita Algo Despus de Su Visita  Tambin puede enviarnos un mensaje a travs de Clinical cytogeneticist. Por lo general respondemos a los mensajes de MyChart en el transcurso de 1 a 2 das hbiles.  Para renovar recetas, por favor pida a su farmacia que se ponga en contacto con nuestra oficina. Annie Sable de fax es [place] [phone].  Si tiene un asunto urgente cuando la clnica est cerrada y que no puede esperar hasta el siguiente da hbil, puede llamar/localizar a su doctor(a) al nmero que aparece a continuacin.   Por favor, tenga en cuenta que aunque  hacemos todo lo posible para estar disponibles para asuntos urgentes fuera del horario de [place], no estamos disponibles las 24 horas del da, los 7 [address]  [address] de la [place].  Si tiene un problema urgente y no puede comunicarse con nosotros, puede optar por buscar atencin mdica  en el consultorio de su doctor(a), en una clnica privada, en un centro de atencin urgente o en una sala de emergencias.  Si tiene Engineer, drilling, por favor llame inmediatamente al 911 o vaya a la sala de emergencias.  Nmeros de bper  - Dr. Gwen Pounds: [phone]  - Dra. Roseanne Reno: [phone]  - Dr. Katrinka Blazing: [phone]   En caso de inclemencias del tiempo, por favor llame a Lacy Duverney principal al [phone] para una actualizacin sobre el [place] de cualquier retraso o cierre.  Consejos para la medicacin en dermatologa: Por favor, guarde las cajas en las que vienen los medicamentos de uso tpico para ayudarle a seguir las instrucciones sobre dnde y cmo usarlos. Las farmacias generalmente imprimen las instrucciones del medicamento slo en las cajas y no directamente en los tubos del [place].   Si su medicamento es muy caro, por favor, pngase en contacto con Rolm Gala llamando al [phone] y presione la opcin 4 o envenos un mensaje a travs de Clinical cytogeneticist.   No podemos decirle cul ser su copago por los medicamentos por adelantado ya que esto es diferente dependiendo de la cobertura de su seguro. Sin embargo, es posible que podamos encontrar un medicamento sustituto a Audiological scientist un formulario para que el seguro cubra el medicamento que se considera necesario.   Si se requiere una autorizacin previa para que su compaa de seguros [place] su medicamento, por favor permtanos de 1 a 2 das hbiles para completar [address]  Los precios de los medicamentos varan con frecuencia dependiendo del Environmental consultant de dnde se surte la receta y alguna farmacias pueden ofrecer precios ms  baratos.  El sitio web www.goodrx.com tiene cupones para medicamentos de Health and safety inspector. Los precios aqu no tienen en cuenta lo que podra costar con la ayuda del seguro (puede ser ms barato con su seguro), pero el sitio web puede darle el precio si no utiliz Tourist information centre manager.  - Puede imprimir el cupn correspondiente y llevarlo con su receta a la farmacia.  - Tambin puede pasar por nuestra oficina durante el horario de atencin regular y Education officer, museum una tarjeta de cupones de GoodRx.  - Si necesita que su receta se enve electrnicamente a una farmacia diferente, informe a nuestra oficina a travs de MyChart de [place] o por telfono llamando al [phone] y presione la opcin 4.

## 2023-02-06 ENCOUNTER — Encounter: Payer: Self-pay | Admitting: Ophthalmology

## 2023-02-06 NOTE — Progress Notes (Unsigned)
There were no vitals taken for this visit.   Subjective:    [name], male    DOB: 02/07/1941, 82 y.o.   MRN: [id number]  HPI: Jack Crane is a 82 y.o. male  No chief complaint on file.  HYPERTENSION / HYPERLIPIDEMIA Doing well, no concerns at visit today.  Satisfied with current treatment? yes Duration of hypertension: years BP monitoring frequency: daily BP range: 114-140/70-80 BP medication side effects: no Past BP meds: none Duration of hyperlipidemia: years Cholesterol medication side effects: no Cholesterol supplements: none Past cholesterol medications: atorvastain (lipitor) Medication compliance: excellent compliance Aspirin: yes Recent stressors: no Recurrent headaches: no Visual changes: no Palpitations: no Dyspnea: no Chest pain: no Lower extremity edema: no Dizzy/lightheaded: no  DIABETES Last A1c 6.7% in January. Hypoglycemic episodes:no Polydipsia/polyuria: no Visual disturbance: no Chest pain: no Paresthesias: no Glucose Monitoring: yes  Accucheck frequency: daily  Fasting glucose: 170s  Post prandial:  Evening:  Before meals: Taking Insulin?: no  Long acting insulin:  Short acting insulin: Blood Pressure Monitoring: not checking Retinal Examination: Up to Date Foot Exam: Up to Date Diabetic Education: Not Completed Pneumovax: Up to Date Influenza: Up to Date Aspirin: yes   Relevant past medical, surgical, family and social history reviewed and updated as indicated. Interim medical history since our last visit reviewed. Allergies and medications reviewed and updated.  Review of Systems  Eyes:  Negative for visual disturbance.  Respiratory:  Negative for chest tightness and shortness of breath.   Cardiovascular:  Negative for chest pain, palpitations and leg swelling.  Endocrine: Negative for polydipsia and polyuria.  Neurological:  Negative for dizziness, light-headedness, numbness and headaches.    Per HPI  unless specifically indicated above     Objective:    There were no vitals taken for this visit.  Wt Readings from Last 3 Encounters:  01/02/23 211 lb 12.8 oz (96.1 kg)  11/09/22 208 lb 3.2 oz (94.4 kg)  08/07/22 208 lb 14.4 oz (94.8 kg)    Physical Exam Vitals and nursing note reviewed.  Constitutional:      General: He is not in acute distress.    Appearance: Normal appearance. He is obese. He is not ill-appearing, toxic-appearing or diaphoretic.  HENT:     Head: Normocephalic.     Right Ear: External ear normal.     Left Ear: External ear normal.     Nose: Nose normal. No congestion or rhinorrhea.     Mouth/Throat:     Mouth: Mucous membranes are moist.  Eyes:     General:        Right eye: No discharge.        Left eye: No discharge.     Extraocular Movements: Extraocular movements intact.     Conjunctiva/sclera: Conjunctivae normal.     Pupils: Pupils are equal, round, and reactive to light.  Cardiovascular:     Rate and Rhythm: Normal rate and regular rhythm.     Heart sounds: No murmur heard. Pulmonary:     Effort: Pulmonary effort is normal. No respiratory distress.     Breath sounds: Normal breath sounds. No wheezing, rhonchi or rales.  Abdominal:     General: Abdomen is flat. Bowel sounds are normal.  Musculoskeletal:     Cervical back: Normal range of motion and neck supple.  Skin:    General: Skin is warm and dry.     Capillary Refill: Capillary refill takes less than 2 seconds.  Neurological:  General: No focal deficit present.     Mental Status: He is alert and oriented to person, place, and time.  Psychiatric:        Mood and Affect: Mood normal.        Behavior: Behavior normal.        Thought Content: Thought content normal.        Judgment: Judgment normal.     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

## 2023-02-07 ENCOUNTER — Encounter: Payer: Self-pay | Admitting: Nurse Practitioner

## 2023-02-07 ENCOUNTER — Ambulatory Visit: Payer: Medicare PPO | Admitting: Nurse Practitioner

## 2023-02-07 VITALS — BP 130/78 | HR 65 | Temp 97.8°F | Wt 212.2 lb

## 2023-02-07 DIAGNOSIS — E1122 Type 2 diabetes mellitus with diabetic chronic kidney disease: Secondary | ICD-10-CM | POA: Diagnosis not present

## 2023-02-07 DIAGNOSIS — E7800 Pure hypercholesterolemia, unspecified: Secondary | ICD-10-CM | POA: Diagnosis not present

## 2023-02-07 DIAGNOSIS — Z23 Encounter for immunization: Secondary | ICD-10-CM

## 2023-02-07 DIAGNOSIS — D692 Other nonthrombocytopenic purpura: Secondary | ICD-10-CM

## 2023-02-07 DIAGNOSIS — Z7985 Long-term (current) use of injectable non-insulin antidiabetic drugs: Secondary | ICD-10-CM

## 2023-02-07 DIAGNOSIS — Z6830 Body mass index (BMI) 30.0-30.9, adult: Secondary | ICD-10-CM

## 2023-02-07 DIAGNOSIS — E66811 Obesity, class 1: Secondary | ICD-10-CM

## 2023-02-07 DIAGNOSIS — E1159 Type 2 diabetes mellitus with other circulatory complications: Secondary | ICD-10-CM | POA: Diagnosis not present

## 2023-02-07 DIAGNOSIS — N181 Chronic kidney disease, stage 1: Secondary | ICD-10-CM | POA: Diagnosis not present

## 2023-02-07 DIAGNOSIS — I129 Hypertensive chronic kidney disease with stage 1 through stage 4 chronic kidney disease, or unspecified chronic kidney disease: Secondary | ICD-10-CM | POA: Diagnosis not present

## 2023-02-07 DIAGNOSIS — E6609 Other obesity due to excess calories: Secondary | ICD-10-CM

## 2023-02-07 DIAGNOSIS — I152 Hypertension secondary to endocrine disorders: Secondary | ICD-10-CM | POA: Diagnosis not present

## 2023-02-07 LAB — SURGICAL PATHOLOGY

## 2023-02-07 MED ORDER — METFORMIN HCL 1000 MG PO TABS: ORAL_TABLET | ORAL | 1 refills | Status: DC

## 2023-02-07 MED ORDER — ATORVASTATIN CALCIUM 40 MG PO TABS
40.0000 mg | ORAL_TABLET | Freq: Every day | ORAL | 1 refills | Status: DC
Start: 2023-02-07 — End: 2023-05-10

## 2023-02-07 NOTE — Assessment & Plan Note (Signed)
Chronic.  Controlled with Atorvastatin 40mg  daily.  Refills sent today.  Labs ordered today.  Return to clinic in 3 months for reevaluation.  Call sooner if concerns arise.

## 2023-02-07 NOTE — Assessment & Plan Note (Signed)
Chronic.  Controlled without medication.  Would benefit from ACE or ARB for kidney protection, however, patient has had hypotension in the past. Will continue to monitor for elevated blood pressures in the future.  Patient reports recent episodes of dizziness.  Encouraged patient to increase water intake.  Labs ordered today.  Return to clinic in 3 months for reevaluation.  Call sooner if concerns arise.

## 2023-02-07 NOTE — Assessment & Plan Note (Signed)
Recommended eating smaller high protein, low fat meals more frequently and exercising 30 mins a day 5 times a week with a goal of 10-15lb weight loss in the next 3 months.  

## 2023-02-07 NOTE — Assessment & Plan Note (Signed)
Reassured patient.  Does have a skin tear on left arm.

## 2023-02-08 LAB — COMPREHENSIVE METABOLIC PANEL
ALT: 10 [IU]/L (ref 0–44)
AST: 18 [IU]/L (ref 0–40)
Albumin: 4.4 g/dL (ref 3.7–4.7)
Alkaline Phosphatase: 83 [IU]/L (ref 44–121)
BUN/Creatinine Ratio: 12 (ref 10–24)
BUN: 14 mg/dL (ref 8–27)
Bilirubin Total: 0.5 mg/dL (ref 0.0–1.2)
CO2: 26 mmol/L (ref 20–29)
Calcium: 9 mg/dL (ref 8.6–10.2)
Chloride: 104 mmol/L (ref 96–106)
Creatinine, Ser: 1.13 mg/dL (ref 0.76–1.27)
Globulin, Total: 2.3 g/dL (ref 1.5–4.5)
Glucose: 116 mg/dL — ABNORMAL HIGH (ref 70–99)
Potassium: 4.4 mmol/L (ref 3.5–5.2)
Sodium: 144 mmol/L (ref 134–144)
Total Protein: 6.7 g/dL (ref 6.0–8.5)
eGFR: 65 mL/min/{1.73_m2} (ref >59)

## 2023-02-08 LAB — LIPID PANEL
Chol/HDL Ratio: 3.1 {ratio} (ref 0.0–5.0)
Cholesterol, Total: 132 mg/dL (ref 100–199)
HDL: 42 mg/dL (ref >39)
LDL Chol Calc (NIH): 73 mg/dL (ref 0–99)
Triglycerides: 86 mg/dL (ref 0–149)
VLDL Cholesterol Cal: 17 mg/dL (ref 5–40)

## 2023-02-08 LAB — HEMOGLOBIN A1C
Est. average glucose Bld gHb Est-mCnc: 154 mg/dL
Hgb A1c MFr Bld: 7 % — ABNORMAL HIGH (ref 4.8–5.6)

## 2023-02-08 NOTE — Progress Notes (Signed)
Please let patient know that his lab work shows that his A1c increased to 7.0%.  I recommend decreasing carbohydrate and sugar intake to help with this.  Continue with current medication regimen.  Follow up as discussed.

## 2023-02-11 ENCOUNTER — Telehealth: Payer: Self-pay

## 2023-02-11 NOTE — Telephone Encounter (Signed)
Discussed biopsy results with patient 

## 2023-02-11 NOTE — Discharge Instructions (Signed)

## 2023-02-12 ENCOUNTER — Encounter: Payer: Self-pay | Admitting: Dermatology

## 2023-02-13 ENCOUNTER — Encounter
Admission: RE | Disposition: A | Discharge status: Home / Self Care | Payer: Self-pay | Source: Home / Self Care | Attending: Ophthalmology

## 2023-02-13 ENCOUNTER — Ambulatory Visit: Payer: Medicare PPO | Admitting: Anesthesiology

## 2023-02-13 ENCOUNTER — Ambulatory Visit
Admission: RE | Admit: 2023-02-13 | Discharge: 2023-02-13 | Disposition: A | Discharge status: Home / Self Care | Payer: Medicare PPO | Attending: Ophthalmology | Admitting: Ophthalmology

## 2023-02-13 ENCOUNTER — Other Ambulatory Visit: Payer: Self-pay

## 2023-02-13 ENCOUNTER — Encounter: Payer: Self-pay | Admitting: Ophthalmology

## 2023-02-13 DIAGNOSIS — H2511 Age-related nuclear cataract, right eye: Secondary | ICD-10-CM | POA: Diagnosis not present

## 2023-02-13 DIAGNOSIS — G4730 Sleep apnea, unspecified: Secondary | ICD-10-CM | POA: Diagnosis not present

## 2023-02-13 DIAGNOSIS — I10 Essential (primary) hypertension: Secondary | ICD-10-CM | POA: Diagnosis not present

## 2023-02-13 DIAGNOSIS — N181 Chronic kidney disease, stage 1: Secondary | ICD-10-CM | POA: Diagnosis not present

## 2023-02-13 DIAGNOSIS — Z87891 Personal history of nicotine dependence: Secondary | ICD-10-CM | POA: Diagnosis not present

## 2023-02-13 DIAGNOSIS — E669 Obesity, unspecified: Secondary | ICD-10-CM | POA: Insufficient documentation

## 2023-02-13 DIAGNOSIS — Z7982 Long term (current) use of aspirin: Secondary | ICD-10-CM | POA: Insufficient documentation

## 2023-02-13 DIAGNOSIS — E1136 Type 2 diabetes mellitus with diabetic cataract: Secondary | ICD-10-CM | POA: Insufficient documentation

## 2023-02-13 DIAGNOSIS — E1122 Type 2 diabetes mellitus with diabetic chronic kidney disease: Secondary | ICD-10-CM | POA: Insufficient documentation

## 2023-02-13 DIAGNOSIS — Z6834 Body mass index (BMI) 34.0-34.9, adult: Secondary | ICD-10-CM | POA: Diagnosis not present

## 2023-02-13 DIAGNOSIS — Z7984 Long term (current) use of oral hypoglycemic drugs: Secondary | ICD-10-CM | POA: Insufficient documentation

## 2023-02-13 DIAGNOSIS — Z833 Family history of diabetes mellitus: Secondary | ICD-10-CM | POA: Diagnosis not present

## 2023-02-13 DIAGNOSIS — Z791 Long term (current) use of non-steroidal anti-inflammatories (NSAID): Secondary | ICD-10-CM | POA: Insufficient documentation

## 2023-02-13 DIAGNOSIS — M109 Gout, unspecified: Secondary | ICD-10-CM | POA: Insufficient documentation

## 2023-02-13 DIAGNOSIS — Z79899 Other long term (current) drug therapy: Secondary | ICD-10-CM | POA: Insufficient documentation

## 2023-02-13 DIAGNOSIS — I129 Hypertensive chronic kidney disease with stage 1 through stage 4 chronic kidney disease, or unspecified chronic kidney disease: Secondary | ICD-10-CM | POA: Diagnosis not present

## 2023-02-13 HISTORY — PX: CATARACT EXTRACTION W/PHACO: SHX586

## 2023-02-13 LAB — GLUCOSE, CAPILLARY: Glucose-Capillary: 92 mg/dL (ref 70–99)

## 2023-02-13 SURGERY — PHACOEMULSIFICATION, CATARACT, WITH IOL INSERTION
Anesthesia: Monitor Anesthesia Care | Site: Eye | Laterality: Right

## 2023-02-13 MED ORDER — SIGHTPATH DOSE#1 BSS IO SOLN
INTRAOCULAR | Status: DC | PRN
  Administered 2023-02-13: 15 mL via INTRAOCULAR

## 2023-02-13 MED ORDER — TETRACAINE HCL 0.5 % OP SOLN
OPHTHALMIC | Status: AC
  Filled 2023-02-13: qty 4

## 2023-02-13 MED ORDER — SIGHTPATH DOSE#1 BSS IO SOLN
INTRAOCULAR | Status: DC | PRN
  Administered 2023-02-13: 2 mL

## 2023-02-13 MED ORDER — SIGHTPATH DOSE#1 BSS IO SOLN
INTRAOCULAR | Status: DC | PRN
  Administered 2023-02-13: 80 mL via OPHTHALMIC

## 2023-02-13 MED ORDER — CEFUROXIME OPHTHALMIC INJECTION 1 MG/0.1 ML
INJECTION | OPHTHALMIC | Status: DC | PRN
  Administered 2023-02-13: .1 mL via INTRACAMERAL

## 2023-02-13 MED ORDER — MIDAZOLAM HCL 2 MG/2ML IJ SOLN
INTRAMUSCULAR | Status: AC
  Filled 2023-02-13: qty 2

## 2023-02-13 MED ORDER — TETRACAINE HCL 0.5 % OP SOLN
1.0000 [drp] | OPHTHALMIC | Status: DC | PRN
  Administered 2023-02-13 (×3): 1 [drp] via OPHTHALMIC

## 2023-02-13 MED ORDER — ARMC OPHTHALMIC DILATING DROPS
OPHTHALMIC | Status: AC
  Filled 2023-02-13: qty 0.5

## 2023-02-13 MED ORDER — MIDAZOLAM HCL 2 MG/2ML IJ SOLN
INTRAMUSCULAR | Status: DC | PRN
  Administered 2023-02-13: 2 mg via INTRAVENOUS

## 2023-02-13 MED ORDER — SODIUM CHLORIDE 0.9% FLUSH: 10.0000 mL | Freq: Two times a day (BID) | INTRAVENOUS | Status: DC

## 2023-02-13 MED ORDER — BRIMONIDINE TARTRATE-TIMOLOL 0.2-0.5 % OP SOLN
OPHTHALMIC | Status: DC | PRN
  Administered 2023-02-13: 1 [drp] via OPHTHALMIC

## 2023-02-13 MED ORDER — NEOMYCIN-POLYMYXIN-DEXAMETH 3.5-10000-0.1 OP OINT
TOPICAL_OINTMENT | OPHTHALMIC | Status: DC | PRN
Start: 2023-02-13 — End: 2023-02-13
  Administered 2023-02-13: 1 via OPHTHALMIC

## 2023-02-13 MED ORDER — SIGHTPATH DOSE#1 NA HYALUR & NA CHOND-NA HYALUR IO KIT
PACK | INTRAOCULAR | Status: DC | PRN
  Administered 2023-02-13: 1 via OPHTHALMIC

## 2023-02-13 MED ORDER — ARMC OPHTHALMIC DILATING DROPS
1.0000 | OPHTHALMIC | Status: DC | PRN
  Administered 2023-02-13 (×3): 1 via OPHTHALMIC

## 2023-02-13 SURGICAL SUPPLY — 13 items
CANNULA ANT/CHMB 27G (MISCELLANEOUS) IMPLANT
CANNULA ANT/CHMB 27GA (MISCELLANEOUS)
CATARACT SUITE SIGHTPATH (MISCELLANEOUS) ×1
DEVICE MILOOP (MISCELLANEOUS) IMPLANT
FEE CATARACT SUITE SIGHTPATH (MISCELLANEOUS) ×1 IMPLANT
GLOVE SRG 8 PF TXTR STRL LF DI (GLOVE) ×1 IMPLANT
GLOVE SURG ENC TEXT LTX SZ7.5 (GLOVE) ×1 IMPLANT
GLOVE SURG UNDER POLY LF SZ8 (GLOVE) ×1
LENS IOL TECNIS EYHANCE 20.5 (Intraocular Lens) IMPLANT
MILOOP DEVICE (MISCELLANEOUS) ×1
NDL FILTER BLUNT 18X1 1/2 (NEEDLE) ×1 IMPLANT
NEEDLE FILTER BLUNT 18X1 1/2 (NEEDLE) ×1
SYR 3ML LL SCALE MARK (SYRINGE) ×1 IMPLANT

## 2023-02-13 NOTE — Anesthesia Postprocedure Evaluation (Signed)
Anesthesia Post Note  Patient: Jack Crane  Procedure(s) Performed: CATARACT EXTRACTION PHACO AND INTRAOCULAR LENS PLACEMENT (IOC) RIGHT DIABETIC 15.65 01:23.6 (Right: Eye)  Patient location during evaluation: PACU Anesthesia Type: MAC Level of consciousness: awake and alert, oriented and patient cooperative Pain management: pain level controlled Vital Signs Assessment: post-procedure vital signs reviewed and stable Respiratory status: spontaneous breathing, nonlabored ventilation and respiratory function stable Cardiovascular status: blood pressure returned to baseline and stable Postop Assessment: adequate PO intake Anesthetic complications: no   No notable events documented.   Last Vitals:  Vitals:   02/13/23 1052 02/13/23 1056  BP: (!) 130/104   Pulse: 60   Resp: 18   Temp: (!) 36.4 C (!) 36.4 C  SpO2: 98%     Last Pain:  Vitals:   02/13/23 1056  TempSrc:   PainSc: 0-No pain                 Reed Breech

## 2023-02-13 NOTE — Anesthesia Preprocedure Evaluation (Signed)
Anesthesia Evaluation  Patient identified by MRN, date of birth, ID band Patient awake    Reviewed: Allergy & Precautions, NPO status , Patient's Chart, lab work & pertinent test results  History of Anesthesia Complications Negative for: history of anesthetic complications  Airway Mallampati: III   Neck ROM: Full    Dental  (+) Missing   Pulmonary sleep apnea and Continuous Positive Airway Pressure Ventilation , former smoker (quit 10 years ago)   Pulmonary exam normal breath sounds clear to auscultation       Cardiovascular hypertension, Normal cardiovascular exam Rhythm:Regular Rate:Normal     Neuro/Psych negative neurological ROS     GI/Hepatic negative GI ROS,,,  Endo/Other  diabetes, Type 2  Obesity   Renal/GU Renal disease (stage I CKD)     Musculoskeletal Gout    Abdominal   Peds  Hematology negative hematology ROS (+)   Anesthesia Other Findings   Reproductive/Obstetrics                             Anesthesia Physical Anesthesia Plan  ASA: 2  Anesthesia Plan: MAC   Post-op Pain Management:    Induction: Intravenous  PONV Risk Score and Plan: 1 and Treatment may vary due to age or medical condition, Midazolam and TIVA  Airway Management Planned: Natural Airway and Nasal Cannula  Additional Equipment:   Intra-op Plan:   Post-operative Plan:   Informed Consent: I have reviewed the patients History and Physical, chart, labs and discussed the procedure including the risks, benefits and alternatives for the proposed anesthesia with the patient or authorized representative who has indicated his/her understanding and acceptance.     Dental advisory given  Plan Discussed with: CRNA  Anesthesia Plan Comments: (LMA/GETA backup discussed.  Patient consented for risks of anesthesia including but not limited to:  - adverse reactions to medications - damage to eyes, teeth,  lips or other oral mucosa - nerve damage due to positioning  - sore throat or hoarseness - damage to heart, brain, nerves, lungs, other parts of body or loss of life  Informed patient about role of CRNA in peri- and intra-operative care.  Patient voiced understanding.)       Anesthesia Quick Evaluation

## 2023-02-13 NOTE — Transfer of Care (Signed)
Immediate Anesthesia Transfer of Care Note  Patient: Jack Crane  Procedure(s) Performed: CATARACT EXTRACTION PHACO AND INTRAOCULAR LENS PLACEMENT (IOC) RIGHT DIABETIC 15.65 01:23.6 (Right: Eye)  Patient Location: PACU  Anesthesia Type: MAC  Level of Consciousness: awake, alert  and patient cooperative  Airway and Oxygen Therapy: Patient Spontanous Breathing and Patient connected to supplemental oxygen  Post-op Assessment: Post-op Vital signs reviewed, Patient's Cardiovascular Status Stable, Respiratory Function Stable, Patent Airway and No signs of Nausea or vomiting  Post-op Vital Signs: Reviewed and stable  Complications: No notable events documented.

## 2023-02-13 NOTE — H&P (Signed)
[facility]   Primary Care Physician:  Larae Grooms, NP Ophthalmologist: Dr. Lockie Mola  Pre-Procedure History & Physical: HPI:  Jack Crane is a 82 y.o. male here for ophthalmic surgery.   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 Authorizing Provider  aspirin 81 MG tablet Take 81 mg by mouth daily.   Yes [provider]  atorvastatin (LIPITOR) 40 MG tablet Take 1 tablet (40 mg total) by mouth daily. 02/07/23  Yes Larae Grooms, NP  diclofenac Sodium (VOLTAREN) 1 % GEL APPLY 4 G TOPICALLY 4 TIMES DAILY 07/06/22  Yes Larae Grooms, NP  metFORMIN (GLUCOPHAGE) 1000 MG tablet TAKE 1 TABLET (1,000 MG TOTAL) BY MOUTH TWICE A DAY WITH FOOD 02/07/23  Yes Larae Grooms, NP  glucose blood test strip 1 each by Other route as needed for other. Use as instructed    [provider]    Allergies as of 02/01/2023   (No Known Allergies)    Family History   Problem Relation Age of Onset   Diabetes Mother    Cancer Sister        lung   Diabetes Brother    Diabetes Brother    Diabetes Brother     Social History   Socioeconomic History   Marital status: Single    Spouse name: Not on file   Number of children: Not on file   Years of education: Not on file   Highest education level: 9th grade  Occupational History   Not on file  Tobacco Use   Smoking status: Former    Current packs/day: 0.00    Types: Cigarettes    Quit date: 11/02/1974    Years since quitting: 48.3   Smokeless tobacco: Current    Types: Chew  Vaping Use   Vaping status: Never Used  Substance and Sexual Activity   Alcohol use: No    Alcohol/week: 0.0 standard drinks of alcohol   Drug use: No   Sexual activity: Yes    Birth control/protection: None  Other Topics Concern   Not on file  Social History Narrative   Not on file   Social Determinants of Health   Financial Resource Strain: Low Risk  (12/23/2020)   Overall Financial Resource Strain (CARDIA)    Difficulty of Paying Living Expenses: Not hard at all  Food Insecurity: No Food Insecurity (12/23/2020)   Hunger Vital  Sign    Worried About Programme researcher, broadcasting/film/video in the Last Year: Never true    Ran Out of Food in the Last Year: Never true  Transportation Needs: No Transportation Needs (12/23/2020)   PRAPARE - Administrator, Civil Service (Medical): No    Lack of Transportation (Non-Medical): No  Physical Activity: Inactive (12/23/2020)   Exercise Vital Sign    Days of Exercise per Week: 0 days    Minutes of Exercise per Session: 0 min  Stress: No Stress Concern Present (12/23/2020)   [employer] of Occupational Health - Occupational Stress Questionnaire    Feeling of Stress : Not at all  Social Connections: Moderately Isolated (12/06/2017)   Social Connection and Isolation Panel [NHANES]    Frequency of Communication with Friends and Family: Once a week    Frequency of Social Gatherings with  Friends and Family: More than three times a week    Attends Religious Services: Never    Database administrator or Organizations: No    Attends Banker Meetings: Never    Marital Status: Never married  Intimate Partner Violence: Not At Risk (12/06/2017)   Humiliation, Afraid, Rape, and Kick questionnaire    Fear of Current or Ex-Partner: No    Emotionally Abused: No    Physically Abused: No    Sexually Abused: No    Review of Systems: See HPI, otherwise negative ROS  Physical Exam: BP 129/77   Pulse 83   Temp 97.8 F (36.6 C) (Temporal)   Resp 19   Ht 5' 5.98" (1.676 m)   Wt 97.7 kg   SpO2 96%   BMI 34.77 kg/m  General:   Alert,  pleasant and cooperative in NAD Head:  Normocephalic and atraumatic. Lungs:  Clear to auscultation.    Heart:  Regular rate and rhythm.   Impression/Plan: Jack Crane is here for ophthalmic surgery.  Risks, benefits, limitations, and alternatives regarding ophthalmic surgery have been reviewed with the patient.  Questions have been answered.  All parties agreeable.   Lockie Mola, MD  02/13/2023, 9:56 AM

## 2023-02-13 NOTE — Op Note (Signed)
LOCATION:  [facility]   PREOPERATIVE DIAGNOSIS:    Nuclear sclerotic cataract right eye. H25.11   POSTOPERATIVE DIAGNOSIS:  Nuclear sclerotic cataract right eye.     PROCEDURE:  Phacoemusification with posterior chamber intraocular lens placement of the right eye   ULTRASOUND TIME: Procedure(s): CATARACT EXTRACTION PHACO AND INTRAOCULAR LENS PLACEMENT (IOC) RIGHT DIABETIC 15.65 01:23.6 (Right)  LENS:   Implant Name Type Inv. Item Serial No. Manufacturer Lot No. LRB No. Used Action  LENS IOL TECNIS EYHANCE 20.5 - [id number] Intraocular Lens LENS IOL TECNIS EYHANCE 20.5 [id number] [employer]  Right 1 Implanted         SURGEON:  Deirdre Evener, MD   ANESTHESIA:  Topical with tetracaine drops and 2% Xylocaine jelly, augmented with 1% preservative-free intracameral lidocaine.    COMPLICATIONS:  None.   DESCRIPTION OF PROCEDURE:  The patient was identified in the holding room and transported to the operating room and placed in the supine position under the operating microscope.  The right eye was identified as the operative eye and it was prepped and draped in the usual sterile ophthalmic fashion.   A 1 millimeter clear-corneal paracentesis was made at the 12:00 position.  0.5 ml of preservative-free 1% lidocaine was injected into the anterior chamber. The anterior chamber was filled with Viscoat viscoelastic.  A 2.4 millimeter keratome was used to make a near-clear corneal incision at the 9:00 position.  A curvilinear capsulorrhexis was made with a cystotome and capsulorrhexis forceps.  Balanced salt solution was used to hydrodissect and hydrodelineate the nucleus.  A MiLoop device was used to prechop the lens nucleus to facilitate phacoemulsification.  Phacoemulsification was then used in stop and chop fashion to remove the lens nucleus and epinucleus.  The remaining cortex was then removed using the irrigation and aspiration handpiece. Provisc was then placed into the  capsular bag to distend it for lens placement.  A lens was then injected into the capsular bag.  The remaining viscoelastic was aspirated.   Wounds were hydrated with balanced salt solution.  The anterior chamber was inflated to a physiologic pressure with balanced salt solution.  No wound leaks were noted. Cefuroxime 0.1 ml of a 10mg /ml solution was injected into the anterior chamber for a dose of 1 mg of intracameral antibiotic at the completion of the case.   Timolol and Brimonidine drops were applied to the eye.  The patient was taken to the recovery room in stable condition without complications of anesthesia or surgery.   Janai Brannigan 02/13/2023, 10:51 AM

## 2023-02-14 ENCOUNTER — Encounter: Payer: Self-pay | Admitting: Ophthalmology

## 2023-03-05 DIAGNOSIS — Z0100 Encounter for examination of eyes and vision without abnormal findings: Secondary | ICD-10-CM | POA: Diagnosis not present

## 2023-03-05 DIAGNOSIS — Z961 Presence of intraocular lens: Secondary | ICD-10-CM | POA: Diagnosis not present

## 2023-03-18 ENCOUNTER — Other Ambulatory Visit: Payer: Self-pay

## 2023-03-18 DIAGNOSIS — L719 Rosacea, unspecified: Secondary | ICD-10-CM

## 2023-03-18 MED ORDER — METRONIDAZOLE 0.75 % EX GEL: CUTANEOUS | 0 refills | Status: DC

## 2023-03-18 NOTE — Progress Notes (Signed)
Patient's request for Metronidazole refill. aw

## 2023-05-10 ENCOUNTER — Encounter: Payer: Self-pay | Admitting: Nurse Practitioner

## 2023-05-10 ENCOUNTER — Ambulatory Visit: Payer: Medicare PPO | Admitting: Nurse Practitioner

## 2023-05-10 VITALS — BP 137/78 | HR 54 | Temp 97.4°F | Ht 65.98 in | Wt 217.0 lb

## 2023-05-10 DIAGNOSIS — E7800 Pure hypercholesterolemia, unspecified: Secondary | ICD-10-CM

## 2023-05-10 DIAGNOSIS — N181 Chronic kidney disease, stage 1: Secondary | ICD-10-CM

## 2023-05-10 DIAGNOSIS — E1122 Type 2 diabetes mellitus with diabetic chronic kidney disease: Secondary | ICD-10-CM | POA: Diagnosis not present

## 2023-05-10 DIAGNOSIS — Z7985 Long-term (current) use of injectable non-insulin antidiabetic drugs: Secondary | ICD-10-CM

## 2023-05-10 DIAGNOSIS — I129 Hypertensive chronic kidney disease with stage 1 through stage 4 chronic kidney disease, or unspecified chronic kidney disease: Secondary | ICD-10-CM

## 2023-05-10 DIAGNOSIS — E119 Type 2 diabetes mellitus without complications: Secondary | ICD-10-CM | POA: Diagnosis not present

## 2023-05-10 MED ORDER — METFORMIN HCL 1000 MG PO TABS: ORAL_TABLET | ORAL | 1 refills | Status: DC

## 2023-05-10 MED ORDER — ATORVASTATIN CALCIUM 40 MG PO TABS: 40.0000 mg | ORAL_TABLET | Freq: Every day | ORAL | 1 refills | Status: DC

## 2023-05-10 NOTE — Progress Notes (Addendum)
BP 137/78 (BP Location: Left Arm, Patient Position: Sitting, Cuff Size: Large)   Pulse (!) 54   Temp (!) 97.4 F (36.3 C) (Oral)   Ht 5' 5.98" (1.676 m)   Wt 217 lb (98.4 kg)   SpO2 99%   BMI 35.05 kg/m    Subjective:    [name], male    DOB: 1940-08-08, 83 y.o.   MRN: [id number]  HPI: Jack Crane is a 83 y.o. male  Chief Complaint  Patient presents with   Hypertension   Diabetes   3 month follow up   HYPERTENSION / HYPERLIPIDEMIA Doing well, no concerns at visit today.  Satisfied with current treatment? yes Duration of hypertension: years BP monitoring frequency: not checking BP range:  BP medication side effects: no Past BP meds: none Duration of hyperlipidemia: years Cholesterol medication side effects: no Cholesterol supplements: none Past cholesterol medications: atorvastain (lipitor) Medication compliance: excellent compliance Aspirin: yes Recent stressors: no Recurrent headaches: no Visual changes: no Palpitations: no Dyspnea: no Chest pain: no Lower extremity edema: no Dizzy/lightheaded: no  DIABETES Last A1c 7.0% in July. On Metformin 1000mg  BID Hypoglycemic episodes:no Polydipsia/polyuria: no Visual disturbance: no Chest pain: no Paresthesias: no Glucose Monitoring: yes  Accucheck frequency: daily  Fasting glucose: 130-200  Post prandial:  Evening:  Before meals: Taking Insulin?: no  Long acting insulin:  Short acting insulin: Blood Pressure Monitoring: not checking Retinal Examination: Up to Date Foot Exam: Up to Date Diabetic Education: Not Completed Pneumovax: Up to Date Influenza: Up to Date Aspirin: yes   Relevant past medical, surgical, family and social history reviewed and updated as indicated. Interim medical history since our last visit reviewed. Allergies and medications reviewed and updated.  Review of Systems  Eyes:  Negative for visual disturbance.  Respiratory:  Negative for chest tightness  and shortness of breath.   Cardiovascular:  Negative for chest pain, palpitations and leg swelling.  Endocrine: Negative for polydipsia and polyuria.  Neurological:  Negative for dizziness, light-headedness, numbness and headaches.    Per HPI unless specifically indicated above     Objective:    BP 137/78 (BP Location: Left Arm, Patient Position: Sitting, Cuff Size: Large)   Pulse (!) 54   Temp (!) 97.4 F (36.3 C) (Oral)   Ht 5' 5.98" (1.676 m)   Wt 217 lb (98.4 kg)   SpO2 99%   BMI 35.05 kg/m   Wt Readings from Last 3 Encounters:  05/10/23 217 lb (98.4 kg)  02/13/23 215 lb 4.8 oz (97.7 kg)  02/07/23 212 lb 3.2 oz (96.3 kg)    Physical Exam Vitals and nursing note reviewed.  Constitutional:      General: He is not in acute distress.    Appearance: Normal appearance. He is obese. He is not ill-appearing, toxic-appearing or diaphoretic.  HENT:     Head: Normocephalic.     Right Ear: External ear normal.     Left Ear: External ear normal.     Nose: Nose normal. No congestion or rhinorrhea.     Mouth/Throat:     Mouth: Mucous membranes are moist.  Eyes:     General:        Right eye: No discharge.        Left eye: No discharge.     Extraocular Movements: Extraocular movements intact.     Conjunctiva/sclera: Conjunctivae normal.     Pupils: Pupils are equal, round, and reactive to light.  Cardiovascular:  Rate and Rhythm: Normal rate and regular rhythm.     Heart sounds: No murmur heard. Pulmonary:     Effort: Pulmonary effort is normal. No respiratory distress.     Breath sounds: Normal breath sounds. No wheezing, rhonchi or rales.  Abdominal:     General: Abdomen is flat. Bowel sounds are normal.  Musculoskeletal:     Cervical back: Normal range of motion and neck supple.  Skin:    General: Skin is warm and dry.     Capillary Refill: Capillary refill takes less than 2 seconds.  Neurological:     General: No focal deficit present.     Mental Status: He is  alert and oriented to person, place, and time.  Psychiatric:        Mood and Affect: Mood normal.        Behavior: Behavior normal.        Thought Content: Thought content normal.        Judgment: Judgment normal.     Results for orders placed or performed during the hospital encounter of 02/13/23  Glucose, capillary   Collection Time: 02/13/23  9:26 AM  Result Value Ref Range   Glucose-Capillary 92 70 - 99 mg/dL      Assessment & Plan:   Problem List Items Addressed This Visit       Endocrine   Diabetes mellitus treated with injections of non-insulin medication (HCC) - Primary   Chronic, controlled. Last A1c was 7.0%. Sugars are running from 130-200. Recheck A1c -results to guide further management. Will add Rybelsus if A1c >7.5%. Appears to be tolerating Metformin 1000 mg PO BID at this time, denies concerns or side effects today Continue current regimen Follow up in 3 months or sooner if concerns arise       Relevant Medications   atorvastatin (LIPITOR) 40 MG tablet   metFORMIN (GLUCOPHAGE) 1000 MG tablet     Other   Pure hypercholesterolemia   Chronic.  Controlled with Atorvastatin 40mg  daily.  Refills sent today.  Labs ordered today.  Return to clinic in 3 months for reevaluation.  Call sooner if concerns arise.       Relevant Medications   atorvastatin (LIPITOR) 40 MG tablet   Obesity, morbid (HCC)   Recommended eating smaller high protein, low fat meals more frequently and exercising 30 mins a day 5 times a week with a goal of 10-15lb weight loss in the next 3 months.       Relevant Medications   metFORMIN (GLUCOPHAGE) 1000 MG tablet     Follow up plan: Return in about 3 months (around 08/08/2023) for HTN, HLD, DM2 FU.

## 2023-05-10 NOTE — Assessment & Plan Note (Signed)
Chronic.  Controlled with Atorvastatin 40mg  daily.  Refills sent today.  Labs ordered today.  Return to clinic in 3 months for reevaluation.  Call sooner if concerns arise.

## 2023-05-10 NOTE — Assessment & Plan Note (Signed)
Chronic, controlled. Last A1c was 7.0%. Sugars are running from 130-200. Recheck A1c -results to guide further management. Will add Rybelsus if A1c >7.5%. Appears to be tolerating Metformin 1000 mg PO BID at this time, denies concerns or side effects today Continue current regimen Follow up in 3 months or sooner if concerns arise

## 2023-05-10 NOTE — Assessment & Plan Note (Signed)
Recommended eating smaller high protein, low fat meals more frequently and exercising 30 mins a day 5 times a week with a goal of 10-15lb weight loss in the next 3 months.  

## 2023-05-11 LAB — COMPREHENSIVE METABOLIC PANEL
ALT: 21 [IU]/L (ref 0–44)
AST: 25 [IU]/L (ref 0–40)
Albumin: 4.3 g/dL (ref 3.7–4.7)
Alkaline Phosphatase: 86 [IU]/L (ref 44–121)
BUN/Creatinine Ratio: 11 (ref 10–24)
BUN: 12 mg/dL (ref 8–27)
Bilirubin Total: 0.7 mg/dL (ref 0.0–1.2)
CO2: 24 mmol/L (ref 20–29)
Calcium: 9 mg/dL (ref 8.6–10.2)
Chloride: 105 mmol/L (ref 96–106)
Creatinine, Ser: 1.08 mg/dL (ref 0.76–1.27)
Globulin, Total: 2.4 g/dL (ref 1.5–4.5)
Glucose: 122 mg/dL — ABNORMAL HIGH (ref 70–99)
Potassium: 4.2 mmol/L (ref 3.5–5.2)
Sodium: 145 mmol/L — ABNORMAL HIGH (ref 134–144)
Total Protein: 6.7 g/dL (ref 6.0–8.5)
eGFR: 69 mL/min/{1.73_m2} (ref >59)

## 2023-05-11 LAB — HEMOGLOBIN A1C
Est. average glucose Bld gHb Est-mCnc: 143 mg/dL
Hgb A1c MFr Bld: 6.6 % — ABNORMAL HIGH (ref 4.8–5.6)

## 2023-05-13 ENCOUNTER — Encounter: Payer: Self-pay | Admitting: Nurse Practitioner

## 2023-05-14 ENCOUNTER — Ambulatory Visit: Payer: Medicare PPO | Admitting: Emergency Medicine

## 2023-05-14 VITALS — BP 122/78 | Ht 69.0 in | Wt 218.2 lb

## 2023-05-14 DIAGNOSIS — Z0000 Encounter for general adult medical examination without abnormal findings: Secondary | ICD-10-CM | POA: Diagnosis not present

## 2023-05-14 NOTE — Progress Notes (Signed)
Subjective:   Jack Crane is a 83 y.o. male who presents for Medicare Annual/Subsequent preventive examination.  Visit Complete: In person   Cardiac Risk Factors include: advanced age (>17men, >21 women);male gender;diabetes mellitus;hypertension;obesity (BMI >30kg/m2)     Objective:    Today's Vitals   05/14/23 1009  BP: 122/78  Weight: 218 lb 3.2 oz (99 kg)  Height: 5\' 9"  (1.753 m)   Body mass index is 32.22 kg/m.     05/14/2023   10:20 AM 02/13/2023    9:09 AM 01/02/2023    7:04 AM 12/23/2020    1:49 PM 12/21/2019    2:46 PM 12/11/2018    9:41 AM 10/31/2018   12:10 PM  Advanced Directives  Does Patient Have a Medical Advance Directive? Yes Yes Yes Yes Yes Yes No  Type of Estate agent of [place];Living will Living will Healthcare Power of [place];Living will Healthcare Power of [place];Living will Healthcare Power of [place];Living will Living will;Healthcare Power of Attorney   Does patient want to make changes to medical advance directive? No - Patient declined No - Patient declined       Copy of Healthcare Power of Attorney in Chart? Yes - validated most recent copy scanned in chart (See row information)  No - copy requested No - copy requested No - copy requested Yes - validated most recent copy scanned in chart (See row information)   Would patient like information on creating a medical advance directive?       No - Patient declined    Current Medications (verified) Outpatient Encounter Medications as of 05/14/2023  Medication Sig   aspirin 81 MG tablet Take 81 mg by mouth daily.   atorvastatin (LIPITOR) 40 MG tablet Take 1 tablet (40 mg total) by mouth daily.   diclofenac Sodium (VOLTAREN) 1 % GEL APPLY 4 G TOPICALLY 4 TIMES DAILY   glucose blood test strip 1 each by Other route as needed for other. Use as instructed   metFORMIN (GLUCOPHAGE) 1000 MG tablet TAKE 1 TABLET (1,000 MG TOTAL) BY MOUTH TWICE A DAY WITH FOOD   metroNIDAZOLE  (METROGEL) 0.75 % gel Apply to affected areas face 1-2 times daily as needed   No facility-administered encounter medications on file as of 05/14/2023.    Allergies (verified) Patient has no known allergies.   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;  Service: Ophthalmology;  Laterality: Right;   stomach abcess     Family History  Problem Relation Age of Onset   Diabetes Mother    Heart attack Father 13   Cancer Sister        lung   Diabetes Brother    Diabetes Brother    Diabetes Brother    Social History   Socioeconomic History   Marital status: Single    Spouse name: Not on file   Number of children: 0   Years of education: Not on file   Highest education level: 9th grade  Occupational History   Not on file  Tobacco Use   Smoking status: Former    Current  packs/day: 0.00    Average packs/day: 1 pack/day for 16.5 years (16.5 ttl pk-yrs)    Types: Cigarettes    Start date: 43    Quit date: 11/02/1974    Years since quitting: 48.5   Smokeless tobacco: Current    Types: Chew  Vaping Use   Vaping status: Never Used  Substance and Sexual Activity   Alcohol use: No    Alcohol/week: 0.0 standard drinks of alcohol   Drug use: No   Sexual activity: Yes    Birth control/protection: None  Other Topics Concern   Not on file  Social History Narrative   Not on file   Social Drivers of Health   Financial Resource Strain: Low Risk  (05/14/2023)   Overall Financial Resource Strain (CARDIA)    Difficulty of Paying Living Expenses: Not hard at all  Food Insecurity: No Food Insecurity (05/14/2023)   Hunger Vital Sign    Worried About Running Out of Food in the Last Year: Never true    Ran Out of Food in the Last Year: Never true  Transportation Needs: No Transportation Needs (05/14/2023)   PRAPARE - Administrator, Civil Service (Medical): No    Lack of Transportation (Non-Medical): No  Physical Activity: Inactive (05/14/2023)   Exercise Vital Sign    Days of Exercise per Week: 0 days    Minutes of Exercise per Session: 0 min  Stress: No Stress Concern Present (05/14/2023)   [employer] of Occupational Health - Occupational Stress Questionnaire    Feeling of Stress : Not at all  Social Connections: Socially Isolated (05/14/2023)   Social Connection and Isolation Panel [NHANES]    Frequency of Communication with Friends and Family: Twice a week    Frequency of Social Gatherings with Friends and Family: More than three times a week    Attends Religious Services: Never    Database administrator or Organizations: No    Attends Engineer, structural: Never    Marital Status: Never married    Tobacco Counseling Ready to quit: Not Answered Counseling given: Not Answered   Clinical Intake:  Pre-visit preparation  completed: Yes  Pain : No/denies pain     BMI - recorded: 32.22 Nutritional Status: BMI > 30  Obese Nutritional Risks: None Diabetes: Yes CBG done?: No Did pt. bring in CBG monitor from home?: No  How often do you need to have someone help you when you read instructions, pamphlets, or other written materials from your doctor or pharmacy?: 5 - Always What is the last grade level you completed in school?: 8th  Interpreter Needed?: No  Information entered by :: Tora Kindred, CMA   Activities of Daily Living  05/14/2023   10:13 AM 02/13/2023    9:09 AM  In your present state of health, do you have any difficulty performing the following activities:  Hearing? 0 0  Vision? 0 1  Difficulty concentrating or making decisions? 0 0  Walking or climbing stairs? 0   Dressing or bathing? 0   Doing errands, shopping? 0   Preparing Food and eating ? N   Using the Toilet? N   In the past six months, have you accidently leaked urine? N   Do you have problems with loss of bowel control? N   Managing your Medications? N   Managing your Finances? N   Housekeeping or managing your Housekeeping? N     Patient Care Team: Larae Grooms, NP as PCP - General Deirdre Evener, MD (Dermatology) Lockie Mola, MD as Referring Physician (Ophthalmology)  Indicate any recent Medical Services you may have received from other than [facility] providers in the past year (date may be approximate).     Assessment:   This is a routine wellness examination for Jack Crane.  Hearing/Vision screen Hearing Screening - Comments:: No hearing loss Vision Screening - Comments:: Gets eye exams, [facility] [place], [place]   Goals Addressed               This Visit's Progress     DIET - INCREASE WATER INTAKE (pt-stated)        COMPLETED: Quit smoking / using tobacco        Depression Screen    05/14/2023   10:18 AM 02/07/2023   10:26 AM 05/08/2022    2:04 PM 01/18/2022    8:57 AM 10/18/2021     8:59 AM 07/18/2021    8:10 AM 04/19/2021    9:08 AM  PHQ 2/9 Scores  PHQ - 2 Score 0 0 0 0 0 0 0  PHQ- 9 Score  0 0 0 0 1 0    Fall Risk    05/14/2023   10:22 AM 02/07/2023   10:26 AM 05/08/2022    2:04 PM 01/18/2022    8:57 AM 10/18/2021    8:59 AM  Fall Risk   Falls in the past year? 0 0 1 0 0  Number falls in past yr: 0 0 0 0 0  Injury with Fall? 0 0 0 0 0  Risk for fall due to : No Fall Risks No Fall Risks No Fall Risks Other (Comment) No Fall Risks  Follow up Falls prevention discussed Falls evaluation completed Falls evaluation completed Falls evaluation completed Falls evaluation completed    MEDICARE RISK AT HOME: Medicare Risk at Home Any stairs in or around the home?: Yes If so, are there any without handrails?: No Home free of loose throw rugs in walkways, pet beds, electrical cords, etc?: Yes Adequate lighting in your home to reduce risk of falls?: Yes Life alert?: No Use of a cane, walker or w/c?: No Grab bars in the bathroom?: Yes Shower chair or bench in shower?: No Elevated toilet seat or a handicapped toilet?: Yes  TIMED UP AND GO:  Was the test performed?  No    Cognitive Function:        05/14/2023   10:22 AM 12/23/2020    1:52 PM 12/21/2019    2:49 PM 12/11/2018    9:31 AM 12/06/2017    1:47 PM  6CIT Screen  What Year? 0 points 0 points 0 points 0 points 0 points  What month? 0 points 0 points 0  points 0 points 0 points  What time? 0 points 0 points 0 points 0 points 0 points  Count back from 20 0 points 0 points 0 points 0 points 0 points  Months in reverse 4 points 4 points 4 points 0 points 0 points  Repeat phrase 4 points 10 points 10 points 2 points 0 points  Total Score 8 points 14 points 14 points 2 points 0 points    Immunizations Immunization History  Administered Date(s) Administered   Fluad Quad(high Dose 65+) 04/20/2020, 02/15/2021, 01/18/2022   Fluad Trivalent(High Dose 65+) 02/07/2023   Influenza, High Dose Seasonal PF 03/25/2017    Influenza,inj,Quad PF,6+ Mos 06/07/2016   Influenza,inj,quad, With Preservative 02/21/2018, 01/16/2019   PFIZER(Purple Top)SARS-COV-2 Vaccination 05/10/2020, 05/31/2020, 11/29/2020   Pneumococcal Conjugate-13 10/26/2013   Pneumococcal Polysaccharide-23 01/16/2007   Td 02/13/2007, 06/11/2017   Zoster, Live 06/16/2009    TDAP status: Up to date  Flu Vaccine status: Up to date  Pneumococcal vaccine status: Up to date  Covid-19 vaccine status: Declined, Education has been provided regarding the importance of this vaccine but patient still declined. Advised may receive this vaccine at local pharmacy or Health Dept.or vaccine clinic. Aware to provide a copy of the vaccination record if obtained from local pharmacy or Health Dept. Verbalized acceptance and understanding.  Qualifies for Shingles Vaccine? Yes   Zostavax completed Yes   Shingrix Completed?: No.    Education has been provided regarding the importance of this vaccine. Patient has been advised to call insurance company to determine out of pocket expense if they have not yet received this vaccine. Advised may also receive vaccine at local pharmacy or Health Dept. Verbalized acceptance and understanding.  Screening Tests Health Maintenance  Topic Date Due   Zoster Vaccines- Shingrix (1 of 2) 02[birth year]10[birth year]1960   COVID-19 Vaccine (4 - 2024-25 season) 05/26/2023 (Originally 12/23/2022)   Diabetic kidney evaluation - Urine ACR  08/07/2023   HEMOGLOBIN A1C  11/07/2023   OPHTHALMOLOGY EXAM  12/11/2023   Diabetic kidney evaluation - eGFR measurement  05/09/2024   FOOT EXAM  05/09/2024   Medicare Annual Wellness (AWV)  05/13/2024   DTaP/Tdap/Td (3 - Tdap) 06/12/2027   Pneumonia Vaccine 59+ Years old  Completed   INFLUENZA VACCINE  Completed   HPV VACCINES  Aged Out   Hepatitis C Screening  Discontinued    Health Maintenance  Health Maintenance Due  Topic Date Due   Zoster Vaccines- Shingrix (1 of 2) 02[birth year]10/1960    Colorectal cancer  screening: No longer required.   Lung Cancer Screening: (Low Dose CT Chest recommended if Age 78-80 years, 20 pack-year currently smoking OR have quit w/in 15years.) does not qualify.   Lung Cancer Screening Referral: n/a  Additional Screening:  Hepatitis C Screening: does not qualify; Completed 10/14/19  Vision Screening: Recommended annual ophthalmology exams for early detection of glaucoma and other disorders of the eye. Is the patient up to date with their annual eye exam?  Yes  Who is the provider or what is the name of the office in which the patient attends annual eye exams? [facility] [place] If pt is not established with a provider, would they like to be referred to a provider to establish care? No .   Dental Screening: Recommended annual dental exams for proper oral hygiene  Diabetic Foot Exam: Diabetic Foot Exam: Completed 05/10/23  Community Resource Referral / Chronic Care Management: CRR required this visit?  No   CCM required this visit?  No  Plan:     I have personally reviewed and noted the following in the patient's chart:   Medical and social history Use of alcohol, tobacco or illicit drugs  Current medications and supplements including opioid prescriptions. Patient is not currently taking opioid prescriptions. Functional ability and status Nutritional status Physical activity Advanced directives List of other physicians Hospitalizations, surgeries, and ER visits in previous 12 months Vitals Screenings to include cognitive, depression, and falls Referrals and appointments  In addition, I have reviewed and discussed with patient certain preventive protocols, quality metrics, and best practice recommendations. A written personalized care plan for preventive services as well as general preventive health recommendations were provided to patient.     Tora Kindred, CMA   05/14/2023   After Visit Summary: (In Person-Printed) AVS printed and given  to the patient  Nurse Notes:  6 CIT Score - 8 Declined DM & Nutrition education Declined covid vaccine

## 2023-05-14 NOTE — Patient Instructions (Addendum)
Jack Crane , Thank you for taking time to come for your Medicare Wellness Visit. I appreciate your ongoing commitment to your health goals. Please review the following plan we discussed and let me know if I can assist you in the future.   Referrals/Orders/Follow-Ups/Clinician Recommendations: Discuss the need for the Shingrix (shingles vaccine) with Larae Grooms, NP  This is a list of the screening recommended for you and due dates:  Health Maintenance  Topic Date Due   Zoster (Shingles) Vaccine (1 of 2) 02/10/1960   COVID-19 Vaccine (4 - 2024-25 season) 05/26/2023*   Yearly kidney health urinalysis for diabetes  08/07/2023   Hemoglobin A1C  11/07/2023   Eye exam for diabetics  12/11/2023   Yearly kidney function blood test for diabetes  05/09/2024   Complete foot exam   05/09/2024   Medicare Annual Wellness Visit  05/13/2024   DTaP/Tdap/Td vaccine (3 - Tdap) 06/12/2027   Pneumonia Vaccine  Completed   Flu Shot  Completed   HPV Vaccine  Aged Out   Hepatitis C Screening  Discontinued  *Topic was postponed. The date shown is not the original due date.    Advanced directives: (In Chart) A copy of your advanced directives are scanned into your chart should your provider ever need it.  Next Medicare Annual Wellness Visit scheduled for next year: Yes, 05/19/24 @ 10:00 am (in person visit)

## 2023-05-15 DIAGNOSIS — H6123 Impacted cerumen, bilateral: Secondary | ICD-10-CM | POA: Diagnosis not present

## 2023-05-15 DIAGNOSIS — H903 Sensorineural hearing loss, bilateral: Secondary | ICD-10-CM | POA: Diagnosis not present

## 2023-07-03 IMAGING — DX DG KNEE COMPLETE 4+V*R*
4 series · 4 of 4 positions shown · non-contrast
Comparison: None.

CLINICAL DATA: Acute pain of both knees.

Bilateral knee pain.
EXAM:
RIGHT KNEE - COMPLETE 4+ VIEW

[knee ap]
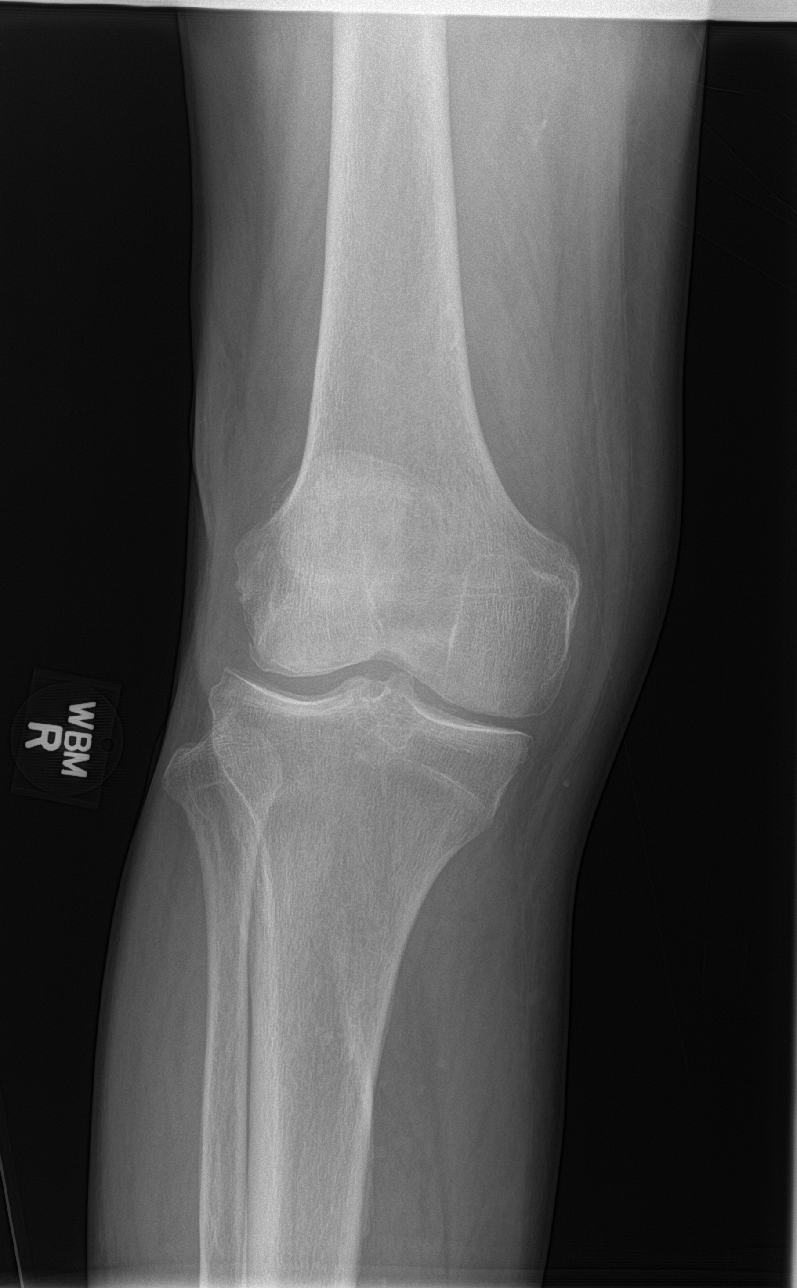

[knee lat]
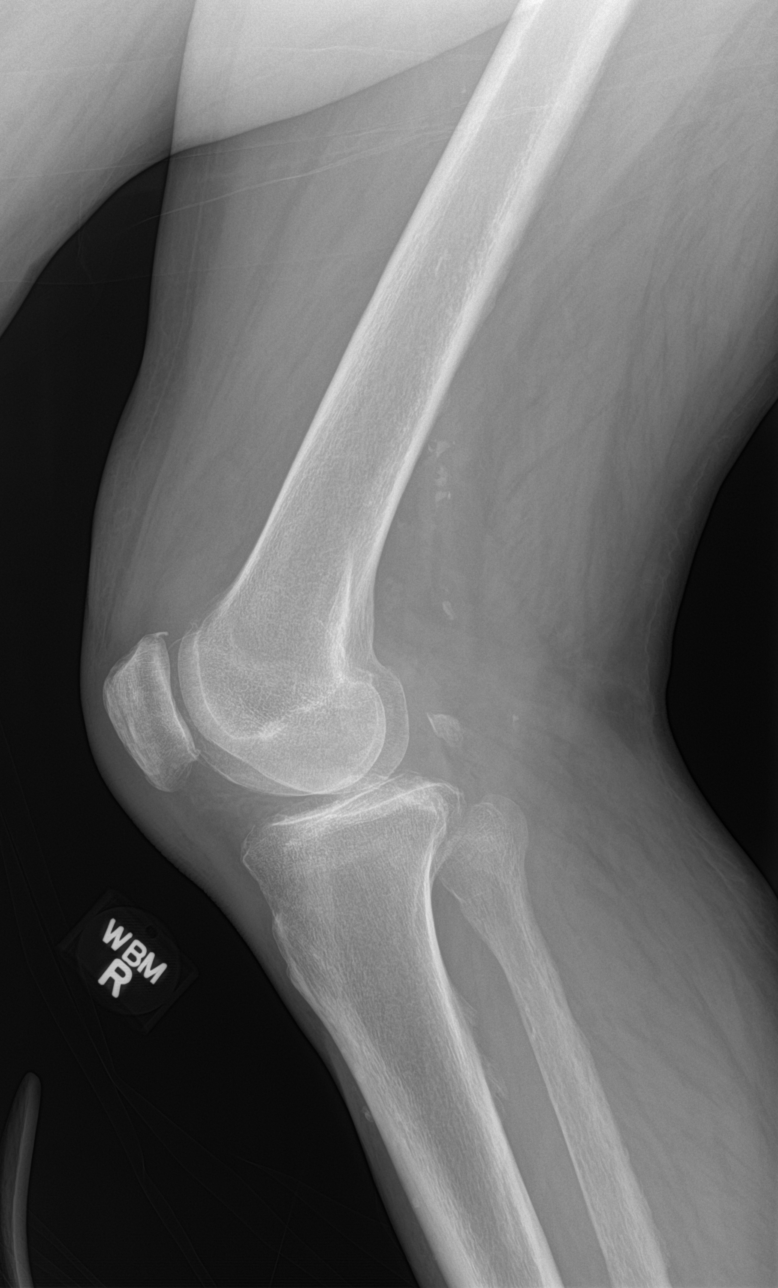

[knee obl (1 of 2)]
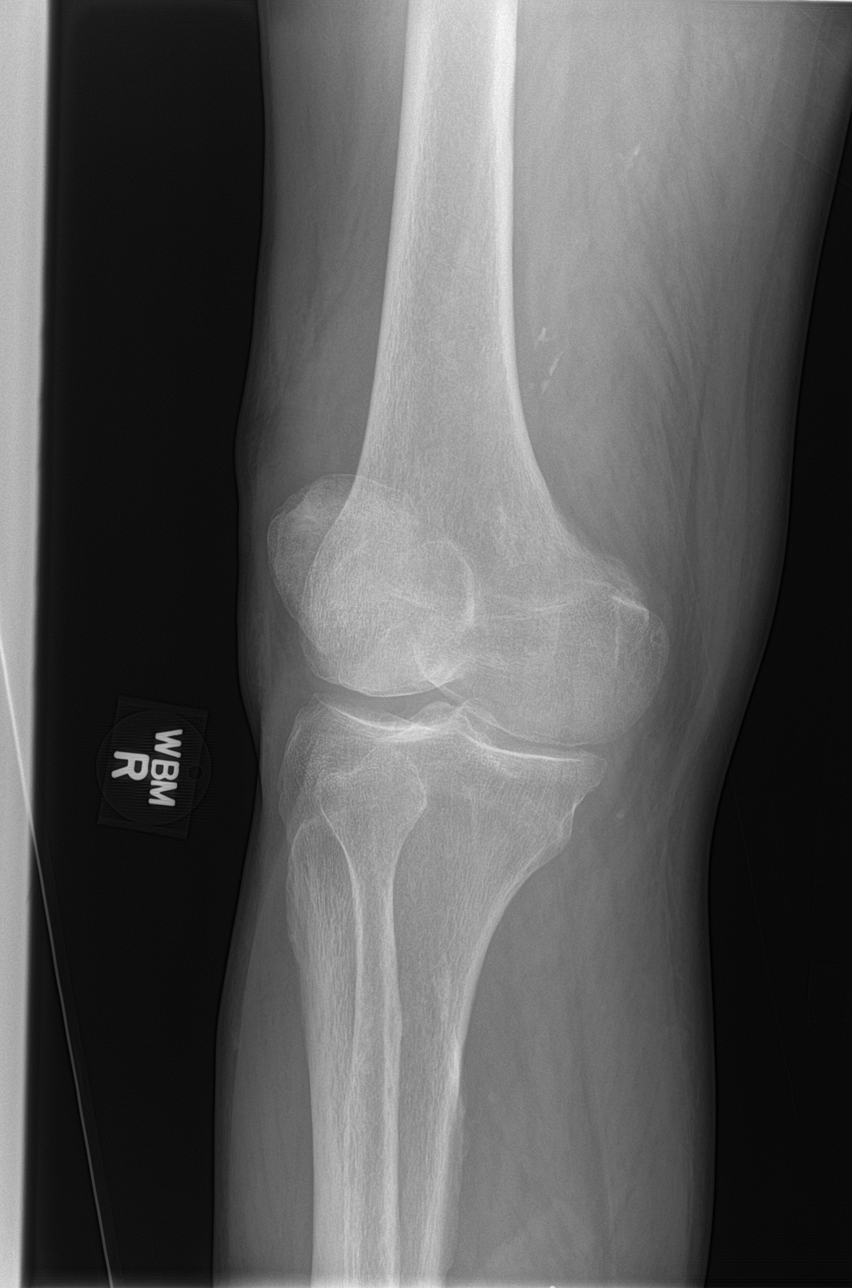

[knee obl (2 of 2)]
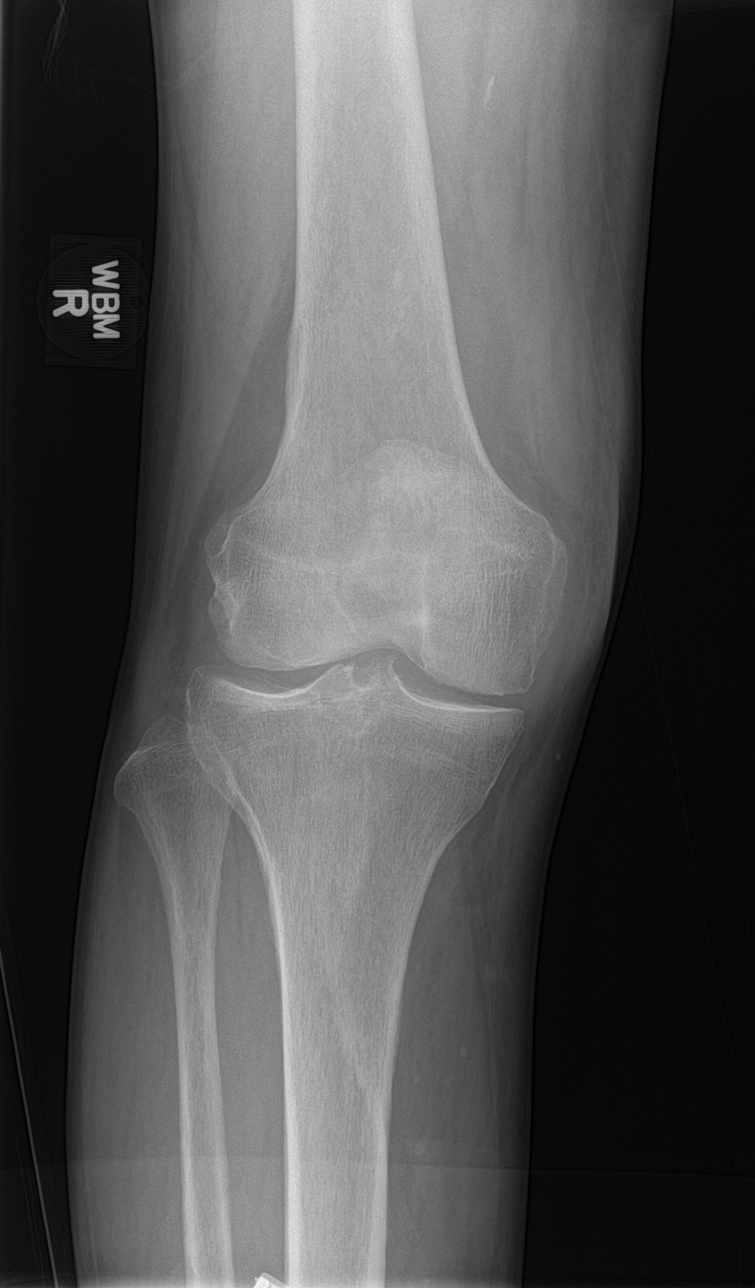

[4 of 4 positions shown; findings below may reference images not displayed]

FINDINGS: Mild medial tibiofemoral joint space narrowing. Mild to moderate
tricompartmental peripheral spurring as well as spurring of the
tibial spines. There is a small to moderate knee joint effusion. No
fracture, erosion, periosteal reaction or focal bone abnormality.
Peripheral vascular calcifications are seen. There is mild
prepatellar soft tissue edema.
IMPRESSION: 1. Mild to moderate tricompartmental osteoarthritis with knee joint
effusion.
2. No acute bony abnormality.
3. Peripheral vascular calcifications.
4. Mild prepatellar soft tissue edema.

## 2023-07-08 ENCOUNTER — Other Ambulatory Visit: Payer: Self-pay | Admitting: Nurse Practitioner

## 2023-07-09 NOTE — Telephone Encounter (Signed)
 Requested Prescriptions  Pending Prescriptions Disp Refills   diclofenac Sodium (VOLTAREN) 1 % GEL [Pharmacy Med Name: DICLOFENAC SODIUM 1% GEL] 100 g 5    Sig: APPLY 4 G TOPICALLY 4 TIMES A DAY     Analgesics:  Topicals Failed - 07/09/2023  5:59 PM      Failed - Manual Review: Labs are only required if the patient has taken medication for more than 8 weeks.      Failed - PLT in normal range and within 360 days    Platelets  Date Value Ref Range Status  05/08/2022 218 150 - 450 x10E3/uL Final         Failed - HGB in normal range and within 360 days    Hemoglobin  Date Value Ref Range Status  05/08/2022 13.5 13.0 - 17.7 g/dL Final         Failed - HCT in normal range and within 360 days    Hematocrit  Date Value Ref Range Status  05/08/2022 40.8 37.5 - 51.0 % Final         Passed - Cr in normal range and within 360 days    Creatinine, Ser  Date Value Ref Range Status  05/10/2023 1.08 0.76 - 1.27 mg/dL Final         Passed - eGFR is 30 or above and within 360 days    GFR calc Af Amer  Date Value Ref Range Status  04/20/2020 66 >59 mL/min/1.73 Final    Comment:    **In accordance with recommendations from the NKF-ASN Task force,**   [facility] is in the process of updating its eGFR calculation to the   2021 CKD-EPI creatinine equation that estimates kidney function   without a race variable.    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 Larae Grooms, NP       Future Appointments             In 1 month Larae Grooms, NP  [facility]   In 2 months Deirdre Evener, MD [facility]

## 2023-08-14 ENCOUNTER — Ambulatory Visit: Payer: Self-pay | Admitting: Nurse Practitioner

## 2023-08-14 ENCOUNTER — Encounter: Payer: Self-pay | Admitting: Nurse Practitioner

## 2023-08-14 VITALS — BP 126/77 | HR 55 | Temp 97.3°F | Wt 217.4 lb

## 2023-08-14 DIAGNOSIS — Z7985 Long-term (current) use of injectable non-insulin antidiabetic drugs: Secondary | ICD-10-CM

## 2023-08-14 DIAGNOSIS — E119 Type 2 diabetes mellitus without complications: Secondary | ICD-10-CM | POA: Diagnosis not present

## 2023-08-14 DIAGNOSIS — E1159 Type 2 diabetes mellitus with other circulatory complications: Secondary | ICD-10-CM

## 2023-08-14 DIAGNOSIS — I152 Hypertension secondary to endocrine disorders: Secondary | ICD-10-CM

## 2023-08-14 DIAGNOSIS — E7800 Pure hypercholesterolemia, unspecified: Secondary | ICD-10-CM

## 2023-08-14 DIAGNOSIS — D692 Other nonthrombocytopenic purpura: Secondary | ICD-10-CM | POA: Diagnosis not present

## 2023-08-14 NOTE — Assessment & Plan Note (Signed)
Chronic.  Controlled with Atorvastatin 40mg  daily.  Refills sent today.  Labs ordered today.  Return to clinic in 3 months for reevaluation.  Call sooner if concerns arise.

## 2023-08-14 NOTE — Assessment & Plan Note (Signed)
Reassured patient.  Does have a skin tear on left arm.

## 2023-08-14 NOTE — Progress Notes (Signed)
 BP 126/77   Pulse (!) 55   Temp (!) 97.3 F (36.3 C) (Oral)   Wt 217 lb 6.4 oz (98.6 kg)   SpO2 94%   BMI 32.10 kg/m    Subjective:    [name], male    DOB: 02-23-1941, 83 y.o.   MRN: [id number]  HPI: Jack Crane is a 83 y.o. male  Chief Complaint  Patient presents with   Hypertension   HYPERTENSION / HYPERLIPIDEMIA Doing well, no concerns at visit today.  Satisfied with current treatment? yes Duration of hypertension: years BP monitoring frequency: not checking BP range:  BP medication side effects: no Past BP meds: none Duration of hyperlipidemia: years Cholesterol medication side effects: no Cholesterol supplements: none Past cholesterol medications: atorvastain (lipitor) Medication compliance: excellent compliance Aspirin: yes Recent stressors: no Recurrent headaches: no Visual changes: no Palpitations: no Dyspnea: no Chest pain: no Lower extremity edema: no Dizzy/lightheaded: no  DIABETES Last A1c 6.6% in Jan. On Metformin  1000mg  BID Hypoglycemic episodes:yes - but eats and it improves.  Very rare. Polydipsia/polyuria: no Visual disturbance: no Chest pain: no Paresthesias: no Glucose Monitoring: yes  Accucheck frequency: daily  Fasting glucose: 140-200 (after eating)  Post prandial:  Evening:  Before meals: Taking Insulin?: no  Long acting insulin:  Short acting insulin: Blood Pressure Monitoring: not checking Retinal Examination: Up to Date Foot Exam: Up to Date Diabetic Education: Not Completed Pneumovax: Up to Date Influenza: Up to Date Aspirin: yes   Relevant past medical, surgical, family and social history reviewed and updated as indicated. Interim medical history since our last visit reviewed. Allergies and medications reviewed and updated.  Review of Systems  Eyes:  Negative for visual disturbance.  Respiratory:  Negative for chest tightness and shortness of breath.   Cardiovascular:  Negative for chest  pain, palpitations and leg swelling.  Endocrine: Negative for polydipsia and polyuria.  Neurological:  Positive for dizziness. Negative for light-headedness, numbness and headaches.    Per HPI unless specifically indicated above     Objective:    BP 126/77   Pulse (!) 55   Temp (!) 97.3 F (36.3 C) (Oral)   Wt 217 lb 6.4 oz (98.6 kg)   SpO2 94%   BMI 32.10 kg/m   Wt Readings from Last 3 Encounters:  08/14/23 217 lb 6.4 oz (98.6 kg)  05/14/23 218 lb 3.2 oz (99 kg)  05/10/23 217 lb (98.4 kg)    Physical Exam Vitals and nursing note reviewed.  Constitutional:      General: He is not in acute distress.    Appearance: Normal appearance. He is obese. He is not ill-appearing, toxic-appearing or diaphoretic.  HENT:     Head: Normocephalic.     Right Ear: External ear normal.     Left Ear: External ear normal.     Nose: Nose normal. No congestion or rhinorrhea.     Mouth/Throat:     Mouth: Mucous membranes are moist.  Eyes:     General:        Right eye: No discharge.        Left eye: No discharge.     Extraocular Movements: Extraocular movements intact.     Conjunctiva/sclera: Conjunctivae normal.     Pupils: Pupils are equal, round, and reactive to light.  Cardiovascular:     Rate and Rhythm: Normal rate and regular rhythm.     Heart sounds: No murmur heard. Pulmonary:     Effort: Pulmonary effort  is normal. No respiratory distress.     Breath sounds: Normal breath sounds. No wheezing, rhonchi or rales.  Abdominal:     General: Abdomen is flat. Bowel sounds are normal.  Musculoskeletal:     Cervical back: Normal range of motion and neck supple.  Skin:    General: Skin is warm and dry.     Capillary Refill: Capillary refill takes less than 2 seconds.  Neurological:     General: No focal deficit present.     Mental Status: He is alert and oriented to person, place, and time.  Psychiatric:        Mood and Affect: Mood normal.        Behavior: Behavior normal.         Thought Content: Thought content normal.        Judgment: Judgment normal.     Results for orders placed or performed in visit on 05/10/23  Comp Met (CMET)   Collection Time: 05/10/23  9:12 AM  Result Value Ref Range   Glucose 122 (H) 70 - 99 mg/dL   BUN 12 8 - 27 mg/dL   Creatinine, Ser 6.04 0.76 - 1.27 mg/dL   eGFR 69 >54 UJ/WJX/9.14   BUN/Creatinine Ratio 11 10 - 24   Sodium 145 (H) 134 - 144 mmol/L   Potassium 4.2 3.5 - 5.2 mmol/L   Chloride 105 96 - 106 mmol/L   CO2 24 20 - 29 mmol/L   Calcium  9.0 8.6 - 10.2 mg/dL   Total Protein 6.7 6.0 - 8.5 g/dL   Albumin 4.3 3.7 - 4.7 g/dL   Globulin, Total 2.4 1.5 - 4.5 g/dL   Bilirubin Total 0.7 0.0 - 1.2 mg/dL   Alkaline Phosphatase 86 44 - 121 IU/L   AST 25 0 - 40 IU/L   ALT 21 0 - 44 IU/L  HgB A1c   Collection Time: 05/10/23  9:12 AM  Result Value Ref Range   Hgb A1c MFr Bld 6.6 (H) 4.8 - 5.6 %   Est. average glucose Bld gHb Est-mCnc 143 mg/dL      Assessment & Plan:   Problem List Items Addressed This Visit       Cardiovascular and Mediastinum   Hypertension associated with diabetes (HCC) - Primary   Chronic.  Controlled without medication.  Would benefit from ACE or ARB for kidney protection, however, patient has had hypotension in the past. Will continue to monitor for elevated blood pressures in the future.  Patient reports recent episodes of dizziness.  Encouraged patient to increase water intake- states he is still not taking in much water..  Labs ordered today.  Return to clinic in 3 months for reevaluation.  Call sooner if concerns arise.       [name] (HCC)   Reassured patient.  Does have a skin tear on left arm.         Endocrine   Diabetes mellitus treated with injections of non-insulin medication (HCC)   Relevant Orders   Comp Met (CMET)   HgB A1c     Other   Pure hypercholesterolemia   Chronic.  Controlled with Atorvastatin  40mg  daily.  Refills sent today.  Labs ordered today.  Return to  clinic in 3 months for reevaluation.  Call sooner if concerns arise.       Relevant Orders   Lipid Profile   Obesity, morbid (HCC)   Recommended eating smaller high protein, low fat meals more frequently and exercising 30 mins a day  5 times a week with a goal of 10-15lb weight loss in the next 3 months.        Follow up plan: Return in about 3 months (around 11/13/2023) for HTN, HLD, DM2 FU.

## 2023-08-14 NOTE — Assessment & Plan Note (Signed)
 Chronic.  Controlled without medication.  Would benefit from ACE or ARB for kidney protection, however, patient has had hypotension in the past. Will continue to monitor for elevated blood pressures in the future.  Patient reports recent episodes of dizziness.  Encouraged patient to increase water intake- states he is still not taking in much water..  Labs ordered today.  Return to clinic in 3 months for reevaluation.  Call sooner if concerns arise.

## 2023-08-14 NOTE — Assessment & Plan Note (Signed)
 Recommended eating smaller high protein, low fat meals more frequently and exercising 30 mins a day 5 times a week with a goal of 10-15lb weight loss in the next 3 months.

## 2023-08-15 ENCOUNTER — Encounter: Payer: Self-pay | Admitting: Nurse Practitioner

## 2023-08-15 LAB — COMPREHENSIVE METABOLIC PANEL WITH GFR
ALT: 15 IU/L (ref 0–44)
AST: 18 IU/L (ref 0–40)
Albumin: 4 g/dL (ref 3.7–4.7)
Alkaline Phosphatase: 86 IU/L (ref 44–121)
BUN/Creatinine Ratio: 13 (ref 10–24)
BUN: 15 mg/dL (ref 8–27)
Bilirubin Total: 0.6 mg/dL (ref 0.0–1.2)
CO2: 23 mmol/L (ref 20–29)
Calcium: 8.3 mg/dL — ABNORMAL LOW (ref 8.6–10.2)
Chloride: 104 mmol/L (ref 96–106)
Creatinine, Ser: 1.17 mg/dL (ref 0.76–1.27)
Globulin, Total: 2.4 g/dL (ref 1.5–4.5)
Glucose: 110 mg/dL — ABNORMAL HIGH (ref 70–99)
Potassium: 4.3 mmol/L (ref 3.5–5.2)
Sodium: 141 mmol/L (ref 134–144)
Total Protein: 6.4 g/dL (ref 6.0–8.5)
eGFR: 62 mL/min/{1.73_m2} (ref >59)

## 2023-08-15 LAB — HEMOGLOBIN A1C
Est. average glucose Bld gHb Est-mCnc: 154 mg/dL
Hgb A1c MFr Bld: 7 % — ABNORMAL HIGH (ref 4.8–5.6)

## 2023-08-15 LAB — LIPID PANEL
Chol/HDL Ratio: 3.3 ratio (ref 0.0–5.0)
Cholesterol, Total: 126 mg/dL (ref 100–199)
HDL: 38 mg/dL — ABNORMAL LOW (ref >39)
LDL Chol Calc (NIH): 71 mg/dL (ref 0–99)
Triglycerides: 85 mg/dL (ref 0–149)
VLDL Cholesterol Cal: 17 mg/dL (ref 5–40)

## 2023-09-05 LAB — HM DIABETES EYE EXAM

## 2023-09-12 ENCOUNTER — Encounter: Payer: Self-pay | Admitting: Dermatology

## 2023-09-12 ENCOUNTER — Ambulatory Visit: Admitting: Dermatology

## 2023-09-12 DIAGNOSIS — Z7189 Other specified counseling: Secondary | ICD-10-CM | POA: Diagnosis not present

## 2023-09-12 DIAGNOSIS — W908XXA Exposure to other nonionizing radiation, initial encounter: Secondary | ICD-10-CM | POA: Diagnosis not present

## 2023-09-12 DIAGNOSIS — L578 Other skin changes due to chronic exposure to nonionizing radiation: Secondary | ICD-10-CM | POA: Diagnosis not present

## 2023-09-12 DIAGNOSIS — L570 Actinic keratosis: Secondary | ICD-10-CM

## 2023-09-12 DIAGNOSIS — L820 Inflamed seborrheic keratosis: Secondary | ICD-10-CM

## 2023-09-12 NOTE — Progress Notes (Signed)
 Follow-Up Visit   Subjective  Jack Crane is a 83 y.o. male who presents for the following: AK follow up.  The patient has spots, moles and lesions to be evaluated, some may be new or changing and the patient may have concern these could be cancer.  The following portions of the chart were reviewed this encounter and updated as appropriate: medications, allergies, medical history  Review of Systems:  No other skin or systemic complaints except as noted in HPI or Assessment and Plan.  Objective  Well appearing patient in no apparent distress; mood and affect are within normal limits.  A focused examination was performed of the following areas: Fae, arms, hands  Relevant exam findings are noted in the Assessment and Plan.  face, ears, hands, arms (31) Erythematous thin papules/macules with gritty scale.  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 They are chronic with expected duration over 1 year. A portion of actinic keratoses will progress to squamous cell carcinoma of the skin. It is not possible to reliably predict which spots will progress to skin cancer and so treatment is recommended to prevent development of skin cancer.  Recommend daily broad spectrum sunscreen SPF 30+ to sun-exposed areas, reapply every 2 hours as needed.  Recommend staying in the shade or wearing long sleeves, sun glasses (UVA+UVB protection) and wide brim hats (4-inch brim around the entire circumference of the hat). Call for new or changing lesions.  Consider field treatment on follow up Destruction of lesion - face, ears, hands, arms (31) Complexity: simple   Destruction method: cryotherapy   Informed consent: discussed and consent obtained   Timeout:  patient name, date of  birth, surgical site, and procedure verified Lesion destroyed using liquid nitrogen: Yes   Region frozen until ice ball extended beyond lesion: Yes   Outcome: patient tolerated procedure well with no complications   Post-procedure details: wound care instructions given   INFLAMED SEBORRHEIC KERATOSIS (2) face x 2 (2) Symptomatic, irritating, patient would like treated.  Benign-appearing.  Call clinic for new or changing lesions.   Destruction of lesion - face x 2 (2) Complexity: simple   Destruction method: cryotherapy   Informed consent: discussed and consent obtained   Timeout:  patient name, date of birth, surgical site, and procedure verified Lesion destroyed using liquid nitrogen: Yes   Region frozen until ice ball extended beyond lesion: Yes   Outcome: patient tolerated procedure well with no complications   Post-procedure details: wound care instructions given    ACTINIC DAMAGE WITH PRECANCEROUS ACTINIC KERATOSES Counseling for Topical Chemotherapy Management: Patient exhibits: - Severe, confluent actinic changes with pre-cancerous actinic keratoses that is secondary to cumulative UV radiation exposure over time - Condition that is severe; chronic, not at goal. - diffuse scaly erythematous macules and papules with underlying dyspigmentation - Discussed Prescription "Field Treatment" topical Chemotherapy for Severe, Chronic Confluent Actinic Changes with Pre-Cancerous Actinic Keratoses Field treatment involves treatment of an entire area of skin that has confluent Actinic Changes (Sun/ Ultraviolet light damage) and PreCancerous Actinic Keratoses by method of PhotoDynamic Therapy (PDT) and/or prescription Topical Chemotherapy agents such as 5-fluorouracil, 5-fluorouracil/calcipotriene, and/or imiquimod.  The purpose is to decrease the number of clinically evident and subclinical PreCancerous lesions to prevent progression to development of skin cancer by chemically destroying  early  precancer changes that may or may not be visible.  It has been shown to reduce the risk of developing skin cancer in the treated area. As a result of treatment, redness, scaling, crusting, and open sores may occur during treatment course. One or more than one of these methods may be used and may have to be used several times to control, suppress and eliminate the PreCancerous changes. Discussed treatment course, expected reaction, and possible side effects. - Recommend daily broad spectrum sunscreen SPF 30+ to sun-exposed areas, reapply every 2 hours as needed.  - Staying in the shade or wearing long sleeves, sun glasses (UVA+UVB protection) and wide brim hats (4-inch brim around the entire circumference of the hat) are also recommended. - Call for new or changing lesions. May plan topical tx or PDT at next visit.  Discussed w pt.   Return in about 5 months (around 02/12/2024) for AK follow up, with Dr. Almeda Aris, RMA, am acting as scribe for Celine Collard, MD .   Documentation: I have reviewed the above documentation for accuracy and completeness, and I agree with the above.  Celine Collard, MD

## 2023-09-12 NOTE — Patient Instructions (Signed)

## 2023-09-19 ENCOUNTER — Ambulatory Visit: Payer: Medicare PPO | Admitting: Dermatology

## 2023-11-15 ENCOUNTER — Encounter: Payer: Self-pay | Admitting: Nurse Practitioner

## 2023-11-15 ENCOUNTER — Ambulatory Visit: Admitting: Nurse Practitioner

## 2023-11-15 VITALS — BP 107/73 | HR 58 | Temp 97.6°F | Wt 212.0 lb

## 2023-11-15 DIAGNOSIS — E1159 Type 2 diabetes mellitus with other circulatory complications: Secondary | ICD-10-CM

## 2023-11-15 DIAGNOSIS — D692 Other nonthrombocytopenic purpura: Secondary | ICD-10-CM | POA: Diagnosis not present

## 2023-11-15 DIAGNOSIS — E119 Type 2 diabetes mellitus without complications: Secondary | ICD-10-CM | POA: Diagnosis not present

## 2023-11-15 DIAGNOSIS — I152 Hypertension secondary to endocrine disorders: Secondary | ICD-10-CM

## 2023-11-15 DIAGNOSIS — E7800 Pure hypercholesterolemia, unspecified: Secondary | ICD-10-CM | POA: Diagnosis not present

## 2023-11-15 DIAGNOSIS — Z7984 Long term (current) use of oral hypoglycemic drugs: Secondary | ICD-10-CM

## 2023-11-15 LAB — MICROALBUMIN, URINE WAIVED
Creatinine, Urine Waived: 100 mg/dL (ref 10–300)
Microalb, Ur Waived: 80 mg/L — ABNORMAL HIGH (ref 0–19)

## 2023-11-15 MED ORDER — METFORMIN HCL 1000 MG PO TABS: ORAL_TABLET | ORAL | 1 refills | Status: DC

## 2023-11-15 MED ORDER — ATORVASTATIN CALCIUM 40 MG PO TABS: 40.0000 mg | ORAL_TABLET | Freq: Every day | ORAL | 1 refills | Status: DC

## 2023-11-15 NOTE — Progress Notes (Signed)
 BP 107/73   Pulse (!) 58   Temp 97.6 F (36.4 C) (Oral)   Wt 212 lb (96.2 kg)   SpO2 93%   BMI 31.31 kg/m    Subjective:    [name], male    DOB: April 12, 1941, 83 y.o.   MRN: [id number]  HPI: Jack Crane is a 83 y.o. male  Chief Complaint  Patient presents with   Hypertension   HYPERTENSION / HYPERLIPIDEMIA Doing well, no concerns at visit today.  Satisfied with current treatment? yes Duration of hypertension: years BP monitoring frequency: not checking BP range:  BP medication side effects: no Past BP meds: none Duration of hyperlipidemia: years Cholesterol medication side effects: no Cholesterol supplements: none Past cholesterol medications: atorvastain (lipitor) Medication compliance: excellent compliance Aspirin: yes Recent stressors: no Recurrent headaches: no Visual changes: no Palpitations: no Dyspnea: no Chest pain: no Lower extremity edema: no Dizzy/lightheaded: no  DIABETES Last A1c 7.0% in April. On Metformin  1000mg  BID.   Hypoglycemic episodes: no Polydipsia/polyuria: no Visual disturbance: no Chest pain: no Paresthesias: no Glucose Monitoring: yes  Accucheck frequency: daily  Fasting glucose: 160-200 (after eating)  Post prandial:  Evening:  Before meals: Taking Insulin?: no  Long acting insulin:  Short acting insulin: Blood Pressure Monitoring: not checking Retinal Examination: Up to Date Foot Exam: Up to Date Diabetic Education: Not Completed Pneumovax: Up to Date Influenza: Up to Date Aspirin: yes   Relevant past medical, surgical, family and social history reviewed and updated as indicated. Interim medical history since our last visit reviewed. Allergies and medications reviewed and updated.  Review of Systems  Eyes:  Negative for visual disturbance.  Respiratory:  Negative for chest tightness and shortness of breath.   Cardiovascular:  Negative for chest pain, palpitations and leg swelling.   Endocrine: Negative for polydipsia and polyuria.  Neurological:  Negative for dizziness, light-headedness, numbness and headaches.    Per HPI unless specifically indicated above     Objective:    BP 107/73   Pulse (!) 58   Temp 97.6 F (36.4 C) (Oral)   Wt 212 lb (96.2 kg)   SpO2 93%   BMI 31.31 kg/m   Wt Readings from Last 3 Encounters:  11/15/23 212 lb (96.2 kg)  08/14/23 217 lb 6.4 oz (98.6 kg)  05/14/23 218 lb 3.2 oz (99 kg)    Physical Exam Vitals and nursing note reviewed.  Constitutional:      General: He is not in acute distress.    Appearance: Normal appearance. He is obese. He is not ill-appearing, toxic-appearing or diaphoretic.  HENT:     Head: Normocephalic.     Right Ear: External ear normal.     Left Ear: External ear normal.     Nose: Nose normal. No congestion or rhinorrhea.     Mouth/Throat:     Mouth: Mucous membranes are moist.  Eyes:     General:        Right eye: No discharge.        Left eye: No discharge.     Extraocular Movements: Extraocular movements intact.     Conjunctiva/sclera: Conjunctivae normal.     Pupils: Pupils are equal, round, and reactive to light.  Cardiovascular:     Rate and Rhythm: Normal rate and regular rhythm.     Heart sounds: No murmur heard. Pulmonary:     Effort: Pulmonary effort is normal. No respiratory distress.     Breath sounds: Normal breath sounds.  No wheezing, rhonchi or rales.  Abdominal:     General: Abdomen is flat. Bowel sounds are normal.  Musculoskeletal:     Cervical back: Normal range of motion and neck supple.  Skin:    General: Skin is warm and dry.     Capillary Refill: Capillary refill takes less than 2 seconds.  Neurological:     General: No focal deficit present.     Mental Status: He is alert and oriented to person, place, and time.  Psychiatric:        Mood and Affect: Mood normal.        Behavior: Behavior normal.        Thought Content: Thought content normal.        Judgment:  Judgment normal.     Results for orders placed or performed in visit on 09/06/23  HM DIABETES EYE EXAM   Collection Time: 09/05/23 11:28 AM  Result Value Ref Range   HM Diabetic Eye Exam No Retinopathy No Retinopathy      Assessment & Plan:   Problem List Items Addressed This Visit       Cardiovascular and Mediastinum   Hypertension associated with diabetes (HCC)   Chronic.  Controlled without medication.  Would benefit from ACE or ARB for kidney protection, however, patient has had hypotension in the past. Will continue to monitor for elevated blood pressures in the future.  Patient reports recent episodes of dizziness.  Discussed increasing water intake.  Labs ordered today.  Return to clinic in 3 months for reevaluation.  Call sooner if concerns arise.       Relevant Medications   atorvastatin  (LIPITOR) 40 MG tablet   metFORMIN  (GLUCOPHAGE ) 1000 MG tablet   Other Relevant Orders   Comprehensive metabolic panel with GFR   [name] (HCC) - Primary   Reassured patient.  No concerns at visit today.       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 Last A1c was 7.0%. Sugars are running from 160-200- after eating. Recheck A1c -results to guide further management. Will add Rybelsus or SGLT2 if A1c >7.5%. Appears to be tolerating Metformin  1000 mg PO BID at this time, denies concerns or side effects today Continue current regimen Follow up in 3 months or sooner if concerns arise       Relevant Medications   atorvastatin  (LIPITOR) 40 MG tablet   metFORMIN  (GLUCOPHAGE ) 1000 MG tablet   Other Relevant Orders   Hemoglobin A1c   Lipid panel   Microalbumin, Urine Waived     Other   Pure hypercholesterolemia   Chronic.  Controlled with Atorvastatin  40mg  daily.  Refills sent today.  Labs ordered today.  Return to clinic in 3 months for reevaluation.  Call sooner if  concerns arise.       Relevant Medications   atorvastatin  (LIPITOR) 40 MG tablet   Other Relevant Orders   Lipid panel     Follow up plan: Return in about 3 months (around 02/15/2024) for HTN, HLD, DM2 FU.

## 2023-11-15 NOTE — Assessment & Plan Note (Signed)
 Chronic.  Controlled without medication.  Would benefit from ACE or ARB for kidney protection, however, patient has had hypotension in the past. Will continue to monitor for elevated blood pressures in the future.  Patient reports recent episodes of dizziness.  Discussed increasing water intake.  Labs ordered today.  Return to clinic in 3 months for reevaluation.  Call sooner if concerns arise.

## 2023-11-15 NOTE — Assessment & Plan Note (Signed)
 Chronic, controlled. Last A1c was 7.0%. Sugars are running from 160-200- after eating. Recheck A1c -results to guide further management. Will add Rybelsus or SGLT2 if A1c >7.5%. Appears to be tolerating Metformin  1000 mg PO BID at this time, denies concerns or side effects today Continue current regimen Follow up in 3 months or sooner if concerns arise

## 2023-11-15 NOTE — Assessment & Plan Note (Signed)
 Reassured patient.  No concerns at visit today.

## 2023-11-15 NOTE — Assessment & Plan Note (Signed)
Chronic.  Controlled with Atorvastatin 40mg  daily.  Refills sent today.  Labs ordered today.  Return to clinic in 3 months for reevaluation.  Call sooner if concerns arise.

## 2023-11-16 LAB — COMPREHENSIVE METABOLIC PANEL WITH GFR
ALT: 12 IU/L (ref 0–44)
AST: 16 IU/L (ref 0–40)
Albumin: 4.3 g/dL (ref 3.7–4.7)
Alkaline Phosphatase: 86 IU/L (ref 44–121)
BUN/Creatinine Ratio: 13 (ref 10–24)
BUN: 16 mg/dL (ref 8–27)
Bilirubin Total: 0.6 mg/dL (ref 0.0–1.2)
CO2: 24 mmol/L (ref 20–29)
Calcium: 9.1 mg/dL (ref 8.6–10.2)
Chloride: 101 mmol/L (ref 96–106)
Creatinine, Ser: 1.22 mg/dL (ref 0.76–1.27)
Globulin, Total: 2.6 g/dL (ref 1.5–4.5)
Glucose: 119 mg/dL — ABNORMAL HIGH (ref 70–99)
Potassium: 4.7 mmol/L (ref 3.5–5.2)
Sodium: 140 mmol/L (ref 134–144)
Total Protein: 6.9 g/dL (ref 6.0–8.5)
eGFR: 59 mL/min/1.73 — ABNORMAL LOW (ref >59)

## 2023-11-16 LAB — LIPID PANEL
Chol/HDL Ratio: 3.2 ratio (ref 0.0–5.0)
Cholesterol, Total: 124 mg/dL (ref 100–199)
HDL: 39 mg/dL — ABNORMAL LOW (ref >39)
LDL Chol Calc (NIH): 69 mg/dL (ref 0–99)
Triglycerides: 80 mg/dL (ref 0–149)
VLDL Cholesterol Cal: 16 mg/dL (ref 5–40)

## 2023-11-16 LAB — HEMOGLOBIN A1C
Est. average glucose Bld gHb Est-mCnc: 140 mg/dL
Hgb A1c MFr Bld: 6.5 % — ABNORMAL HIGH (ref 4.8–5.6)

## 2023-11-18 ENCOUNTER — Ambulatory Visit: Payer: Self-pay | Admitting: Nurse Practitioner

## 2024-01-13 ENCOUNTER — Other Ambulatory Visit: Payer: Self-pay | Admitting: Dermatology

## 2024-01-13 DIAGNOSIS — L719 Rosacea, unspecified: Secondary | ICD-10-CM

## 2024-02-11 ENCOUNTER — Encounter: Payer: Self-pay | Admitting: Dermatology

## 2024-02-11 ENCOUNTER — Ambulatory Visit: Admitting: Dermatology

## 2024-02-11 DIAGNOSIS — L578 Other skin changes due to chronic exposure to nonionizing radiation: Secondary | ICD-10-CM

## 2024-02-11 DIAGNOSIS — L719 Rosacea, unspecified: Secondary | ICD-10-CM | POA: Diagnosis not present

## 2024-02-11 DIAGNOSIS — Z7189 Other specified counseling: Secondary | ICD-10-CM | POA: Diagnosis not present

## 2024-02-11 DIAGNOSIS — L821 Other seborrheic keratosis: Secondary | ICD-10-CM

## 2024-02-11 DIAGNOSIS — W908XXA Exposure to other nonionizing radiation, initial encounter: Secondary | ICD-10-CM | POA: Diagnosis not present

## 2024-02-11 DIAGNOSIS — L820 Inflamed seborrheic keratosis: Secondary | ICD-10-CM

## 2024-02-11 DIAGNOSIS — L570 Actinic keratosis: Secondary | ICD-10-CM

## 2024-02-11 DIAGNOSIS — Z79899 Other long term (current) drug therapy: Secondary | ICD-10-CM | POA: Diagnosis not present

## 2024-02-11 MED ORDER — METRONIDAZOLE 0.75 % EX GEL: CUTANEOUS | 11 refills | Status: AC

## 2024-02-11 NOTE — Progress Notes (Signed)
 Follow-Up Visit   Subjective  Jack Crane is a 83 y.o. male who presents for the following:  5 month ak and isk followup  The following portions of the chart were reviewed this encounter and updated as appropriate: medications, allergies, medical history  Review of Systems:  No other skin or systemic complaints except as noted in HPI or Assessment and Plan.  Objective  Well appearing patient in no apparent distress; mood and affect are within normal limits.  A focused examination was performed of the following areas: Face, hands, arms, ears, scalp  Relevant exam findings are noted in the Assessment and Plan.  scalp, face, ears and hand x 20 (20) Erythematous thin papules/macules with gritty scale.  scalp , face, and arms  x 15 (15) Erythematous stuck-on, waxy papule or plaque  Assessment & Plan   SEBORRHEIC KERATOSIS - Stuck-on, waxy, tan-brown papules and/or plaques  - Benign-appearing - Discussed benign etiology and prognosis. 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 Condition is symptomatic/ bothersome to patient. Not currently at goal. Rosacea is a chronic progressive skin condition usually affecting the face of adults, causing redness and/or acne bumps. It is treatable but not curable. It sometimes affects the eyes (ocular rosacea) as well. It may respond to topical and/or systemic medication and can flare with stress, sun exposure, alcohol, exercise, topical steroids (including hydrocortisone/cortisone 10) and some foods.  Daily application of broad spectrum spf 30+ sunscreen to face is recommended to reduce flares.  Patient denies grittiness of the eyes   Treatment Plan Continue Metronidazole  0.75 % gel - apply qd/bid to aa's for rosacea   ACTINIC KERATOSIS (20) scalp, face, ears and hand x 20 (20) - Will  schedule red light  photodynamic therapy to the face with debridement in December - info given in patient handout ACTINIC DAMAGE WITH PRECANCEROUS ACTINIC KERATOSES Counseling for Topical Chemotherapy Management: Patient exhibits: - Severe, confluent actinic changes with pre-cancerous actinic keratoses that is secondary to cumulative UV radiation exposure over time - Condition that is severe; chronic, not at goal. - diffuse scaly erythematous macules and papules with underlying dyspigmentation - Discussed Prescription Field Treatment topical Chemotherapy for Severe, Chronic Confluent Actinic Changes with Pre-Cancerous Actinic Keratoses Field treatment involves treatment of an entire area of skin that has confluent Actinic Changes (Sun/ Ultraviolet light damage) and PreCancerous Actinic Keratoses by method of PhotoDynamic Therapy (PDT) and/or prescription Topical Chemotherapy agents such as 5-fluorouracil, 5-fluorouracil/calcipotriene, and/or imiquimod.  The purpose is to decrease the number of clinically evident and subclinical PreCancerous lesions to prevent progression to development of skin cancer by chemically destroying early precancer changes that may or may not be visible.  It has been shown to reduce the risk of developing skin cancer in the treated area. As a result of treatment, redness, scaling, crusting, and open sores may occur during treatment course. One or more than one of these methods may be used and may have to be used several times to control, suppress and eliminate the PreCancerous changes. Discussed treatment course, expected reaction, and possible side effects. - Recommend daily broad spectrum sunscreen SPF 30+ to sun-exposed areas, reapply every 2 hours as needed.  - Staying in the shade or wearing long sleeves, sun glasses (UVA+UVB protection) and wide brim hats (4-inch brim around the entire circumference of the hat) are also recommended. -  Call for new or changing  lesions.  Actinic keratoses are precancerous spots that appear secondary to cumulative UV radiation exposure/sun exposure over time. They are chronic with expected duration over 1 year. A portion of actinic keratoses will progress to squamous cell carcinoma of the skin. It is not possible to reliably predict which spots will progress to skin cancer and so treatment is recommended to prevent development of skin cancer.  Recommend daily broad spectrum sunscreen SPF 30+ to sun-exposed areas, reapply every 2 hours as needed.  Recommend staying in the shade or wearing long sleeves, sun glasses (UVA+UVB protection) and wide brim hats (4-inch brim around the entire circumference of the hat). Call for new or changing lesions. Destruction of lesion - scalp, face, ears and hand x 20 (20) Complexity: simple   Destruction method: cryotherapy   Informed consent: discussed and consent obtained   Timeout:  patient name, date of birth, surgical site, and procedure verified Lesion destroyed using liquid nitrogen: Yes   Region frozen until ice ball extended beyond lesion: Yes   Outcome: patient tolerated procedure well with no complications   Post-procedure details: wound care instructions given    ROSACEA   Related Medications metroNIDAZOLE  (METROGEL ) 0.75 % gel Apply to affected areas face 1-2 times daily as needed for rosacea INFLAMED SEBORRHEIC KERATOSIS (15) scalp , face, and arms  x 15 (15) Symptomatic, irritating, patient would like treated. Destruction of lesion - scalp , face, and arms  x 15 (15) Complexity: simple   Destruction method: cryotherapy   Informed consent: discussed and consent obtained   Timeout:  patient name, date of birth, surgical site, and procedure verified Lesion destroyed using liquid nitrogen: Yes   Region frozen until ice ball extended beyond lesion: Yes   Outcome: patient tolerated procedure well with no complications   Post-procedure details: wound care instructions  given     Return for 8 month tbse hx of rosacea .  IEleanor Blush, CMA, am acting as scribe for Alm Rhyme, MD.   Documentation: I have reviewed the above documentation for accuracy and completeness, and I agree with the above.  Alm Rhyme, MD

## 2024-02-11 NOTE — Patient Instructions (Addendum)
 Photodynamic Therapy- Blue or Red Light Therapy  Actinic keratoses are the dry, red scaly spots on the skin caused by sun damage. A portion of these spots can turn into skin cancer with time, and treating them can help prevent development of skin cancer.   Treatment of these spots requires removal of the defective skin cells. There are various ways to remove actinic keratoses, including freezing with liquid nitrogen, treatment with creams, or treatment with a blue light procedure in the office.   Photodynamic Therapy (PDT), also known as blue or red light therapy is an in office procedure used to treat actinic keratoses. It works by targeting precancerous cells. After treatment, these cells peel off and are replaced by healthy ones.   For your phototherapy appointment, you will have two appointments on the day of your treatment. The first appointment will be to apply a cream to the treatment area. You will leave this cream on for 1-2 hours depending on the area being treated. The second appointment will be to shine a blue or red light on the area for 16-20 minutes to kill off the precancer cells. It is common to experience a burning sensation during the treatment.  After your treatment, it will be important to keep the treated areas of skin out of the sun completely for 48-72 hours (2-3 days) to prevent having a reaction.   Common side effects include: - Burning or stinging, which may be severe and can last up to 24-72 hours after your treatment - Scaling and crusting which may last up to 2 weeks - Redness, swelling and/or peeling which can last up to 4 weeks  To Care for Your Skin After PDT/Blue/Red Light Therapy: - Wash with soap, water and shampoo as normal. - If needed, you can use cold compresses (e.g. ice packs) for comfort - If okay with your primary care doctor, you may use analgesics such as acetaminophen (tylenol) every 4-6 hours, not to exceed recommended dose - You may apply  Cerave Healing Ointment, Vaseline or Aquaphor as needed - If you have a lot of swelling you may take a Benadryl to help with this (this may cause drowsiness), not to exceed recommended dose. This may increase the risk of falls in people over 65 and may slow reaction time while driving, so it is not recommended to take before driving or operating machinery. - Sun Precautions - Wear a wide brim hat for the next week if outside  - Wear a sunblock with zinc or titanium dioxide at least SPF 50 daily  If you have any questions or concerns, please call the office and ask to speak with a nurse.   --------------------------------------------------------------------------------------------------------------    Actinic keratoses are precancerous spots that appear secondary to cumulative UV radiation exposure/sun exposure over time. They are chronic with expected duration over 1 year. A portion of actinic keratoses will progress to squamous cell carcinoma of the skin. It is not possible to reliably predict which spots will progress to skin cancer and so treatment is recommended to prevent development of skin cancer.  Recommend daily broad spectrum sunscreen SPF 30+ to sun-exposed areas, reapply every 2 hours as needed.  Recommend staying in the shade or wearing long sleeves, sun glasses (UVA+UVB protection) and wide brim hats (4-inch brim around the entire circumference of the hat). Call for new or changing lesions.   Cryotherapy Aftercare  Wash gently with soap and water everyday.   Apply Vaseline and Band-Aid daily until healed.  Due to recent changes in healthcare laws, you may see results of your pathology and/or laboratory studies on MyChart before the doctors have had a chance to review them. We understand that in some cases there may be results that are confusing or concerning to you. Please understand that not all results are received at the same time and often the doctors may need to interpret  multiple results in order to provide you with the best plan of care or course of treatment. Therefore, we ask that you please give us  2 business days to thoroughly review all your results before contacting the office for clarification. Should we see a critical lab result, you will be contacted sooner.   If You Need Anything After Your Visit  If you have any questions or concerns for your doctor, please call our main line at [phone] and press option 4 to reach your doctor's medical assistant. If no one answers, please leave a voicemail as directed and we will return your call as soon as possible. Messages left after 4 pm will be answered the following business day.   You may also send us  a message via MyChart. We typically respond to MyChart messages within 1-2 business days.  For prescription refills, please ask your pharmacy to contact our office. Our fax number is [phone].  If you have an urgent issue when the clinic is closed that cannot wait until the next business day, you can page your doctor at the number below.    Please note that while we do our best to be available for urgent issues outside of office hours, we are not available 24/7.   If you have an urgent issue and are unable to reach us , you may choose to seek medical care at your doctor's office, retail clinic, urgent care center, or emergency room.  If you have a medical emergency, please immediately call 911 or go to the emergency department.  Pager Numbers  - Dr. Hester: [phone]  - Dr. Jackquline: [phone]  - Dr. Claudene: [phone]   - Dr. Raymund: [phone]  In the event of inclement weather, please call our main line at [phone] for an update on the status of any delays or closures.  Dermatology Medication Tips: Please keep the boxes that topical medications come in in order to help keep track of the instructions about where and how to use these. Pharmacies typically print the medication  instructions only on the boxes and not directly on the medication tubes.   If your medication is too expensive, please contact our office at [phone] option 4 or send us  a message through MyChart.   We are unable to tell what your co-pay for medications will be in advance as this is different depending on your insurance coverage. However, we may be able to find a substitute medication at lower cost or fill out paperwork to get insurance to cover a needed medication.   If a prior authorization is required to get your medication covered by your insurance company, please allow us  1-2 business days to complete this process.  Drug prices often vary depending on where the prescription is filled and some pharmacies may offer cheaper prices.  The website www.goodrx.com contains coupons for medications through different pharmacies. The prices here do not account for what the cost may be with help from insurance (it may be cheaper with your insurance), but the website can give you the price if you did not use any insurance.  - You can print  the associated coupon and take it with your prescription to the pharmacy.  - You may also stop by our office during regular business hours and pick up a GoodRx coupon card.  - If you need your prescription sent electronically to a different pharmacy, notify our office through [facility] or by phone at [phone] option 4.     Si Usted Necesita Algo Despus de Su Visita  Tambin puede enviarnos un mensaje a travs de Clinical cytogeneticist. Por lo general respondemos a los mensajes de MyChart en el transcurso de 1 a 2 das hbiles.  Para renovar recetas, por favor pida a su farmacia que se ponga en contacto con nuestra oficina. Randi lakes de fax es [place] [phone].  Si tiene un asunto urgente cuando la clnica est cerrada y que no puede esperar hasta el siguiente da hbil, puede llamar/localizar a su doctor(a) al nmero que aparece a continuacin.   Por  favor, tenga en cuenta que aunque hacemos todo lo posible para estar disponibles para asuntos urgentes fuera del horario de [place], no estamos disponibles las 24 horas del da, los 7 [address]  [address] de la [place].   Si tiene un problema urgente y no puede comunicarse con nosotros, puede optar por buscar atencin mdica  en el consultorio de su doctor(a), en una clnica privada, en un centro de atencin urgente o en una sala de emergencias.  Si tiene Engineer, drilling, por favor llame inmediatamente al 911 o vaya a la sala de emergencias.  Nmeros de bper  - Dr. Hester: [phone]  - Dra. Jackquline: [phone]  - Dr. Claudene: [phone]  - Dra. Kitts: [phone]  En caso de inclemencias del [place], por favor llame a nuestra lnea principal al [phone] para una actualizacin sobre el estado de cualquier retraso o cierre.  Consejos para la medicacin en dermatologa: Por favor, guarde las cajas en las que vienen los medicamentos de uso tpico para ayudarle a seguir las instrucciones sobre dnde y cmo usarlos. Las farmacias generalmente imprimen las instrucciones del medicamento slo en las cajas y no directamente en los tubos del [place].   Si su medicamento es muy caro, por favor, pngase en contacto con landry rieger llamando al [phone] y presione la opcin 4 o envenos un mensaje a travs de Clinical cytogeneticist.   No podemos decirle cul ser su copago por los medicamentos por adelantado ya que esto es diferente dependiendo de la cobertura de su seguro. Sin embargo, es posible que podamos encontrar un medicamento sustituto a Audiological scientist un formulario para que el seguro cubra el medicamento que se considera necesario.   Si se requiere una autorizacin previa para que su compaa de seguros malta su medicamento, por favor permtanos de 1 a 2 das hbiles para completar este proceso.  Los precios de los medicamentos varan con frecuencia dependiendo del Environmental consultant de dnde se surte la  receta y alguna farmacias pueden ofrecer precios ms baratos.  El sitio web www.goodrx.com tiene cupones para medicamentos de Health and safety inspector. Los precios aqu no tienen en cuenta lo que podra costar con la ayuda del seguro (puede ser ms barato con su seguro), pero el sitio web puede darle el precio si no utiliz Tourist information centre manager.  - Puede imprimir el cupn correspondiente y llevarlo con su receta a la farmacia.  - Tambin puede pasar por nuestra oficina durante el horario de atencin regular y Education officer, museum una tarjeta de cupones de GoodRx.  - Si necesita que su receta se enve electrnicamente a ignacia bender  diferente, informe a nuestra oficina a travs de MyChart de [place] o por telfono llamando al [phone] y presione la opcin 4.

## 2024-02-14 ENCOUNTER — Telehealth: Payer: Self-pay | Admitting: Nurse Practitioner

## 2024-02-14 NOTE — Telephone Encounter (Signed)
 Channing, do you know anything about this letter that can be written?

## 2024-02-17 NOTE — Telephone Encounter (Signed)
 Please see Cheryl's message below.  [facility] is no longer going to provide Ozempic to patient's with Medicare.

## 2024-02-18 ENCOUNTER — Ambulatory Visit (INDEPENDENT_AMBULATORY_CARE_PROVIDER_SITE_OTHER): Admitting: Nurse Practitioner

## 2024-02-18 ENCOUNTER — Encounter: Payer: Self-pay | Admitting: Nurse Practitioner

## 2024-02-18 VITALS — BP 107/73 | HR 37 | Temp 97.6°F | Ht 65.9 in | Wt 212.8 lb

## 2024-02-18 DIAGNOSIS — Z7984 Long term (current) use of oral hypoglycemic drugs: Secondary | ICD-10-CM | POA: Diagnosis not present

## 2024-02-18 DIAGNOSIS — E119 Type 2 diabetes mellitus without complications: Secondary | ICD-10-CM | POA: Diagnosis not present

## 2024-02-18 DIAGNOSIS — E1159 Type 2 diabetes mellitus with other circulatory complications: Secondary | ICD-10-CM | POA: Diagnosis not present

## 2024-02-18 DIAGNOSIS — E7800 Pure hypercholesterolemia, unspecified: Secondary | ICD-10-CM

## 2024-02-18 DIAGNOSIS — R001 Bradycardia, unspecified: Secondary | ICD-10-CM | POA: Diagnosis not present

## 2024-02-18 DIAGNOSIS — I152 Hypertension secondary to endocrine disorders: Secondary | ICD-10-CM | POA: Diagnosis not present

## 2024-02-18 NOTE — Progress Notes (Signed)
 BP 107/73   Pulse (!) 37   Temp 97.6 F (36.4 C) (Oral)   Ht 5' 5.9 (1.674 m)   Wt 212 lb 12.8 oz (96.5 kg)   BMI 34.45 kg/m    Subjective:    [name], male    DOB: February 01, 1941, 83 y.o.   MRN: [id number]  NOTE WRITTEN BY DNP STUDENT.  ASSESSMENT AND PLAN OF CARE REVIEWED WITH STUDENT, AGREE WITH ABOVE FINDINGS AND PLAN.   Chief Complaint  Patient presents with   Diabetes   Hyperlipidemia   Hypertension   HPI: Jack Crane is a 83 y.o. male here for a follow-up for HTN, DM2, and HLD. Pt reports doing well with medications and denies side effects. Pt denies headache, shortness of breath, headaches, dizziness or lightheadedness. Pt reports sugars have been 160-200 after meals. Denies polydypsia. Pt denies other concerns today. Pt bradycardic.  HYPERTENSION / HYPERLIPIDEMIA Doing well, no concerns at visit today.  Satisfied with current treatment? yes Duration of hypertension: years BP monitoring frequency: not checking BP range:  BP medication side effects: no Past BP meds: none Duration of hyperlipidemia: years Cholesterol medication side effects: no Cholesterol supplements: none Past cholesterol medications: atorvastain (lipitor) Medication compliance: excellent compliance Aspirin: yes Recent stressors: no Recurrent headaches: no Visual changes: no Palpitations: no Dyspnea: no Chest pain: no Lower extremity edema: no Dizzy/lightheaded: no  DIABETES Last A1c 6.5% in Jan. On Metformin  1000mg  BID Hypoglycemic episodes: Polydipsia/polyuria: no Visual disturbance: no Chest pain: no Paresthesias: no Glucose Monitoring: yes  Accucheck frequency: daily  Fasting glucose:   Post prandial:160-200 (after meals)  Evening:  Before meals: Taking Insulin?: no  Long acting insulin:  Short acting insulin: Blood Pressure Monitoring: not checking Retinal Examination: Up to Date Foot Exam: Up to Date Diabetic Education: Not Completed Pneumovax:  Up to Date Influenza: Up to Date Aspirin: yes   Relevant past medical, surgical, family and social history reviewed and updated as indicated. Interim medical history since our last visit reviewed. Allergies and medications reviewed and updated.  Review of Systems  Eyes:  Negative for visual disturbance.  Respiratory:  Negative for chest tightness and shortness of breath.   Cardiovascular:  Negative for chest pain, palpitations and leg swelling.  Endocrine: Negative for polydipsia and polyuria.  Neurological:  Negative for dizziness, light-headedness, numbness and headaches.    Per HPI unless specifically indicated above     Objective:    BP 107/73   Pulse (!) 37   Temp 97.6 F (36.4 C) (Oral)   Ht 5' 5.9 (1.674 m)   Wt 212 lb 12.8 oz (96.5 kg)   BMI 34.45 kg/m   Wt Readings from Last 3 Encounters:  02/18/24 212 lb 12.8 oz (96.5 kg)  11/15/23 212 lb (96.2 kg)  08/14/23 217 lb 6.4 oz (98.6 kg)    Physical Exam Vitals and nursing note reviewed.  Constitutional:      General: He is not in acute distress.    Appearance: Normal appearance. He is obese. He is not ill-appearing, toxic-appearing or diaphoretic.  HENT:     Head: Normocephalic.     Right Ear: External ear normal.     Left Ear: External ear normal.     Nose: Nose normal. No congestion or rhinorrhea.     Mouth/Throat:     Mouth: Mucous membranes are moist.  Eyes:     General:        Right eye: No discharge.  Left eye: No discharge.     Extraocular Movements: Extraocular movements intact.     Conjunctiva/sclera: Conjunctivae normal.     Pupils: Pupils are equal, round, and reactive to light.  Cardiovascular:     Rate and Rhythm: Regular rhythm. Bradycardia present.     Heart sounds: No murmur heard. Pulmonary:     Effort: Pulmonary effort is normal. No respiratory distress.     Breath sounds: Normal breath sounds. No wheezing, rhonchi or rales.  Abdominal:     General: Abdomen is flat. Bowel  sounds are normal.  Musculoskeletal:     Cervical back: Normal range of motion and neck supple.     Right lower leg: No edema.     Left lower leg: No edema.  Skin:    General: Skin is warm and dry.     Capillary Refill: Capillary refill takes less than 2 seconds.  Neurological:     General: No focal deficit present.     Mental Status: He is alert and oriented to person, place, and time.  Psychiatric:        Mood and Affect: Mood normal.        Behavior: Behavior normal.        Thought Content: Thought content normal.        Judgment: Judgment normal.     Results for orders placed or performed in visit on 11/15/23  Microalbumin, Urine Waived   Collection Time: 11/15/23  8:54 AM  Result Value Ref Range   Microalb, Ur Waived 80 (H) 0 - 19 mg/L   Creatinine, Urine Waived 100 10 - 300 mg/dL   Microalb/Creat Ratio 30-300 (H) <30 mg/g  Comprehensive metabolic panel with GFR   Collection Time: 11/15/23  8:55 AM  Result Value Ref Range   Glucose 119 (H) 70 - 99 mg/dL   BUN 16 8 - 27 mg/dL   Creatinine, Ser 8.77 0.76 - 1.27 mg/dL   eGFR 59 (L) >40 fO/fpw/8.26   BUN/Creatinine Ratio 13 10 - 24   Sodium 140 134 - 144 mmol/L   Potassium 4.7 3.5 - 5.2 mmol/L   Chloride 101 96 - 106 mmol/L   CO2 24 20 - 29 mmol/L   Calcium  9.1 8.6 - 10.2 mg/dL   Total Protein 6.9 6.0 - 8.5 g/dL   Albumin 4.3 3.7 - 4.7 g/dL   Globulin, Total 2.6 1.5 - 4.5 g/dL   Bilirubin Total 0.6 0.0 - 1.2 mg/dL   Alkaline Phosphatase 86 44 - 121 IU/L   AST 16 0 - 40 IU/L   ALT 12 0 - 44 IU/L  Hemoglobin A1c   Collection Time: 11/15/23  8:55 AM  Result Value Ref Range   Hgb A1c MFr Bld 6.5 (H) 4.8 - 5.6 %   Est. average glucose Bld gHb Est-mCnc 140 mg/dL  Lipid panel   Collection Time: 11/15/23  8:55 AM  Result Value Ref Range   Cholesterol, Total 124 100 - 199 mg/dL   Triglycerides 80 0 - 149 mg/dL   HDL 39 (L) >60 mg/dL   VLDL Cholesterol Cal 16 5 - 40 mg/dL   LDL Chol Calc (NIH) 69 0 - 99 mg/dL    Chol/HDL Ratio 3.2 0.0 - 5.0 ratio      Assessment & Plan:   Problem List Items Addressed This Visit       Cardiovascular and Mediastinum   Hypertension associated with diabetes (HCC) - Primary   Chronic.  Controlled without medication.  Would benefit  from ACE or ARB for kidney protection, however, patient has had hypotension in the past. Will continue to monitor for elevated blood pressures in the future.  Labs ordered today.  Return to clinic in 3 months for reevaluation.  Call sooner if concerns arise.       Relevant Orders   Comprehensive metabolic panel with GFR     Endocrine   Diabetes mellitus treated with oral medication (HCC)   Chronic, controlled. Last A1c was 6.5%. Sugars are running from 160-200- after eating. Recheck A1c -results to guide further management. Will add Rybelsus or SGLT2 if A1c >7.5%. Appears to be tolerating Metformin  1000 mg PO BID at this time, denies concerns or side effects today Continue current regimen Follow up in 3 months or sooner if concerns arise       Relevant Orders   Hemoglobin A1c     Other   Pure hypercholesterolemia   Chronic.  Controlled with Atorvastatin  40mg  daily.  Refills sent today.  Labs ordered today.  Return to clinic in 3 months for reevaluation.  Call sooner if concerns arise.       Relevant Orders   Lipid panel   Obesity, morbid (HCC)   Recommended eating smaller high protein, low fat meals more frequently and exercising 30 mins a day 5 times a week with a goal of 10-15lb weight loss in the next 3 months.      Other Visit Diagnoses       Bradycardia       Ekg and labs today. Cardiology referral placed, appointment tomorrow. Follow-up in 3 months, sooner if concerns arise.   Relevant Orders   EKG 12-Lead   Ambulatory referral to Cardiology        Follow up plan: Return in about 3 months (around 05/20/2024) for HTN, HLD, DM2 FU.

## 2024-02-18 NOTE — Assessment & Plan Note (Signed)
Chronic.  Controlled with Atorvastatin 40mg  daily.  Refills sent today.  Labs ordered today.  Return to clinic in 3 months for reevaluation.  Call sooner if concerns arise.

## 2024-02-18 NOTE — Assessment & Plan Note (Signed)
 Recommended eating smaller high protein, low fat meals more frequently and exercising 30 mins a day 5 times a week with a goal of 10-15lb weight loss in the next 3 months.

## 2024-02-18 NOTE — Telephone Encounter (Signed)
 Advised patient of message at today's appointment.

## 2024-02-18 NOTE — Assessment & Plan Note (Signed)
 Chronic.  Controlled without medication.  Would benefit from ACE or ARB for kidney protection, however, patient has had hypotension in the past. Will continue to monitor for elevated blood pressures in the future.  Labs ordered today.  Return to clinic in 3 months for reevaluation.  Call sooner if concerns arise.

## 2024-02-18 NOTE — Assessment & Plan Note (Signed)
 Chronic, controlled. Last A1c was 6.5%. Sugars are running from 160-200- after eating. Recheck A1c -results to guide further management. Will add Rybelsus or SGLT2 if A1c >7.5%. Appears to be tolerating Metformin  1000 mg PO BID at this time, denies concerns or side effects today Continue current regimen Follow up in 3 months or sooner if concerns arise

## 2024-02-18 NOTE — Patient Instructions (Signed)
 [facility] at [facility] [address] [place],  [place]  [address]  Get Driving Directions Main: [phone]  Fax: [phone]  Sunday:Closed Monday:8:00 AM - 5:00 PM Tuesday:8:00 AM - 5:00 PM Wednesday:8:00 AM - 5:00 PM Thursday:8:00 AM - 5:00 PM Friday:8:00 AM - 5:00 PM Saturday:

## 2024-02-19 ENCOUNTER — Encounter: Payer: Self-pay | Admitting: Cardiology

## 2024-02-19 ENCOUNTER — Ambulatory Visit: Attending: Cardiology | Admitting: Cardiology

## 2024-02-19 ENCOUNTER — Ambulatory Visit: Payer: Self-pay | Admitting: Nurse Practitioner

## 2024-02-19 ENCOUNTER — Ambulatory Visit

## 2024-02-19 VITALS — BP 130/70 | HR 57 | Ht 69.0 in | Wt 214.1 lb

## 2024-02-19 DIAGNOSIS — E1159 Type 2 diabetes mellitus with other circulatory complications: Secondary | ICD-10-CM

## 2024-02-19 DIAGNOSIS — R0602 Shortness of breath: Secondary | ICD-10-CM | POA: Diagnosis not present

## 2024-02-19 DIAGNOSIS — E7800 Pure hypercholesterolemia, unspecified: Secondary | ICD-10-CM

## 2024-02-19 DIAGNOSIS — R001 Bradycardia, unspecified: Secondary | ICD-10-CM | POA: Diagnosis not present

## 2024-02-19 DIAGNOSIS — I152 Hypertension secondary to endocrine disorders: Secondary | ICD-10-CM

## 2024-02-19 DIAGNOSIS — R5383 Other fatigue: Secondary | ICD-10-CM | POA: Diagnosis not present

## 2024-02-19 LAB — LIPID PANEL
Chol/HDL Ratio: 3 ratio (ref 0.0–5.0)
Cholesterol, Total: 123 mg/dL (ref 100–199)
HDL: 41 mg/dL (ref >39)
LDL Chol Calc (NIH): 67 mg/dL (ref 0–99)
Triglycerides: 73 mg/dL (ref 0–149)
VLDL Cholesterol Cal: 15 mg/dL (ref 5–40)

## 2024-02-19 LAB — COMPREHENSIVE METABOLIC PANEL WITH GFR
ALT: 8 IU/L (ref 0–44)
AST: 16 IU/L (ref 0–40)
Albumin: 4.4 g/dL (ref 3.7–4.7)
Alkaline Phosphatase: 82 IU/L (ref 48–129)
BUN/Creatinine Ratio: 13 (ref 10–24)
BUN: 15 mg/dL (ref 8–27)
Bilirubin Total: 0.8 mg/dL (ref 0.0–1.2)
CO2: 23 mmol/L (ref 20–29)
Calcium: 9.1 mg/dL (ref 8.6–10.2)
Chloride: 105 mmol/L (ref 96–106)
Creatinine, Ser: 1.2 mg/dL (ref 0.76–1.27)
Globulin, Total: 2.4 g/dL (ref 1.5–4.5)
Glucose: 116 mg/dL — ABNORMAL HIGH (ref 70–99)
Potassium: 4.5 mmol/L (ref 3.5–5.2)
Sodium: 142 mmol/L (ref 134–144)
Total Protein: 6.8 g/dL (ref 6.0–8.5)
eGFR: 60 mL/min/1.73 (ref >59)

## 2024-02-19 LAB — HEMOGLOBIN A1C
Est. average glucose Bld gHb Est-mCnc: 143 mg/dL
Hgb A1c MFr Bld: 6.6 % — ABNORMAL HIGH (ref 4.8–5.6)

## 2024-02-19 NOTE — Progress Notes (Signed)
 Cardiology Office Note   Date:  02/19/2024  ID:  Jack Crane, DOB 09/23/1940, MRN [id number] PCP: Melvin Pao, NP  [facility] Providers Cardiologist:  None Cardiology APP:  Gerard Frederick, NP     History of Present Illness Jack Crane is a 83 y.o. male with a past medical history of hypertension, type 2 diabetes, and hyperlipidemia, who is being seen today with referral from the primary care provider for asymptomatic bradycardia.  He was last seen in clinic 02/10/2024 by his primary care provider.  At that time he was doing well.  Was compliant with his medication regimen.  Denies any side effects.  Blood pressure was well-controlled.  Hemoglobin A1c was 6.5.  Discussed the benefit of ACE and ARB therapy for kidney protection however the patient had had a prior history of hypertension in the past.  Blood pressure 1 continued to be monitored and additional medications were deferred.  He was sent in for updated labs.  He was continued on atorvastatin  for his pure hypercholesterolemia.  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   He presents to clinic today stating that overall he has been feeling fairly well.  He denies any chest pain.  Unfortunately he states that he does have fatigue and occasional shortness of breath.  He states that on occasion he does have lightheadedness and dizziness but has not had any syncope or near syncope.  He states several years ago he had some hypotensive episodes where he had severe lightheadedness and dizziness but his antihypertensive medication had to be discontinued.  He stated approximately 10 years ago he had a syncopal episode but he had a large alcohol intake and stopped drinking at that time and had not had an event since then.  He states he was evaluated by his primary  care provider yesterday and was noted to have some abnormal findings in his heart rate or blood pressure and that is how he ended up here today.  He states that he has been doing some work at home in the yard and noticed that of late when he exerts himself he has to stop and rest chest being tired and short of breath.  He states that he has been compliant with his current medication regimen without any undue side effects.  He denies any hospitalizations or visits to the emergency department.  ROS: 10 point review of system has been reviewed and considered negative with exception with the listed in the HPI  Studies Reviewed EKG Interpretation Date/Time:  Wednesday February 19 2024 08:33:02 EDT Ventricular Rate:  57 PR Interval:  160 QRS Duration:  132 QT Interval:  460 QTC Calculation: 447 R Axis:   43  Text Interpretation: Sinus bradycardia with Fusion complexes and junctional escape beats Right bundle branch block T wave abnormality, consider inferior ischemia When compared with ECG of 13-Feb-2023 09:32, Confirmed by Gerard Frederick ([id number]) on 02/19/2024 10:58:51 AM     Risk Assessment/Calculations           Physical Exam VS:  BP 130/70 (BP Location: Right Arm, Patient Position: Sitting, Cuff Size: Normal)   Pulse (!) 57   Ht 5' 9 (1.753 m)   Wt 214 lb 2 oz (97.1 kg)   BMI 31.62 kg/m        Wt Readings from Last  3 Encounters:  02/19/24 214 lb 2 oz (97.1 kg)  02/18/24 212 lb 12.8 oz (96.5 kg)  11/15/23 212 lb (96.2 kg)    GEN: Well nourished, well developed in no acute distress NECK: No JVD; No carotid bruits CARDIAC: RRR, bradycardic, no murmurs, rubs, gallops RESPIRATORY:  Clear with diminished bases to auscultation without rales, wheezing or rhonchi  ABDOMEN: Soft, non-tender, non-distended EXTREMITIES: Trace pretibial edema; No deformity   ASSESSMENT AND PLAN Asymptomatic bradycardia with the patient states that he is doing well where he was found to be bradycardic at  his visit with his primary care provider yesterday.  Today EKG reveals sinus bradycardia with a rate of 57 with fusion complexes and junctional escape beats with a appears to be a chronic right bundle branch block with T wave abnormalities.  He has been placed on a ZIO XT monitor for 2 weeks to rule out arrhythmia or pauses.  He is currently not on any AV nodal blocking agents but will need to avoid those in the future as well.  If his monitor reveals any pauses or he becomes symptomatic we will need referral to EP for evaluation of possible pacemaker placement.  Fatigue with shortness of breath of that is been ongoing for several months.  Could be related to the bradycardia.  He has been scheduled for an echocardiogram to rule out any functional or structural abnormalities.  Hypertension associated with type 2 diabetes with a blood pressure today 128/78 which has been stable.  He is currently not on any antihypertensive medications at this time due to longstanding history of hypotension.  Previously he had been encouraged to start ACE or ARB therapy for kidney protection due to his diabetes but with his history of lightheadedness, dizziness, and prior history of syncope due to hypotension medication changes were deferred at his last visit with his PCP.  Continue to monitor blood pressures on a routine basis.  Any medication changes were deferred today.  Type 2 diabetes with hemoglobin A1c of 6.5 where he is continued on metformin  at 1000 mg twice daily.  Ongoing management per his PCP.  Pure hypercholesterolemia with an LDL of 67.  He is continued on atorvastatin  40 mg daily.  Morbid obesity with a BMI of 31.62.       Dispo: Patient to return to clinic see MD/APP in 6 weeks or sooner if needed for further evaluation.  Signed, Shiara Mcgough, NP

## 2024-02-19 NOTE — Patient Instructions (Signed)
 Medication Instructions:  Your physician recommends that you continue on your current medications as directed. Please refer to the Current Medication list given to you today.   *If you need a refill on your cardiac medications before your next appointment, please call your pharmacy*  Lab Work: No labs ordered today  If you have labs (blood work) drawn today and your tests are completely normal, you will receive your results only by: MyChart Message (if you have MyChart) OR A paper copy in the mail If you have any lab test that is abnormal or we need to change your treatment, we will call you to review the results.  Testing/Procedures: Your physician has requested that you have an echocardiogram. Echocardiography is a painless test that uses sound waves to create images of your heart. It provides your doctor with information about the size and shape of your heart and how well your heart's chambers and valves are working.   You may receive an ultrasound enhancing agent through an IV if needed to better visualize your heart during the echo. This procedure takes approximately one hour.  There are no restrictions for this procedure.  This will take place at [address] [facility], [place] [address]  Please note: We ask at that you not bring children with you during ultrasound (echo/ vascular) testing. Due to room size and safety concerns, children are not allowed in the ultrasound rooms during exams. Our front office staff cannot provide observation of children in our lobby area while testing is being conducted. An adult accompanying a patient to their appointment will only be allowed in the ultrasound room at the discretion of the ultrasound technician under special circumstances. We apologize for any inconvenience.   ZIO XT- Long Term Monitor Instructions  Your physician has requested you wear a ZIO patch monitor for 14 days.  This is a single patch monitor. [employer]  supplies one patch monitor per enrollment. Additional stickers are not available. Please do not apply patch if you will be having a Nuclear Stress Test,  Echocardiogram, Cardiac CT, MRI, or Chest Xray during the period you would be wearing the  monitor. The patch cannot be worn during these tests. You cannot remove and re-apply the  ZIO XT patch monitor.  Your ZIO patch monitor will be mailed 3 day USPS to your address on file. It may take 3-5 days  to receive your monitor after you have been enrolled.  Once you have received your monitor, please review the enclosed instructions. Your monitor  has already been registered assigning a specific monitor serial # to you.  Billing and Patient Assistance Program Information  We have supplied [employer] with any of your insurance information on file for billing purposes. [employer] offers a sliding scale Patient Assistance Program for patients that do not have  insurance, or whose insurance does not completely cover the cost of the ZIO monitor.  You must apply for the Patient Assistance Program to qualify for this discounted rate.  To apply, please call [employer] at [phone], select option 4, select option 2, ask to apply for  Patient Assistance Program. Meredeth will ask your household income, and how many people  are in your household. They will quote your out-of-pocket cost based on that information.  [employer] will also be able to set up a 48-month, interest-free payment plan if needed.  Applying the monitor   Shave hair from upper left chest.  Hold abrader disc by orange tab. Rub abrader in  40 strokes over the upper left chest as  indicated in your monitor instructions.  Clean area with 4 enclosed alcohol pads. Let dry.  Apply patch as indicated in monitor instructions. Patch will be placed under collarbone on left  side of chest with arrow pointing upward.  Rub patch adhesive wings for 2 minutes. Remove white label marked 1. Remove the white   label marked 2. Rub patch adhesive wings for 2 additional minutes.  While looking in a mirror, press and release button in center of patch. A small green light will  flash 3-4 times. This will be your only indicator that the monitor has been turned on.  Do not shower for the first 24 hours. You may shower after the first 24 hours.  Press the button if you feel a symptom. You will hear a small click. Record Date, Time and  Symptom in the Patient Logbook.  When you are ready to remove the patch, follow instructions on the last 2 pages of Patient  Logbook. Stick patch monitor onto the last page of Patient Logbook.  Place Patient Logbook in the blue and white box. Use locking tab on box and tape box closed  securely. The blue and white box has prepaid postage on it. Please place it in the mailbox as  soon as possible. Your physician should have your test results approximately 7 days after the  monitor has been mailed back to [facility].  Call [facility] Customer Care at [phone] if you have questions regarding  your ZIO XT patch monitor. Call them immediately if you see an orange light blinking on your  monitor.  If your monitor falls off in less than 4 days, contact our Monitor department at [phone].  If your monitor becomes loose or falls off after 4 days call [employer] at [phone] for  suggestions on securing your monitor   Follow-Up: At [facility] you and your health needs are our priority.  As part of our continuing mission to provide you with exceptional heart care, our providers are all part of one team.  This team includes your primary Cardiologist (physician) and Advanced Practice Providers or APPs (Physician Assistants and Nurse Practitioners) who all work together to provide you with the care you need, when you need it.  Your next appointment:   6 week(s)  Provider:   You may see one of the following Advanced Practice Providers on your  designated Care Team:   Lonni Meager, NP Lesley Maffucci, PA-C Bernardino Bring, PA-C Cadence Liberty, PA-C Tylene Lunch, NP Barnie Hila, NP

## 2024-02-20 NOTE — Addendum Note (Signed)
 Addended byBETHA GERARD FREDERICK on: 02/20/2024 05:39 PM   Modules accepted: Level of Service

## 2024-03-05 DIAGNOSIS — H40003 Preglaucoma, unspecified, bilateral: Secondary | ICD-10-CM | POA: Diagnosis not present

## 2024-03-12 ENCOUNTER — Telehealth: Payer: Self-pay | Admitting: Cardiology

## 2024-03-12 DIAGNOSIS — H40003 Preglaucoma, unspecified, bilateral: Secondary | ICD-10-CM | POA: Diagnosis not present

## 2024-03-12 DIAGNOSIS — R9431 Abnormal electrocardiogram [ECG] [EKG]: Secondary | ICD-10-CM

## 2024-03-12 DIAGNOSIS — Z961 Presence of intraocular lens: Secondary | ICD-10-CM | POA: Diagnosis not present

## 2024-03-12 DIAGNOSIS — R001 Bradycardia, unspecified: Secondary | ICD-10-CM | POA: Diagnosis not present

## 2024-03-12 DIAGNOSIS — H50112 Monocular exotropia, left eye: Secondary | ICD-10-CM | POA: Diagnosis not present

## 2024-03-12 DIAGNOSIS — H26492 Other secondary cataract, left eye: Secondary | ICD-10-CM | POA: Diagnosis not present

## 2024-03-12 NOTE — Telephone Encounter (Signed)
 Received fax with ZIO results  Printed and given to [facility]

## 2024-03-13 NOTE — Telephone Encounter (Signed)
 ZIO monitoring results reviewed with the DOD Dr. Argentina and Beecher , NP for EP.  There is question as A-fib noted on monitor is actually slow V. tach.  Unfortunately patient's baseline heart rate is bradycardic and we have been unable to start any beta-blocker therapy.  He has an upcoming echocardiogram that has already been scheduled.  Referral is being placed for EP.

## 2024-03-13 NOTE — Telephone Encounter (Signed)
 Fax report given to Tylene Lunch, NP

## 2024-03-13 NOTE — Addendum Note (Signed)
 Addended by: Ireta Pullman D on: 03/13/2024 10:54 AM   Modules accepted: Orders

## 2024-03-14 DIAGNOSIS — R001 Bradycardia, unspecified: Secondary | ICD-10-CM | POA: Diagnosis not present

## 2024-03-16 ENCOUNTER — Ambulatory Visit: Payer: Self-pay | Admitting: Cardiology

## 2024-03-16 NOTE — Progress Notes (Signed)
 Patient has been referred to EP. Monitor previously resulted.

## 2024-03-16 NOTE — Telephone Encounter (Addendum)
-----   Message from Nurse Jon SAUNDERS sent at 03/16/2024 10:32 AM EST ----- Regarding: ep appointment This patient needs to follow up with EP and cancel sheri's appointment in december

## 2024-03-27 ENCOUNTER — Encounter: Payer: Self-pay | Admitting: Nurse Practitioner

## 2024-03-27 ENCOUNTER — Ambulatory Visit: Admitting: Nurse Practitioner

## 2024-03-27 VITALS — BP 129/68 | HR 73 | Temp 97.6°F | Ht 69.02 in | Wt 212.6 lb

## 2024-03-27 DIAGNOSIS — R051 Acute cough: Secondary | ICD-10-CM

## 2024-03-27 DIAGNOSIS — Z23 Encounter for immunization: Secondary | ICD-10-CM

## 2024-03-27 MED ORDER — AMOXICILLIN-POT CLAVULANATE 875-125 MG PO TABS: 1.0000 | ORAL_TABLET | Freq: Two times a day (BID) | ORAL | 0 refills | Status: DC

## 2024-03-27 NOTE — Progress Notes (Signed)
 BP 129/68 (BP Location: Left Arm, Patient Position: Sitting, Cuff Size: Large)   Pulse 73   Temp 97.6 F (36.4 C) (Oral)   Ht 5' 9.02 (1.753 m)   Wt 212 lb 9.6 oz (96.4 kg)   SpO2 92%   BMI 31.38 kg/m    Subjective:    [name], male    DOB: 12-08-1940, 83 y.o.   MRN: [id number]  HPI: Jack Crane is a 83 y.o. male  Chief Complaint  Patient presents with   cold   UPPER RESPIRATORY TRACT INFECTION Worst symptom: Symptoms started last week Fever: no Cough: yes Shortness of breath: no Wheezing: yes Chest pain: no Chest tightness: no Chest congestion: yes Nasal congestion: yes Runny nose: yes Post nasal drip: yes Sneezing: no Sore throat: yes Swollen glands: no Sinus pressure: no Headache: no Face pain: no Toothache: no Ear pain: no bilateral Ear pressure: no bilateral Eyes red/itching:no Eye drainage/crusting: no  Vomiting: no Rash: no Fatigue: yes Sick contacts: no Strep contacts: no  Context: stable Recurrent sinusitis: no Relief with OTC cold/cough medications: no  Treatments attempted: cold/sinus and mucinex   Relevant past medical, surgical, family and social history reviewed and updated as indicated. Interim medical history since our last visit reviewed. Allergies and medications reviewed and updated.  Review of Systems  Constitutional:  Positive for fatigue. Negative for fever.  HENT:  Positive for congestion, postnasal drip and rhinorrhea. Negative for ear pain, sinus pressure, sinus pain, sneezing and sore throat.   Respiratory:  Positive for cough and wheezing. Negative for chest tightness and shortness of breath.   Gastrointestinal:  Negative for vomiting.  Skin:  Negative for rash.  Neurological:  Negative for headaches.    Per HPI unless specifically indicated above     Objective:    BP 129/68 (BP Location: Left Arm, Patient Position: Sitting, Cuff Size: Large)   Pulse 73   Temp 97.6 F (36.4 C) (Oral)   Ht  5' 9.02 (1.753 m)   Wt 212 lb 9.6 oz (96.4 kg)   SpO2 92%   BMI 31.38 kg/m   Wt Readings from Last 3 Encounters:  03/27/24 212 lb 9.6 oz (96.4 kg)  02/19/24 214 lb 2 oz (97.1 kg)  02/18/24 212 lb 12.8 oz (96.5 kg)    Physical Exam Vitals and nursing note reviewed.  Constitutional:      General: He is not in acute distress.    Appearance: Normal appearance. He is not ill-appearing, toxic-appearing or diaphoretic.  HENT:     Head: Normocephalic.     Right Ear: External ear normal. A middle ear effusion is present.     Left Ear: External ear normal. A middle ear effusion is present.     Nose: Rhinorrhea present. No congestion.     Right Sinus: No maxillary sinus tenderness or frontal sinus tenderness.     Left Sinus: No maxillary sinus tenderness or frontal sinus tenderness.     Mouth/Throat:     Mouth: Mucous membranes are moist.     Pharynx: Posterior oropharyngeal erythema present. No oropharyngeal exudate.  Eyes:     General:        Right eye: No discharge.        Left eye: No discharge.     Extraocular Movements: Extraocular movements intact.     Conjunctiva/sclera: Conjunctivae normal.     Pupils: Pupils are equal, round, and reactive to light.  Cardiovascular:     Rate and  Rhythm: Normal rate and regular rhythm.     Heart sounds: No murmur heard. Pulmonary:     Effort: Pulmonary effort is normal. No respiratory distress.     Breath sounds: Normal breath sounds. No wheezing, rhonchi or rales.  Abdominal:     General: Abdomen is flat. Bowel sounds are normal.  Musculoskeletal:     Cervical back: Normal range of motion and neck supple.  Skin:    General: Skin is warm and dry.     Capillary Refill: Capillary refill takes less than 2 seconds.  Neurological:     General: No focal deficit present.     Mental Status: He is alert and oriented to person, place, and time.  Psychiatric:        Mood and Affect: Mood normal.        Behavior: Behavior normal.        Thought  Content: Thought content normal.        Judgment: Judgment normal.     Results for orders placed or performed in visit on 02/18/24  Comprehensive metabolic panel with GFR   Collection Time: 02/18/24  8:49 AM  Result Value Ref Range   Glucose 116 (H) 70 - 99 mg/dL   BUN 15 8 - 27 mg/dL   Creatinine, Ser 8.79 0.76 - 1.27 mg/dL   eGFR 60 >40 fO/fpw/8.26   BUN/Creatinine Ratio 13 10 - 24   Sodium 142 134 - 144 mmol/L   Potassium 4.5 3.5 - 5.2 mmol/L   Chloride 105 96 - 106 mmol/L   CO2 23 20 - 29 mmol/L   Calcium  9.1 8.6 - 10.2 mg/dL   Total Protein 6.8 6.0 - 8.5 g/dL   Albumin 4.4 3.7 - 4.7 g/dL   Globulin, Total 2.4 1.5 - 4.5 g/dL   Bilirubin Total 0.8 0.0 - 1.2 mg/dL   Alkaline Phosphatase 82 48 - 129 IU/L   AST 16 0 - 40 IU/L   ALT 8 0 - 44 IU/L  Hemoglobin A1c   Collection Time: 02/18/24  8:49 AM  Result Value Ref Range   Hgb A1c MFr Bld 6.6 (H) 4.8 - 5.6 %   Est. average glucose Bld gHb Est-mCnc 143 mg/dL  Lipid panel   Collection Time: 02/18/24  8:49 AM  Result Value Ref Range   Cholesterol, Total 123 100 - 199 mg/dL   Triglycerides 73 0 - 149 mg/dL   HDL 41 >60 mg/dL   VLDL Cholesterol Cal 15 5 - 40 mg/dL   LDL Chol Calc (NIH) 67 0 - 99 mg/dL   Chol/HDL Ratio 3.0 0.0 - 5.0 ratio      Assessment & Plan:   Problem List Items Addressed This Visit   None Visit Diagnoses       Acute cough    -  Primary   Will treat with Augmentin  due to symptoms being ongoing x 10 days.  Complete course of medication.  Follow up if symptoms are not improved.     Flu vaccine need       Relevant Orders   Flu vaccine HIGH DOSE PF(Fluzone Trivalent) (Completed)        Follow up plan: Return if symptoms worsen or fail to improve.

## 2024-03-31 ENCOUNTER — Ambulatory Visit: Attending: Cardiology

## 2024-03-31 DIAGNOSIS — R001 Bradycardia, unspecified: Secondary | ICD-10-CM

## 2024-03-31 LAB — ECHOCARDIOGRAM COMPLETE
AR max vel: 3.68 cm2
AV Area VTI: 4.33 cm2
AV Area mean vel: 3.6 cm2
AV Mean grad: 3 mmHg
AV Peak grad: 5 mmHg
Ao pk vel: 1.12 m/s
Area-P 1/2: 2.66 cm2
S' Lateral: 3.61 cm

## 2024-03-31 NOTE — Progress Notes (Signed)
 Heart squeezes noted to be 60-65% which is normal function, top chambers on the right and left side of the heart are mildly dilated, the mitral valve has some mild regurgitation, there is calcification noted at the aortic valve.  No findings to suggest symptoms.

## 2024-04-02 ENCOUNTER — Ambulatory Visit: Admitting: Cardiology

## 2024-04-07 ENCOUNTER — Ambulatory Visit

## 2024-05-21 ENCOUNTER — Encounter: Payer: Self-pay | Admitting: Nurse Practitioner

## 2024-05-21 ENCOUNTER — Ambulatory Visit: Admitting: Nurse Practitioner

## 2024-05-21 VITALS — BP 116/77 | HR 64 | Temp 94.7°F | Ht 69.02 in | Wt 209.6 lb

## 2024-05-21 DIAGNOSIS — Z7984 Long term (current) use of oral hypoglycemic drugs: Secondary | ICD-10-CM

## 2024-05-21 DIAGNOSIS — E1159 Type 2 diabetes mellitus with other circulatory complications: Secondary | ICD-10-CM

## 2024-05-21 DIAGNOSIS — E7800 Pure hypercholesterolemia, unspecified: Secondary | ICD-10-CM | POA: Diagnosis not present

## 2024-05-21 DIAGNOSIS — Z0000 Encounter for general adult medical examination without abnormal findings: Secondary | ICD-10-CM

## 2024-05-21 DIAGNOSIS — I152 Hypertension secondary to endocrine disorders: Secondary | ICD-10-CM | POA: Diagnosis not present

## 2024-05-21 DIAGNOSIS — E119 Type 2 diabetes mellitus without complications: Secondary | ICD-10-CM

## 2024-05-21 LAB — MICROALBUMIN, URINE WAIVED
Creatinine, Urine Waived: 200 mg/dL (ref 10–300)
Microalb, Ur Waived: 150 mg/L — ABNORMAL HIGH (ref 0–19)

## 2024-05-21 MED ORDER — ATORVASTATIN CALCIUM 40 MG PO TABS: 40.0000 mg | ORAL_TABLET | Freq: Every day | ORAL | 1 refills | Status: AC

## 2024-05-21 MED ORDER — METFORMIN HCL 1000 MG PO TABS: ORAL_TABLET | ORAL | 1 refills | Status: AC

## 2024-05-21 NOTE — Progress Notes (Signed)
 "  BP 116/77 (BP Location: Right Arm, Cuff Size: Normal)   Pulse 64   Temp (!) 94.7 F (34.8 C) (Oral)   Ht 5' 9.02 (1.753 m)   Wt 209 lb 9.6 oz (95.1 kg)   SpO2 98%   BMI 30.94 kg/m    Subjective:    [name], male    DOB: 1941-03-18, 84 y.o.   MRN: [id number]  HPI: Jack Crane is a 84 y.o. male presenting on 05/21/2024 for comprehensive medical examination. Current medical complaints include:none  He currently lives with: Interim Problems from his last visit: no  HYPERTENSION / HYPERLIPIDEMIA Doing well, no concerns at visit today.  Blood pressure medications were stopped due to hypotension.  Satisfied with current treatment? yes Duration of hypertension: years BP monitoring frequency: not checking BP range:  BP medication side effects: no Past BP meds: none Duration of hyperlipidemia: years Cholesterol medication side effects: no Cholesterol supplements: none Past cholesterol medications: atorvastain (lipitor) Medication compliance: excellent compliance Aspirin: yes Recent stressors: no Recurrent headaches: no Visual changes: no Palpitations: no Dyspnea: no Chest pain: no Lower extremity edema: no Dizzy/lightheaded: no  DIABETES Last A1c 6.6% in October. On Metformin  1000mg  BID Hypoglycemic episodes: Polydipsia/polyuria: no Visual disturbance: no Chest pain: no Paresthesias: no Glucose Monitoring: yes  Accucheck frequency: daily  Fasting glucose:   Post prandial: 150-180  Evening:  Before meals: Taking Insulin?: no  Long acting insulin:  Short acting insulin: Blood Pressure Monitoring: not checking Retinal Examination: Up to Date Foot Exam: Up to Date Diabetic Education: Not Completed Pneumovax: Up to Date Influenza: Up to Date Aspirin: yes  Depression Screen done today and results listed below:     05/21/2024    8:47 AM 02/18/2024    8:13 AM 11/15/2023    8:49 AM 08/14/2023    9:24 AM 05/14/2023   10:18 AM  Depression  screen PHQ 2/9  Decreased Interest 0 0 0 0 0  Down, Depressed, Hopeless 0 0 0 0 0  PHQ - 2 Score 0 0 0 0 0  Altered sleeping 0 0 0 0   Tired, decreased energy 0 0 0 0   Change in appetite 0 0 0 0   Feeling bad or failure about yourself  0 0 0 0   Trouble concentrating 0 0 0 0   Moving slowly or fidgety/restless 0 0 0 0   Suicidal thoughts 0 0 0 0   PHQ-9 Score 0 0  0  0    Difficult doing work/chores Not difficult at all Not difficult at all Not difficult at all Not difficult at all      Data saved with a previous flowsheet row definition    The patient does not have a history of falls. I did complete a risk assessment for falls. A plan of care for falls was documented.   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 [facility])     Surgical History:  Past Surgical History:  Procedure Laterality Date   APPENDECTOMY     CATARACT EXTRACTION W/PHACO Left 01/02/2023   Procedure: CATARACT EXTRACTION PHACO AND INTRAOCULAR LENS PLACEMENT (IOC) LEFT MILOOP DIABETIC 8.83 00:48.8;  Surgeon: Mittie Gaskin, MD;  Location: [facility];  Service: Ophthalmology;  Laterality: Left;   CATARACT EXTRACTION W/PHACO Right 02/13/2023   Procedure: CATARACT EXTRACTION PHACO AND INTRAOCULAR LENS PLACEMENT (IOC) RIGHT DIABETIC 15.65 01:23.6;  Surgeon: Mittie Gaskin, MD;  Location: [facility];  Service: Ophthalmology;   Laterality: Right;   stomach abcess      Medications:  Current Outpatient Medications on File Prior to Visit  Medication Sig   aspirin 81 MG tablet Take 81 mg by mouth daily.   diclofenac  Sodium (VOLTAREN ) 1 % GEL APPLY 4 G TOPICALLY 4 TIMES A DAY   glucose blood test strip 1 each by Other route as needed for other. Use as instructed   metroNIDAZOLE  (METROGEL ) 0.75 % gel Apply to affected areas face 1-2 times daily as needed for rosacea   No current facility-administered medications on file prior to visit.    Allergies:  Allergies[1]  Social History:  Social History   Socioeconomic History   Marital status: Single    Spouse name: Not on file   Number of children: 0   Years of education: Not on file   Highest education level: 9th grade  Occupational History   Not on file  Tobacco Use   Smoking status: Former    Current packs/day: 0.00    Average packs/day: 1 pack/day for 16.5 years (16.5 ttl pk-yrs)    Types: Cigarettes    Start date: 47    Quit date: 11/02/1974    Years since quitting: 49.5   Smokeless tobacco: Current    Types: Chew  Vaping Use   Vaping status: Never Used  Substance and Sexual Activity   Alcohol use: No    Alcohol/week: 0.0 standard drinks of alcohol   Drug use: No   Sexual activity: Yes    Birth control/protection: None  Other Topics Concern   Not on file  Social History Narrative   Not on file   Social Drivers of Health   Tobacco Use: High Risk (05/21/2024)   Patient History    Smoking Tobacco Use: Former    Smokeless Tobacco Use: Current    Passive Exposure: Not on Actuary Strain: Low Risk (05/14/2023)   Overall Financial Resource Strain (CARDIA)    Difficulty of Paying Living Expenses: Not hard at all  Food Insecurity: No Food Insecurity (05/14/2023)   Hunger Vital Sign    Worried About Running Out of Food in the Last Year: Never true    Ran Out of Food in the Last Year: Never true  Transportation Needs: No  Transportation Needs (05/14/2023)   PRAPARE - Administrator, Civil Service (Medical): No    Lack of Transportation (Non-Medical): No  Physical Activity: Inactive (05/14/2023)   Exercise Vital Sign    Days of Exercise per Week: 0 days    Minutes of Exercise per Session: 0 min  Stress: No Stress Concern Present (05/14/2023)   [employer] of Occupational Health - Occupational Stress Questionnaire    Feeling of Stress : Not at all  Social Connections: Socially Isolated (05/14/2023)   Social Connection and Isolation Panel  Frequency of Communication with Friends and Family: Twice a week    Frequency of Social Gatherings with Friends and Family: More than three times a week    Attends Religious Services: Never    Database Administrator or Organizations: No    Attends Banker Meetings: Never    Marital Status: Never married  Intimate Partner Violence: Not At Risk (05/14/2023)   Humiliation, Afraid, Rape, and Kick questionnaire    Fear of Current or Ex-Partner: No    Emotionally Abused: No    Physically Abused: No    Sexually Abused: No  Depression (PHQ2-9): Low Risk (05/21/2024)   Depression (PHQ2-9)    PHQ-2 Score: 0  Alcohol Screen: Low Risk (05/14/2023)   Alcohol Screen    Last Alcohol Screening Score (AUDIT): 0  Housing: Low Risk (05/14/2023)   Housing Stability Vital Sign    Unable to Pay for Housing in the Last Year: No    Number of Times Moved in the Last Year: 0    Homeless in the Last Year: No  Utilities: Not At Risk (05/14/2023)   AHC Utilities    Threatened with loss of utilities: No  Health Literacy: Adequate Health Literacy (05/14/2023)   B1300 Health Literacy    Frequency of need for help with medical instructions: Never   Tobacco Use History[2] Social History   Substance and Sexual Activity  Alcohol Use No   Alcohol/week: 0.0 standard drinks of alcohol    Family History:  Family History  Problem Relation Age of Onset   Diabetes  Mother    Heart attack Father 46   Cancer Sister        lung   Diabetes Brother    Diabetes Brother    Diabetes Brother     Past medical history, surgical history, medications, allergies, family history and social history reviewed with patient today and changes made to appropriate areas of the chart.   Review of Systems  HENT:         Denies vision changes.  Eyes:  Negative for blurred vision and double vision.  Respiratory:  Negative for shortness of breath.   Cardiovascular:  Negative for chest pain, palpitations and leg swelling.  Neurological:  Negative for dizziness, tingling and headaches.  Endo/Heme/Allergies:  Negative for polydipsia.       Denies Polyuria   All other ROS negative except what is listed above and in the HPI.      Objective:    BP 116/77 (BP Location: Right Arm, Cuff Size: Normal)   Pulse 64   Temp (!) 94.7 F (34.8 C) (Oral)   Ht 5' 9.02 (1.753 m)   Wt 209 lb 9.6 oz (95.1 kg)   SpO2 98%   BMI 30.94 kg/m   Wt Readings from Last 3 Encounters:  05/21/24 209 lb 9.6 oz (95.1 kg)  03/27/24 212 lb 9.6 oz (96.4 kg)  02/19/24 214 lb 2 oz (97.1 kg)    Physical Exam Vitals and nursing note reviewed.  Constitutional:      General: He is not in acute distress.    Appearance: Normal appearance. He is not ill-appearing, toxic-appearing or diaphoretic.  HENT:     Head: Normocephalic.     Right Ear: Tympanic membrane, ear canal and external ear normal.     Left Ear: Tympanic membrane, ear canal and external ear normal.     Nose: Nose normal. No congestion or rhinorrhea.     Mouth/Throat:     Mouth:  Mucous membranes are moist.  Eyes:     General:        Right eye: No discharge.        Left eye: No discharge.     Extraocular Movements: Extraocular movements intact.     Conjunctiva/sclera: Conjunctivae normal.     Pupils: Pupils are equal, round, and reactive to light.  Cardiovascular:     Rate and Rhythm: Normal rate and regular rhythm.     Heart  sounds: No murmur heard. Pulmonary:     Effort: Pulmonary effort is normal. No respiratory distress.     Breath sounds: Normal breath sounds. No wheezing, rhonchi or rales.  Abdominal:     General: Abdomen is flat. Bowel sounds are normal. There is no distension.     Palpations: Abdomen is soft.     Tenderness: There is no abdominal tenderness. There is no guarding.  Musculoskeletal:     Cervical back: Normal range of motion and neck supple.  Skin:    General: Skin is warm and dry.     Capillary Refill: Capillary refill takes less than 2 seconds.  Neurological:     General: No focal deficit present.     Mental Status: He is alert and oriented to person, place, and time.     Cranial Nerves: No cranial nerve deficit.     Motor: No weakness.     Deep Tendon Reflexes: Reflexes normal.  Psychiatric:        Mood and Affect: Mood normal.        Behavior: Behavior normal.        Thought Content: Thought content normal.        Judgment: Judgment normal.     Results for orders placed or performed in visit on 03/31/24  ECHOCARDIOGRAM COMPLETE   Collection Time: 03/31/24  7:44 AM  Result Value Ref Range   AR max vel 3.68 cm2   AV Peak grad 5.0 mmHg   Ao pk vel 1.12 m/s   S' Lateral 3.61 cm   Area-P 1/2 2.66 cm2   AV Area VTI 4.33 cm2   AV Mean grad 3.0 mmHg   AV Area mean vel 3.60 cm2   Est EF 60 - 65%       Assessment & Plan:   Problem List Items Addressed This Visit       Cardiovascular and Mediastinum   Hypertension associated with diabetes (HCC)   Chronic.  Controlled without medication.  Would benefit from ACE or ARB for kidney protection, however, patient has had hypotension in the past. Will continue to monitor for elevated blood pressures in the future.  Labs ordered today.  Return to clinic in 3 months for reevaluation.  Call sooner if concerns arise.       Relevant Medications   atorvastatin  (LIPITOR) 40 MG tablet   metFORMIN  (GLUCOPHAGE ) 1000 MG tablet   Other  Relevant Orders   Comprehensive metabolic panel with GFR     Endocrine   Diabetes mellitus treated with oral medication (HCC)   Chronic, controlled. Last A1c was 6.6%. Sugars are running from 150-180- after eating. Recheck A1c -results to guide further management. Will add Rybelsus or SGLT2 if A1c >7.5%. Appears to be tolerating Metformin  1000 mg PO BID at this time, denies concerns or side effects today Continue current regimen Follow up in 3 months or sooner if concerns arise       Relevant Medications   atorvastatin  (LIPITOR) 40 MG tablet   metFORMIN  (GLUCOPHAGE )  1000 MG tablet   Other Relevant Orders   Hemoglobin A1c   Microalbumin, Urine Waived     Other   Pure hypercholesterolemia   Chronic.  Controlled with Atorvastatin  40mg  daily.  Refills sent today.  Labs ordered today.  Return to clinic in 3 months for reevaluation.  Call sooner if concerns arise.       Relevant Medications   atorvastatin  (LIPITOR) 40 MG tablet   Other Relevant Orders   Lipid panel   Obesity, morbid (HCC)   Recommended eating smaller high protein, low fat meals more frequently and exercising 30 mins a day 5 times a week with a goal of 10-15lb weight loss in the next 3 months.       Relevant Medications   metFORMIN  (GLUCOPHAGE ) 1000 MG tablet   Other Visit Diagnoses       Annual physical exam    -  Primary   Health maintenace reviewe during visit today.  Labs ordered.  Vaccines reviewed.   Relevant Orders   CBC With Diff/Platelet   TSH        Discussed aspirin prophylaxis for myocardial infarction prevention and decision was it was not indicated  LABORATORY TESTING:  Health maintenance labs ordered today as discussed above.    IMMUNIZATIONS:   - Tdap: Tetanus vaccination status reviewed: last tetanus booster within 10 years. - Influenza: Up to date - Pneumovax: Up to date - Prevnar: Up to date - COVID: Not applicable - HPV: Not applicable - Shingrix vaccine: Discussed at visit  today   SCREENING: - Colonoscopy: Not applicable  Discussed with patient purpose of the colonoscopy is to detect colon cancer at curable precancerous or early stages   - AAA Screening: Not applicable  -Hearing Test: Not applicable  -Spirometry: Not applicable   PATIENT COUNSELING:    Sexuality: Discussed sexually transmitted diseases, partner selection, use of condoms, avoidance of unintended pregnancy  and contraceptive alternatives.   Advised to avoid cigarette smoking.  I discussed with the patient that most people either abstain from alcohol or drink within safe limits (<=14/week and <=4 drinks/occasion for males, <=7/weeks and <= 3 drinks/occasion for females) and that the risk for alcohol disorders and other health effects rises proportionally with the number of drinks per week and how often a drinker exceeds daily limits.  Discussed cessation/primary prevention of drug use and availability of treatment for abuse.   Diet: Encouraged to adjust caloric intake to maintain  or achieve ideal body weight, to reduce intake of dietary saturated fat and total fat, to limit sodium intake by avoiding high sodium foods and not adding table salt, and to maintain adequate dietary potassium and calcium  preferably from fresh fruits, vegetables, and low-fat dairy products.    stressed the importance of regular exercise  Injury prevention: Discussed safety belts, safety helmets, smoke detector, smoking near bedding or upholstery.   Dental health: Discussed importance of regular tooth brushing, flossing, and dental visits.   Follow up plan: NEXT PREVENTATIVE PHYSICAL DUE IN 1 YEAR. Return in about 3 months (around 08/19/2024) for HTN, HLD, DM2 FU.     [1] No Known Allergies [2]  Social History Tobacco Use  Smoking Status Former   Current packs/day: 0.00   Average packs/day: 1 pack/day for 16.5 years (16.5 ttl pk-yrs)   Types: Cigarettes   Start date: 66   Quit date: 11/02/1974   Years  since quitting: 49.5  Smokeless Tobacco Current   Types: Chew   "

## 2024-05-21 NOTE — Assessment & Plan Note (Signed)
 Chronic, controlled. Last A1c was 6.6%. Sugars are running from 150-180- after eating. Recheck A1c -results to guide further management. Will add Rybelsus or SGLT2 if A1c >7.5%. Appears to be tolerating Metformin  1000 mg PO BID at this time, denies concerns or side effects today Continue current regimen Follow up in 3 months or sooner if concerns arise

## 2024-05-21 NOTE — Assessment & Plan Note (Signed)
 Chronic.  Controlled without medication.  Would benefit from ACE or ARB for kidney protection, however, patient has had hypotension in the past. Will continue to monitor for elevated blood pressures in the future.  Labs ordered today.  Return to clinic in 3 months for reevaluation.  Call sooner if concerns arise.

## 2024-05-21 NOTE — Assessment & Plan Note (Signed)
Chronic.  Controlled with Atorvastatin 40mg  daily.  Refills sent today.  Labs ordered today.  Return to clinic in 3 months for reevaluation.  Call sooner if concerns arise.

## 2024-05-21 NOTE — Assessment & Plan Note (Signed)
 Recommended eating smaller high protein, low fat meals more frequently and exercising 30 mins a day 5 times a week with a goal of 10-15lb weight loss in the next 3 months.

## 2024-05-22 ENCOUNTER — Other Ambulatory Visit: Payer: Self-pay | Admitting: Nurse Practitioner

## 2024-05-22 ENCOUNTER — Ambulatory Visit: Payer: Self-pay | Admitting: Nurse Practitioner

## 2024-05-22 LAB — COMPREHENSIVE METABOLIC PANEL WITH GFR
ALT: 12 [IU]/L (ref 0–44)
AST: 17 [IU]/L (ref 0–40)
Albumin: 4.2 g/dL (ref 3.7–4.7)
Alkaline Phosphatase: 79 [IU]/L (ref 48–129)
BUN/Creatinine Ratio: 15 (ref 10–24)
BUN: 19 mg/dL (ref 8–27)
Bilirubin Total: 0.6 mg/dL (ref 0.0–1.2)
CO2: 23 mmol/L (ref 20–29)
Calcium: 8.7 mg/dL (ref 8.6–10.2)
Chloride: 104 mmol/L (ref 96–106)
Creatinine, Ser: 1.3 mg/dL — ABNORMAL HIGH (ref 0.76–1.27)
Globulin, Total: 2.5 g/dL (ref 1.5–4.5)
Glucose: 129 mg/dL — ABNORMAL HIGH (ref 70–99)
Potassium: 4.7 mmol/L (ref 3.5–5.2)
Sodium: 142 mmol/L (ref 134–144)
Total Protein: 6.7 g/dL (ref 6.0–8.5)
eGFR: 55 mL/min/{1.73_m2} — ABNORMAL LOW

## 2024-05-22 LAB — HEMOGLOBIN A1C
Est. average glucose Bld gHb Est-mCnc: 146 mg/dL
Hgb A1c MFr Bld: 6.7 % — ABNORMAL HIGH (ref 4.8–5.6)

## 2024-05-22 LAB — LIPID PANEL
Chol/HDL Ratio: 2.9 ratio (ref 0.0–5.0)
Cholesterol, Total: 125 mg/dL (ref 100–199)
HDL: 43 mg/dL
LDL Chol Calc (NIH): 65 mg/dL (ref 0–99)
Triglycerides: 88 mg/dL (ref 0–149)
VLDL Cholesterol Cal: 17 mg/dL (ref 5–40)

## 2024-05-22 LAB — CBC WITH DIFF/PLATELET
Basophils Absolute: 0 10*3/uL (ref 0.0–0.2)
Basos: 1 %
EOS (ABSOLUTE): 0.1 10*3/uL (ref 0.0–0.4)
Eos: 2 %
Hematocrit: 42.3 % (ref 37.5–51.0)
Hemoglobin: 13.7 g/dL (ref 13.0–17.7)
Immature Grans (Abs): 0 10*3/uL (ref 0.0–0.1)
Immature Granulocytes: 0 %
Lymphocytes Absolute: 1.4 10*3/uL (ref 0.7–3.1)
Lymphs: 23 %
MCH: 31.5 pg (ref 26.6–33.0)
MCHC: 32.4 g/dL (ref 31.5–35.7)
MCV: 97 fL (ref 79–97)
Monocytes Absolute: 0.5 10*3/uL (ref 0.1–0.9)
Monocytes: 8 %
Neutrophils Absolute: 3.9 10*3/uL (ref 1.4–7.0)
Neutrophils: 66 %
Platelets: 200 10*3/uL (ref 150–450)
RBC: 4.35 x10E6/uL (ref 4.14–5.80)
RDW: 12.5 % (ref 11.6–15.4)
WBC: 6 10*3/uL (ref 3.4–10.8)

## 2024-05-22 LAB — TSH: TSH: 3.09 u[IU]/mL (ref 0.450–4.500)

## 2024-05-22 NOTE — Telephone Encounter (Unsigned)
 Copied from CRM [id number]. Topic: Clinical - Lab/Test Results >> May 22, 2024  8:31 AM Jack Crane wrote: Reason for CRM: Relayed lab results, no additional questions.

## 2024-05-25 NOTE — Telephone Encounter (Signed)
 Requested Prescriptions  Pending Prescriptions Disp Refills   diclofenac  Sodium (VOLTAREN ) 1 % GEL [Pharmacy Med Name: DICLOFENAC  SODIUM 1% GEL] 100 g 5    Sig: APPLY 4 GRAMS TOPICALLY 4 TIMES A DAY     Analgesics:  Topicals Failed - 05/25/2024 11:39 AM      Failed - Manual Review: Labs are only required if the patient has taken medication for more than 8 weeks.      Failed - Cr in normal range and within 360 days    Creatinine, Ser  Date Value Ref Range Status  05/21/2024 1.30 (H) 0.76 - 1.27 mg/dL Final         Passed - PLT in normal range and within 360 days    Platelets  Date Value Ref Range Status  05/21/2024 200 150 - 450 x10E3/uL Final         Passed - HGB in normal range and within 360 days    Hemoglobin  Date Value Ref Range Status  05/21/2024 13.7 13.0 - 17.7 g/dL Final         Passed - HCT in normal range and within 360 days    Hematocrit  Date Value Ref Range Status  05/21/2024 42.3 37.5 - 51.0 % Final         Passed - eGFR is 30 or above and within 360 days    GFR calc Af Amer  Date Value Ref Range Status  04/20/2020 66 >59 mL/min/1.73 Final    Comment:    **In accordance with recommendations from the NKF-ASN Task force,**   [facility] is in the process of updating its eGFR calculation to the   2021 CKD-EPI creatinine equation that estimates kidney function   without a race variable.    GFR calc non Af Amer  Date Value Ref Range Status  04/20/2020 57 (L) >59 mL/min/1.73 Final   eGFR  Date Value Ref Range Status  05/21/2024 55 (L) >59 mL/min/1.73 Final         Passed - Patient is not pregnant      Passed - Valid encounter within last 12 months    Recent Outpatient Visits           4 days ago Annual physical exam   [facility] Melvin Pao, NP   1 month ago Acute cough   [place] [facility] Melvin Pao, NP   3 months ago Hypertension associated with diabetes [facility])   [facility] [facility] Melvin Pao, NP   6 months ago [name]   [place] [facility] Melvin Pao, NP   9 months ago Hypertension associated with diabetes [facility])   [place] [facility] Melvin Pao, NP       Future Appointments             In 4 months Hester Alm BROCKS, MD [facility]

## 2024-06-02 ENCOUNTER — Ambulatory Visit: Admitting: Cardiology

## 2024-06-11 ENCOUNTER — Ambulatory Visit

## 2024-08-19 ENCOUNTER — Ambulatory Visit: Admitting: Nurse Practitioner

## 2024-10-13 ENCOUNTER — Ambulatory Visit: Admitting: Dermatology
